# Patient Record
Sex: Male | Born: 1990 | Race: White | Hispanic: No | Marital: Single | Smoking: Current every day smoker
Health system: Southern US, Community
[De-identification: ages and names within clinical notes are randomized; demographics above are authoritative.]

## PROBLEM LIST (undated history)

## (undated) DIAGNOSIS — B1920 Unspecified viral hepatitis C without hepatic coma: Secondary | ICD-10-CM

## (undated) DIAGNOSIS — R569 Unspecified convulsions: Secondary | ICD-10-CM

## (undated) HISTORY — PX: HERNIA REPAIR: SHX51

## (undated) HISTORY — PX: FRACTURE SURGERY: SHX138

---
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
## 2012-03-27 ENCOUNTER — Emergency Department (HOSPITAL_COMMUNITY)
Admission: EM | Admit: 2012-03-27 | Discharge: 2012-03-27 | Disposition: A | Discharge status: Home / Self Care | Payer: Self-pay | Attending: Emergency Medicine | Admitting: Emergency Medicine

## 2012-03-27 ENCOUNTER — Encounter (HOSPITAL_COMMUNITY): Payer: Self-pay | Admitting: *Deleted

## 2012-03-27 DIAGNOSIS — H9202 Otalgia, left ear: Secondary | ICD-10-CM

## 2012-03-27 DIAGNOSIS — S00419A Abrasion of unspecified ear, initial encounter: Secondary | ICD-10-CM

## 2012-03-27 DIAGNOSIS — H9209 Otalgia, unspecified ear: Secondary | ICD-10-CM | POA: Insufficient documentation

## 2012-03-27 DIAGNOSIS — F17200 Nicotine dependence, unspecified, uncomplicated: Secondary | ICD-10-CM | POA: Insufficient documentation

## 2012-03-27 MED ORDER — CEPHALEXIN 500 MG PO CAPS
500.0000 mg | ORAL_CAPSULE | Freq: Once | ORAL | Status: AC
  Administered 2012-03-27: 500 mg via ORAL
  Filled 2012-03-27: qty 1

## 2012-03-27 MED ORDER — ANTIPYRINE-BENZOCAINE 5.4-1.4 % OT SOLN
3.0000 [drp] | Freq: Once | OTIC | Status: AC
  Administered 2012-03-27: 4 [drp] via OTIC
  Filled 2012-03-27: qty 10

## 2012-03-27 MED ORDER — CEPHALEXIN 250 MG PO CAPS: 250.0000 mg | ORAL_CAPSULE | Freq: Four times a day (QID) | ORAL | Status: AC

## 2012-03-27 MED ORDER — AMOXICILLIN 250 MG PO CAPS: 500.0000 mg | ORAL_CAPSULE | Freq: Once | ORAL | Status: DC

## 2012-03-27 NOTE — ED Provider Notes (Signed)
History     CSN: [id number]  Arrival date & time 03/27/12  1814   First MD Initiated Contact with Patient 03/27/12 1913      Chief Complaint  Patient presents with  . Otalgia    (Consider location/radiation/quality/duration/timing/severity/associated sxs/prior treatment) Patient is a 21 y.o. male presenting with ear pain. The history is provided by the patient.  Otalgia This is a new problem. The current episode started more than 2 days ago. There is pain in the left ear. The problem occurs constantly. The problem has not changed since onset.There has been no fever. The pain is moderate. Associated symptoms include ear discharge and headaches. Pertinent negatives include no hearing loss, no rhinorrhea, no sore throat, no vomiting, no neck pain, no cough and no rash. Associated symptoms comments: Single episode of bleeding from the left ear one week ago.  Reports occasional drainage from the ear.  Marland Kitchen    History reviewed. No pertinent past medical history.  History reviewed. No pertinent past surgical history.  History reviewed. No pertinent family history.  History  Substance Use Topics  . Smoking status: Current Everyday Smoker  . Smokeless tobacco: Not on file  . Alcohol Use: No      Review of Systems  Constitutional: Negative for fever, activity change and appetite change.  HENT: Positive for ear pain and ear discharge. Negative for hearing loss, congestion, sore throat, facial swelling, rhinorrhea, trouble swallowing, neck pain, neck stiffness, sinus pressure and tinnitus.   Respiratory: Negative for cough.   Gastrointestinal: Negative for vomiting.  Skin: Negative for rash.  Neurological: Positive for headaches. Negative for dizziness, syncope, facial asymmetry, weakness, light-headedness and numbness.  All other systems reviewed and are negative.    Allergies  Review of patient's allergies indicates no known allergies.  Home Medications   Current Outpatient Rx    Name Route Sig Dispense Refill  . ACETAMINOPHEN 500 MG PO TABS Oral Take 1,000 mg by mouth as needed. For pain      BP 126/80  Pulse 90  Temp 98.3 F (36.8 C) (Oral)  Resp 18  Ht 6\' 2"  (1.88 m)  Wt 150 lb (68.04 kg)  BMI 19.26 kg/m2  SpO2 100%  Physical Exam  Nursing note and vitals reviewed. Constitutional: He is oriented to person, place, and time. He appears well-developed and well-nourished. No distress.  HENT:  Head: Normocephalic.  Right Ear: Tympanic membrane and ear canal normal.  Left Ear: Tympanic membrane normal. There is tenderness. No drainage or swelling. No mastoid tenderness. Tympanic membrane is not perforated, not erythematous and not bulging.  Mouth/Throat: Uvula is midline, oropharynx is clear and moist and mucous membranes are normal.       Small papule to left ear canal with dried blood in the canal.  No TM perforation or active bleeding  Eyes: EOM are normal. Pupils are equal, round, and reactive to light.  Neck: Normal range of motion. Neck supple. No thyromegaly present.  Cardiovascular: Normal rate, regular rhythm and normal heart sounds.   No murmur heard. Pulmonary/Chest: Effort normal and breath sounds normal. No stridor.  Lymphadenopathy:    He has no cervical adenopathy.  Neurological: He is alert and oriented to person, place, and time. He exhibits normal muscle tone. Coordination normal.  Skin: Skin is warm and dry.  Psychiatric: He has a normal mood and affect.    ED Course  Procedures (including critical care time)  Labs Reviewed - No data to display No results found.  MDM    Small papule to the left ear canal with dried blood present.  No edema, active bleeding or obvious TM perforation.  Pt is well appearing.  Agrees to f/u with his PMD if needed.  The patient appears reasonably screened and/or stabilized for discharge and I doubt any other medical condition or other [facility] requiring further screening, evaluation, or treatment  in the ED at this time prior to discharge.    Prescribed: Auralgan otic slon (dispensed) keflex       Jamani Bearce L. [place], [place] 03/29/12 2101

## 2012-03-27 NOTE — ED Notes (Addendum)
Pain lt ear and headache for 1 week,  Blood came out of ear last week.

## 2012-03-31 NOTE — ED Provider Notes (Signed)
Medical screening examination/treatment/procedure(s) were performed by non-physician practitioner and as supervising physician I was immediately available for consultation/collaboration.  Flint Melter, MD 03/31/12 [phone]

## 2012-08-09 ENCOUNTER — Emergency Department (HOSPITAL_COMMUNITY)
Admission: EM | Admit: 2012-08-09 | Discharge: 2012-08-10 | Disposition: A | Discharge status: Home / Self Care | Payer: Self-pay | Attending: Emergency Medicine | Admitting: Emergency Medicine

## 2012-08-09 ENCOUNTER — Encounter (HOSPITAL_COMMUNITY): Payer: Self-pay | Admitting: Emergency Medicine

## 2012-08-09 DIAGNOSIS — S20219A Contusion of unspecified front wall of thorax, initial encounter: Secondary | ICD-10-CM | POA: Insufficient documentation

## 2012-08-09 DIAGNOSIS — Y929 Unspecified place or not applicable: Secondary | ICD-10-CM | POA: Insufficient documentation

## 2012-08-09 DIAGNOSIS — W172XXA Fall into hole, initial encounter: Secondary | ICD-10-CM | POA: Insufficient documentation

## 2012-08-09 DIAGNOSIS — Y939 Activity, unspecified: Secondary | ICD-10-CM | POA: Insufficient documentation

## 2012-08-09 DIAGNOSIS — F17200 Nicotine dependence, unspecified, uncomplicated: Secondary | ICD-10-CM | POA: Insufficient documentation

## 2012-08-09 NOTE — ED Notes (Signed)
I fell in a hole, about 7 feet deep yesterday.  I hit a piece of wood on my right side.  It hurts to breathe, eat per pt.

## 2012-08-10 ENCOUNTER — Emergency Department (HOSPITAL_COMMUNITY): Payer: Self-pay

## 2012-08-10 MED ORDER — NAPROXEN 500 MG PO TABS: 500.0000 mg | ORAL_TABLET | Freq: Two times a day (BID) | ORAL | Status: DC

## 2012-08-10 MED ORDER — NAPROXEN 250 MG PO TABS
500.0000 mg | ORAL_TABLET | Freq: Once | ORAL | Status: AC
  Administered 2012-08-10: 500 mg via ORAL
  Filled 2012-08-10: qty 2

## 2012-08-10 NOTE — ED Provider Notes (Signed)
History     CSN: [id number]  Arrival date & time 08/09/12  2229   First MD Initiated Contact with Patient 08/09/12 2358      Chief Complaint  Patient presents with  . Fall    (Consider location/radiation/quality/duration/timing/severity/associated sxs/prior treatment) HPI Comments: 21 year old male who states that yesterday he fell into a 7 foot deep old striking his right anterior lateral rib cage on a large piece of wood at the bottom of the hole. He had acute onset of pain which has been constant, worse with deep breathing but not associated with any bruising abrasions or lacerations of his chest wall. He has been ambulatory without difficulty and denies head injury, neck pain, back pain or extremity pain or injury.  Patient is a 21 y.o. male presenting with fall. The history is provided by the patient and a relative.  Fall    History reviewed. No pertinent past medical history.  History reviewed. No pertinent past surgical history.  No family history on file.  History  Substance Use Topics  . Smoking status: Current Every Day Smoker  . Smokeless tobacco: Not on file  . Alcohol Use: No      Review of Systems  All other systems reviewed and are negative.    Allergies  Review of patient's allergies indicates no known allergies.  Home Medications   Current Outpatient Rx  Name  Route  Sig  Dispense  Refill  . ACETAMINOPHEN 500 MG PO TABS   Oral   Take 1,000 mg by mouth as needed. For pain         . NAPROXEN 500 MG PO TABS   Oral   Take 1 tablet (500 mg total) by mouth 2 (two) times daily with a meal.   30 tablet   0     BP 137/83  Pulse 57  Temp 98.8 F (37.1 C) (Tympanic)  Resp 16  Ht 6\' 2"  (1.88 m)  Wt 160 lb (72.576 kg)  BMI 20.54 kg/m2  SpO2 100%  Physical Exam  Nursing note and vitals reviewed. Constitutional: He appears well-developed and well-nourished. No distress.  HENT:  Head: Normocephalic and atraumatic.  Mouth/Throat:  Oropharynx is clear and moist. No oropharyngeal exudate.       No head injury  no facial tenderness, deformity, malocclusion or hemotympanum.  no battle's sign or racoon eyes.   Eyes: Conjunctivae normal and EOM are normal. Pupils are equal, round, and reactive to light. Right eye exhibits no discharge. Left eye exhibits no discharge. No scleral icterus.  Neck: Normal range of motion. Neck supple. No JVD present. No thyromegaly present.  Cardiovascular: Normal rate, regular rhythm, normal heart sounds and intact distal pulses.  Exam reveals no gallop and no friction rub.   No murmur heard. Pulmonary/Chest: Effort normal and breath sounds normal. No respiratory distress. He has no wheezes. He has no rales. He exhibits tenderness ( focal mild to moderate chest tenderness over the right midaxillary line and anterior axillary line a proximal sixth rib level.).       Normal underlying lung sounds  Abdominal: Soft. Bowel sounds are normal. He exhibits no distension and no mass. There is no tenderness.  Musculoskeletal: Normal range of motion. He exhibits no edema and no tenderness.  Lymphadenopathy:    He has no cervical adenopathy.  Neurological: He is alert. Coordination normal.  Skin: Skin is warm and dry. No rash noted. No erythema.       No abrasions, lacerations or hematomas to  the chest wall, no subcutaneous emphysema  Psychiatric: He has a normal mood and affect. His behavior is normal.    ED Course  Procedures (including critical care time)  Labs Reviewed - No data to display Dg Ribs Unilateral W/chest Right  08/10/2012  *RADIOLOGY REPORT*  Clinical Data: Right rib pain status post trauma.  RIGHT RIBS AND CHEST - 3+ VIEW  Comparison: 10/01/2006  Findings: Lungs are predominately clear.  Cardiomediastinal contours within normal range.  No displaced fracture identified.  IMPRESSION: No displaced rib fracture identified.   Original Report Authenticated By: Jearld Lesch, M.D.       1. Contusion of rib       MDM  The patient overall appears well, he has no hypoxia, normal respiratory work of breathing, normal pulse and normal breath sounds, rule out rib fractures with imaging, Naprosyn ordered for pain.   Well-appearing, I have personally interpreted the right rib x-rays along with the posterior anterior view of the chest and find her to be no signs of pneumothorax, no signs of fracture. Patient has been informed of these results, he will be given an anti-inflammatory for home.     Vida Roller, MD 08/10/12 0200

## 2012-09-04 ENCOUNTER — Emergency Department (HOSPITAL_COMMUNITY)
Admission: EM | Admit: 2012-09-04 | Discharge: 2012-09-04 | Disposition: A | Discharge status: Home / Self Care | Payer: Self-pay | Attending: Emergency Medicine | Admitting: Emergency Medicine

## 2012-09-04 ENCOUNTER — Encounter (HOSPITAL_COMMUNITY): Payer: Self-pay

## 2012-09-04 DIAGNOSIS — K0889 Other specified disorders of teeth and supporting structures: Secondary | ICD-10-CM

## 2012-09-04 DIAGNOSIS — K089 Disorder of teeth and supporting structures, unspecified: Secondary | ICD-10-CM | POA: Insufficient documentation

## 2012-09-04 DIAGNOSIS — F17200 Nicotine dependence, unspecified, uncomplicated: Secondary | ICD-10-CM | POA: Insufficient documentation

## 2012-09-04 MED ORDER — HYDROCODONE-ACETAMINOPHEN 5-325 MG PO TABS
2.0000 | ORAL_TABLET | Freq: Once | ORAL | Status: AC
  Administered 2012-09-04: 2 via ORAL
  Filled 2012-09-04: qty 2

## 2012-09-04 MED ORDER — PENICILLIN V POTASSIUM 500 MG PO TABS: 500.0000 mg | ORAL_TABLET | Freq: Four times a day (QID) | ORAL | Status: AC

## 2012-09-04 MED ORDER — PENICILLIN V POTASSIUM 250 MG PO TABS
500.0000 mg | ORAL_TABLET | Freq: Once | ORAL | Status: AC
  Administered 2012-09-04: 500 mg via ORAL
  Filled 2012-09-04: qty 2

## 2012-09-04 MED ORDER — HYDROCODONE-ACETAMINOPHEN 5-325 MG PO TABS: ORAL_TABLET | ORAL | Status: DC

## 2012-09-04 NOTE — ED Notes (Signed)
"  i have 7 or 8 teeth that are killing me"  Right upper and lower pain

## 2012-09-04 NOTE — ED Notes (Signed)
Pt with upper and lower right teeth with pain x 3-4 months per pt that comes and goes, denies seeing a dentist due to no insurance and attempted to call health dept today but was closed by time pt got home from work per pt

## 2012-09-04 NOTE — ED Provider Notes (Signed)
History     CSN: [id number]  Arrival date & time 09/04/12  2043   First MD Initiated Contact with Patient 09/04/12 2118      Chief Complaint  Patient presents with  . Dental Pain    (Consider location/radiation/quality/duration/timing/severity/associated sxs/prior treatment) Patient is a 22 y.o. male presenting with tooth pain. The history is provided by the patient.  Dental PainThe primary symptoms include mouth pain. Primary symptoms do not include dental injury, oral bleeding, oral lesions, headaches, fever, shortness of breath, sore throat, angioedema or cough. The symptoms began more than 1 week ago. The symptoms are worsening. The symptoms are new. The symptoms occur constantly.  Mouth pain began more than 1 week ago. Mouth pain occurs constantly. Mouth pain is worsening. Affected locations include: teeth and gum(s).  Additional symptoms include: dental sensitivity to temperature, gum swelling and gum tenderness. Additional symptoms do not include: purulent gums, trismus, jaw pain, facial swelling, trouble swallowing, pain with swallowing, drooling, ear pain and swollen glands. Medical issues include: smoking and periodontal disease.    History reviewed. No pertinent past medical history.  History reviewed. No pertinent past surgical history.  No family history on file.  History  Substance Use Topics  . Smoking status: Current Every Day Smoker -- 1.0 packs/day    Types: Cigarettes  . Smokeless tobacco: Not on file  . Alcohol Use: No      Review of Systems  Constitutional: Negative for fever and appetite change.  HENT: Positive for dental problem. Negative for ear pain, congestion, sore throat, facial swelling, drooling, trouble swallowing, neck pain and neck stiffness.   Eyes: Negative for pain and visual disturbance.  Respiratory: Negative for cough and shortness of breath.   Neurological: Negative for dizziness, facial asymmetry and headaches.  Hematological:  Negative for adenopathy.  All other systems reviewed and are negative.    Allergies  Review of patient's allergies indicates no known allergies.  Home Medications   Current Outpatient Rx  Name  Route  Sig  Dispense  Refill  . ACETAMINOPHEN 500 MG PO TABS   Oral   Take 1,000 mg by mouth as needed. For pain         . HYDROCODONE-ACETAMINOPHEN 5-325 MG PO TABS   Oral   Take 1 tablet by mouth every 6 (six) hours as needed.           BP 129/96  Pulse 75  Temp 98.1 F (36.7 C) (Oral)  Resp 20  Ht 6\' 3"  (1.905 m)  Wt 160 lb (72.576 kg)  BMI 20.00 kg/m2  SpO2 100%  Physical Exam  Nursing note and vitals reviewed. Constitutional: He is oriented to person, place, and time. He appears well-developed and well-nourished. No distress.  HENT:  Head: Normocephalic and atraumatic. No trismus in the jaw.  Right Ear: Tympanic membrane and ear canal normal.  Left Ear: Tympanic membrane and ear canal normal.  Mouth/Throat: Uvula is midline, oropharynx is clear and moist and mucous membranes are normal. Dental caries present. No dental abscesses or uvula swelling.         Widespread dental decay and ttp of the surrounding gums.  Mild erythema of the gums.  No obvious dental abscess, trismus or facial edema  Neck: Normal range of motion. Neck supple.  Cardiovascular: Normal rate, regular rhythm, normal heart sounds and intact distal pulses.   No murmur heard. Pulmonary/Chest: Effort normal and breath sounds normal.  Musculoskeletal: Normal range of motion.  Lymphadenopathy:    He  has no cervical adenopathy.  Neurological: He is alert and oriented to person, place, and time. He exhibits normal muscle tone. Coordination normal.  Skin: Skin is warm and dry.    ED Course  Procedures (including critical care time)  Labs Reviewed - No data to display No results found.      MDM    Pt agrees to f/u with his dentist this week.    Prescribed: norco #15 Pen  VK      Schon Zeiders L. [place], [place] 09/04/12 2143

## 2012-09-04 NOTE — ED Provider Notes (Signed)
Medical screening examination/treatment/procedure(s) were performed by non-physician practitioner and as supervising physician I was immediately available for consultation/collaboration.   Demetrias Goodbar L Othel Dicostanzo, MD 09/04/12 2218 

## 2013-01-30 ENCOUNTER — Encounter (HOSPITAL_COMMUNITY): Payer: Self-pay

## 2013-01-30 ENCOUNTER — Emergency Department (HOSPITAL_COMMUNITY)
Admission: EM | Admit: 2013-01-30 | Discharge: 2013-01-31 | Discharge status: Against Medical Advice | Payer: Self-pay | Attending: Emergency Medicine | Admitting: Emergency Medicine

## 2013-01-30 DIAGNOSIS — R569 Unspecified convulsions: Secondary | ICD-10-CM | POA: Insufficient documentation

## 2013-01-30 DIAGNOSIS — F17200 Nicotine dependence, unspecified, uncomplicated: Secondary | ICD-10-CM | POA: Insufficient documentation

## 2013-01-30 NOTE — ED Provider Notes (Signed)
History     CSN: [id number]  Arrival date & time 01/30/13  2324   First MD Initiated Contact with Patient 01/30/13 2334      Chief Complaint  Patient presents with  . Loss of Consciousness    (Consider location/radiation/quality/duration/timing/severity/associated sxs/prior treatment) HPI HPI Comments: George Lucas is a 22 y.o. male brought in by ambulance, who presents to the Emergency Department complaining of seizure. Patient has not had a seizure since he was small. He snorted vidocin earlier tonight and took some xanax. He had a seizure. His neck is hurting when he moves his head.   History reviewed. No pertinent past medical history.  History reviewed. No pertinent past surgical history.  No family history on file.  History  Substance Use Topics  . Smoking status: Current Every Day Smoker -- 0.50 packs/day for 10 years    Types: Cigarettes  . Smokeless tobacco: Not on file  . Alcohol Use: No      Review of Systems  Constitutional: Negative for fever.       10 Systems reviewed and are negative for acute change except as noted in the HPI.  HENT: Negative for congestion.   Eyes: Negative for discharge and redness.  Respiratory: Negative for cough and shortness of breath.   Cardiovascular: Negative for chest pain.  Gastrointestinal: Negative for vomiting and abdominal pain.  Musculoskeletal: Negative for back pain.  Skin: Negative for rash.  Neurological: Positive for seizures. Negative for syncope, numbness and headaches.  Psychiatric/Behavioral:       No behavior change.    Allergies  Review of patient's allergies indicates no known allergies.  Home Medications   Current Outpatient Rx  Name  Route  Sig  Dispense  Refill  . acetaminophen (TYLENOL) 500 MG tablet   Oral   Take 1,000 mg by mouth as needed. For pain         . HYDROcodone-acetaminophen (NORCO/VICODIN) 5-325 MG per tablet   Oral   Take 1 tablet by mouth every 6 (six) hours as  needed.         Marland Kitchen HYDROcodone-acetaminophen (NORCO/VICODIN) 5-325 MG per tablet      Take one-two tabs po q 4-6 hrs prn pain   15 tablet   0     BP 119/70  Pulse 80  Temp(Src) 98.1 F (36.7 C) (Oral)  Resp 20  Ht 6\' 3"  (1.905 m)  Wt 160 lb (72.576 kg)  BMI 20 kg/m2  SpO2 99%  Physical Exam  Nursing note and vitals reviewed. Constitutional: He appears well-developed and well-nourished.  Awake, alert, nontoxic appearance.  HENT:  Head: Normocephalic and atraumatic.  Eyes: EOM are normal. Pupils are equal, round, and reactive to light.  Neck: Neck supple.  Cardiovascular: Normal rate.   Pulmonary/Chest: Effort normal and breath sounds normal. He exhibits no tenderness.  Abdominal: Soft. Bowel sounds are normal. There is no tenderness. There is no rebound.  Musculoskeletal: He exhibits no tenderness.  Baseline ROM, no obvious new focal weakness.  Neurological:  Mental status and motor strength appears baseline for patient and situation.  Skin: No rash noted.  Psychiatric: He has a normal mood and affect.    ED Course  Procedures (including critical care time) Results for orders placed during the hospital encounter of 01/30/13  URINE RAPID DRUG SCREEN (HOSP PERFORMED)      Result Value Range   Opiates POSITIVE (*) NONE DETECTED   Cocaine NONE DETECTED  NONE DETECTED   Benzodiazepines POSITIVE (*)  NONE DETECTED   Amphetamines NONE DETECTED  NONE DETECTED   Tetrahydrocannabinol POSITIVE (*) NONE DETECTED   Barbiturates NONE DETECTED  NONE DETECTED  ETHANOL      Result Value Range   Alcohol, Ethyl (B) <11  0 - 11 mg/dL         MDM  Patient with a seizure disorder as a child had a seizure tonight in the setting of using drugs. He did not stay for his CT of his head and neck. He left AMA.  discussed risk of death/disability of leaving against medical advice and the patient accepts these risks.  The patient is awake/alet able to make decisions, and not  intoxicated Patient discharged against medical advice.  MDM Reviewed: nursing note and vitals Interpretation: labs           Nicoletta Dress. [place], MD 01/31/13 1610

## 2013-01-30 NOTE — ED Notes (Signed)
Per rescue, pt found in his parents home, lethargic, tremulous. Admitted drug use today of  1-norco10mg , and 2mg  xanax. girlfriend say he had a seizure. Pt states he's unsure what happened. Rescue reports copius intoxicants on all other persons in home.

## 2013-01-31 ENCOUNTER — Emergency Department (HOSPITAL_COMMUNITY): Payer: Self-pay

## 2013-01-31 LAB — RAPID URINE DRUG SCREEN, HOSP PERFORMED
Amphetamines: NOT DETECTED
Barbiturates: NOT DETECTED
Benzodiazepines: POSITIVE — AB
Opiates: POSITIVE — AB
Tetrahydrocannabinol: POSITIVE — AB

## 2013-01-31 LAB — ETHANOL: Alcohol, Ethyl (B): <11 mg/dL (ref 0–11)

## 2013-01-31 NOTE — ED Notes (Signed)
Pt has refused to wait any longer. States his neck does not hurt anymore and he has to go to work in the morning. I explained what AMA is and the risk of death and/or disability from an undetermined cause. Also informed pt that he may return at anytime for any reason. Pt signed AMA form. (downtime). Instructed pt to increase fluid intake today. NO drugs/alcohol. return for recurrent loss of consciousness , dizziness, n/v etc. Left easily ambulatory with girlfriend

## 2013-02-03 DIAGNOSIS — R569 Unspecified convulsions: Secondary | ICD-10-CM

## 2013-06-18 ENCOUNTER — Encounter (HOSPITAL_COMMUNITY): Payer: Self-pay | Admitting: Emergency Medicine

## 2013-06-18 ENCOUNTER — Emergency Department (HOSPITAL_COMMUNITY)
Admission: EM | Admit: 2013-06-18 | Discharge: 2013-06-18 | Disposition: A | Discharge status: Home / Self Care | Payer: Self-pay | Attending: Emergency Medicine | Admitting: Emergency Medicine

## 2013-06-18 DIAGNOSIS — K029 Dental caries, unspecified: Secondary | ICD-10-CM

## 2013-06-18 DIAGNOSIS — K089 Disorder of teeth and supporting structures, unspecified: Secondary | ICD-10-CM | POA: Insufficient documentation

## 2013-06-18 DIAGNOSIS — F17200 Nicotine dependence, unspecified, uncomplicated: Secondary | ICD-10-CM | POA: Insufficient documentation

## 2013-06-18 MED ORDER — OXYCODONE-ACETAMINOPHEN 5-325 MG PO TABS
1.0000 | ORAL_TABLET | Freq: Once | ORAL | Status: AC
  Administered 2013-06-18: 1 via ORAL
  Filled 2013-06-18: qty 1

## 2013-06-18 MED ORDER — NAPROXEN 500 MG PO TABS: 500.0000 mg | ORAL_TABLET | Freq: Two times a day (BID) | ORAL | Status: DC

## 2013-06-18 MED ORDER — AMOXICILLIN 250 MG PO CAPS
500.0000 mg | ORAL_CAPSULE | Freq: Once | ORAL | Status: AC
Start: 2013-06-18 — End: 2013-06-18
  Administered 2013-06-18: 500 mg via ORAL
  Filled 2013-06-18: qty 2

## 2013-06-18 MED ORDER — AMOXICILLIN 500 MG PO CAPS: 500.0000 mg | ORAL_CAPSULE | Freq: Three times a day (TID) | ORAL | Status: DC

## 2013-06-18 NOTE — ED Notes (Signed)
Mult dental caries, but increased pain in upper front teeth.

## 2013-06-18 NOTE — ED Provider Notes (Signed)
CSN: [id number]     Arrival date & time 06/18/13  1534 History   First MD Initiated Contact with Patient 06/18/13 1600     Chief Complaint  Patient presents with  . Dental Pain   (Consider location/radiation/quality/duration/timing/severity/associated sxs/prior Treatment) Patient is a 22 y.o. male presenting with tooth pain. The history is provided by the patient.  Dental Pain Location:  Upper Upper teeth location:  7/RU lateral incisor, 6/RU cuspid and 5/RU 1st bicuspid Quality:  Throbbing Severity:  Severe Onset quality:  Gradual Duration:  3 months Timing:  Constant Progression:  Worsening Chronicity:  Chronic Context: dental caries and poor dentition   Relieved by:  Nothing Worsened by:  Cold food/drink Associated symptoms: gum swelling   Risk factors: smoking    EBB CARELOCK is a 22 y.o. male who presents to the ED with dental pain. The pain has been going on x 3 months. He took 2 ibuprofen last night and 2 this morning. He does not have money to see a Education officer, community.  History reviewed. No pertinent past medical history. Past Surgical History  Procedure Laterality Date  . Fracture surgery     History reviewed. No pertinent family history. History  Substance Use Topics  . Smoking status: Current Every Day Smoker -- 0.50 packs/day for 10 years    Types: Cigarettes  . Smokeless tobacco: Not on file  . Alcohol Use: No    Review of Systems  Allergies  Review of patient's allergies indicates no known allergies.  Home Medications   Current Outpatient Rx  Name  Route  Sig  Dispense  Refill  . acetaminophen (TYLENOL) 500 MG tablet   Oral   Take 1,000 mg by mouth as needed. For pain         . HYDROcodone-acetaminophen (NORCO/VICODIN) 5-325 MG per tablet   Oral   Take 1 tablet by mouth every 6 (six) hours as needed.         Marland Kitchen HYDROcodone-acetaminophen (NORCO/VICODIN) 5-325 MG per tablet      Take one-two tabs po q 4-6 hrs prn pain   15 tablet   0    BP  111/75  Pulse 129  Temp(Src) 98.8 F (37.1 C) (Oral)  Resp 20  Ht 6\' 2"  (1.88 m)  Wt 160 lb (72.576 kg)  BMI 20.53 kg/m2  SpO2 100% Physical Exam  Nursing note and vitals reviewed. Constitutional: He is oriented to person, place, and time. He appears well-developed and well-nourished. No distress.  HENT:  Mouth/Throat: Uvula is midline, oropharynx is clear and moist and mucous membranes are normal.    Multiple dental caries. Tender upper right dental area.  Eyes: EOM are normal.  Neck: Neck supple.  Pulmonary/Chest: Effort normal.  Abdominal: Soft. There is no tenderness.  Musculoskeletal: Normal range of motion.  Neurological: He is alert and oriented to person, place, and time. No cranial nerve deficit.  Skin: Skin is warm and dry.    ED Course  Procedures MDM  22 y.o. male with chronic dental pain and decay. Will treat with antibiotics and pain medication and have patient follow up with the dental clinic as soon as possible. Patient stable for discharge home without any immediate complications.  Discussed with the patient and all questioned fully answered.   Medication List    STOP taking these medications       ibuprofen 200 MG tablet  Commonly known as:  ADVIL,MOTRIN      TAKE these medications  amoxicillin 500 MG capsule  Commonly known as:  AMOXIL  Take 1 capsule (500 mg total) by mouth 3 (three) times daily.     naproxen 500 MG tablet  Commonly known as:  NAPROSYN  Take 1 tablet (500 mg total) by mouth 2 (two) times daily.      ASK your doctor about these medications       acetaminophen 500 MG tablet  Commonly known as:  TYLENOL  Take 1,000 mg by mouth as needed. For pain            Janne Napoleon, NP 06/18/13 1700

## 2013-06-19 NOTE — Progress Notes (Signed)
ED/CM noted patient did not have health insurance and/or PCP listed in the computer.  Patient was given the [place] resource handout with information on the clinics, food pantries, and the handout for new health insurance sign-up.  Patient expressed appreciation for this. 

## 2013-06-19 NOTE — ED Provider Notes (Signed)
Medical screening examination/treatment/procedure(s) were performed by non-physician practitioner and as supervising physician I was immediately available for consultation/collaboration.  EKG Interpretation   None         Lorelee Mclaurin M Onesha Krebbs, MD 06/19/13 0027 

## 2013-07-30 ENCOUNTER — Encounter (HOSPITAL_COMMUNITY): Payer: Self-pay | Admitting: Emergency Medicine

## 2013-07-30 ENCOUNTER — Emergency Department (HOSPITAL_COMMUNITY)
Admission: EM | Admit: 2013-07-30 | Discharge: 2013-07-30 | Disposition: A | Discharge status: Home / Self Care | Payer: Self-pay | Attending: Emergency Medicine | Admitting: Emergency Medicine

## 2013-07-30 DIAGNOSIS — K1370 Unspecified lesions of oral mucosa: Secondary | ICD-10-CM | POA: Insufficient documentation

## 2013-07-30 DIAGNOSIS — K029 Dental caries, unspecified: Secondary | ICD-10-CM | POA: Insufficient documentation

## 2013-07-30 DIAGNOSIS — F17200 Nicotine dependence, unspecified, uncomplicated: Secondary | ICD-10-CM | POA: Insufficient documentation

## 2013-07-30 DIAGNOSIS — K047 Periapical abscess without sinus: Secondary | ICD-10-CM | POA: Insufficient documentation

## 2013-07-30 DIAGNOSIS — G8929 Other chronic pain: Secondary | ICD-10-CM | POA: Insufficient documentation

## 2013-07-30 DIAGNOSIS — Z8669 Personal history of other diseases of the nervous system and sense organs: Secondary | ICD-10-CM | POA: Insufficient documentation

## 2013-07-30 HISTORY — DX: Unspecified convulsions: R56.9

## 2013-07-30 MED ORDER — HYDROCODONE-ACETAMINOPHEN 5-325 MG PO TABS
1.0000 | ORAL_TABLET | Freq: Once | ORAL | Status: AC
  Administered 2013-07-30: 1 via ORAL
  Filled 2013-07-30: qty 1

## 2013-07-30 MED ORDER — NAPROXEN 500 MG PO TABS: 500.0000 mg | ORAL_TABLET | Freq: Two times a day (BID) | ORAL | Status: DC

## 2013-07-30 MED ORDER — AMOXICILLIN 500 MG PO CAPS: 500.0000 mg | ORAL_CAPSULE | Freq: Three times a day (TID) | ORAL | Status: DC

## 2013-07-30 NOTE — ED Notes (Signed)
Pt states has had tooth ache for the last 2 days.

## 2013-07-30 NOTE — ED Provider Notes (Signed)
CSN: [id number]     Arrival date & time 07/30/13  1540 History   First MD Initiated Contact with Patient 07/30/13 1604     Chief Complaint  Patient presents with  . Dental Pain   (Consider location/radiation/quality/duration/timing/severity/associated sxs/prior Treatment) Patient is a 22 y.o. male presenting with tooth pain. The history is provided by the patient.  Dental Pain Location:  Upper Upper teeth location:  6/RU cuspid Quality:  Throbbing and constant Severity:  Severe Onset quality:  Gradual Duration:  2 days Timing:  Constant Progression:  Worsening Chronicity:  Chronic Context: abscess   Relieved by:  Nothing Worsened by:  Cold food/drink, touching and pressure Ineffective treatments:  NSAIDs Associated symptoms: gum swelling   Associated symptoms: no difficulty swallowing and no trismus      Past Medical History  Diagnosis Date  . Seizures    Past Surgical History  Procedure Laterality Date  . Fracture surgery     Family History  Problem Relation Age of Onset  . Seizures Mother   . Seizures Father    History  Substance Use Topics  . Smoking status: Current Every Day Smoker -- 0.50 packs/day for 10 years    Types: Cigarettes  . Smokeless tobacco: Not on file  . Alcohol Use: 0.6 oz/week    1 Cans of beer per week     Comment: Occasional    Review of Systems Negative except as stated in HPI  Allergies  Review of patient's allergies indicates no known allergies.  Home Medications   Current Outpatient Rx  Name  Route  Sig  Dispense  Refill  . acetaminophen (TYLENOL) 500 MG tablet   Oral   Take 1,000 mg by mouth as needed. For pain         . amoxicillin (AMOXIL) 500 MG capsule   Oral   Take 1 capsule (500 mg total) by mouth 3 (three) times daily.   21 capsule   0   . naproxen (NAPROSYN) 500 MG tablet   Oral   Take 1 tablet (500 mg total) by mouth 2 (two) times daily.   20 tablet   0    BP 119/77  Pulse 76  Temp(Src) 98.4 F (36.9  C) (Oral)  Resp 16  Ht 6' (1.829 m)  Wt 165 lb (74.844 kg)  BMI 22.37 kg/m2  SpO2 99% Physical Exam  Nursing note and vitals reviewed. Constitutional: He is oriented to person, place, and time. He appears well-developed and well-nourished.  HENT:  Head: Normocephalic and atraumatic.  Mouth/Throat: Uvula is midline, oropharynx is clear and moist and mucous membranes are normal. Dental abscesses and dental caries present.    Eyes: Conjunctivae and EOM are normal.  Neck: Neck supple.  Cardiovascular: Normal rate and regular rhythm.   Pulmonary/Chest: Effort normal and breath sounds normal.  Musculoskeletal: Normal range of motion.  Lymphadenopathy:    He has no cervical adenopathy.  Neurological: He is alert and oriented to person, place, and time. No cranial nerve deficit.  Skin: Skin is warm and dry.  Psychiatric: He has a normal mood and affect. His behavior is normal.    ED Course  Procedures  MDM  22 y.o. male with dental abscess. Discussed with the patient need for follow up with dentist as soon as possible. Will treat for infection and pain. Patient stable for discharge without any immediate complications.  Discussed with the patient and all questioned fully answered.   Medication List    TAKE these medications  amoxicillin 500 MG capsule  Commonly known as:  AMOXIL  Take 1 capsule (500 mg total) by mouth 3 (three) times daily.     naproxen 500 MG tablet  Commonly known as:  NAPROSYN  Take 1 tablet (500 mg total) by mouth 2 (two) times daily.      ASK your doctor about these medications       acetaminophen 500 MG tablet  Commonly known as:  TYLENOL  Take 1,000 mg by mouth as needed. For pain           Janne Napoleon, NP 07/30/13 [phone]

## 2013-07-30 NOTE — ED Notes (Signed)
Pain rt upper front tooth, Pt has mult dental caries ,

## 2013-08-01 NOTE — ED Provider Notes (Signed)
Medical screening examination/treatment/procedure(s) were performed by non-physician practitioner and as supervising physician I was immediately available for consultation/collaboration.  EKG Interpretation   None         Serenity Batley M Emmylou Bieker, DO 08/01/13 1735 

## 2013-12-22 ENCOUNTER — Emergency Department (HOSPITAL_COMMUNITY): Payer: Self-pay

## 2013-12-22 ENCOUNTER — Emergency Department (HOSPITAL_COMMUNITY)
Admission: EM | Admit: 2013-12-22 | Discharge: 2013-12-22 | Disposition: A | Discharge status: Home / Self Care | Payer: Self-pay | Attending: Emergency Medicine | Admitting: Emergency Medicine

## 2013-12-22 ENCOUNTER — Encounter (HOSPITAL_COMMUNITY): Payer: Self-pay | Admitting: Emergency Medicine

## 2013-12-22 DIAGNOSIS — Z8669 Personal history of other diseases of the nervous system and sense organs: Secondary | ICD-10-CM | POA: Insufficient documentation

## 2013-12-22 DIAGNOSIS — S92912A Unspecified fracture of left toe(s), initial encounter for closed fracture: Secondary | ICD-10-CM

## 2013-12-22 DIAGNOSIS — W2209XA Striking against other stationary object, initial encounter: Secondary | ICD-10-CM | POA: Insufficient documentation

## 2013-12-22 DIAGNOSIS — Y92009 Unspecified place in unspecified non-institutional (private) residence as the place of occurrence of the external cause: Secondary | ICD-10-CM | POA: Insufficient documentation

## 2013-12-22 DIAGNOSIS — Y9301 Activity, walking, marching and hiking: Secondary | ICD-10-CM | POA: Insufficient documentation

## 2013-12-22 DIAGNOSIS — S92919A Unspecified fracture of unspecified toe(s), initial encounter for closed fracture: Secondary | ICD-10-CM | POA: Insufficient documentation

## 2013-12-22 DIAGNOSIS — F17200 Nicotine dependence, unspecified, uncomplicated: Secondary | ICD-10-CM | POA: Insufficient documentation

## 2013-12-22 DIAGNOSIS — Z8781 Personal history of (healed) traumatic fracture: Secondary | ICD-10-CM | POA: Insufficient documentation

## 2013-12-22 MED ORDER — DICLOFENAC SODIUM 75 MG PO TBEC: 75.0000 mg | DELAYED_RELEASE_TABLET | Freq: Two times a day (BID) | ORAL | Status: DC

## 2013-12-22 MED ORDER — ONDANSETRON HCL 4 MG PO TABS
4.0000 mg | ORAL_TABLET | Freq: Once | ORAL | Status: AC
  Administered 2013-12-22: 4 mg via ORAL
  Filled 2013-12-22: qty 1

## 2013-12-22 MED ORDER — HYDROCODONE-ACETAMINOPHEN 7.5-325 MG PO TABS: 1.0000 | ORAL_TABLET | ORAL | Status: DC | PRN

## 2013-12-22 MED ORDER — KETOROLAC TROMETHAMINE 10 MG PO TABS
10.0000 mg | ORAL_TABLET | Freq: Once | ORAL | Status: AC
  Administered 2013-12-22: 10 mg via ORAL
  Filled 2013-12-22: qty 1

## 2013-12-22 MED ORDER — HYDROCODONE-ACETAMINOPHEN 5-325 MG PO TABS
2.0000 | ORAL_TABLET | Freq: Once | ORAL | Status: AC
  Administered 2013-12-22: 2 via ORAL
  Filled 2013-12-22: qty 2

## 2013-12-22 NOTE — ED Provider Notes (Signed)
CSN: [id number]     Arrival date & time 12/22/13  0830 History   First MD Initiated Contact with Patient 12/22/13 [phone]     Chief Complaint  Patient presents with  . Toe Injury     (Consider location/radiation/quality/duration/timing/severity/associated sxs/prior Treatment) HPI Comments: Patient is a 23 year old male who presents to the emergency department with complaint of toe pain involving the right foot. The patient states that on last evening he was walking through the house barefooted. He accidentally hit his toe on a heater, and has had pain and swelling since that time. The patient states this morning the pain of the toe felt like it was throbbing and fell on the toe was" coming off". The patient presents now for evaluation of this until pain. The patient denies being on any anticoagulation medications, he denies any bleeding disorders. He has not had previous surgery on this time. He is  The history is provided by the patient.    Past Medical History  Diagnosis Date  . Seizures    Past Surgical History  Procedure Laterality Date  . Fracture surgery     Family History  Problem Relation Age of Onset  . Seizures Mother   . Seizures Father    History  Substance Use Topics  . Smoking status: Current Every Day Smoker -- 0.50 packs/day for 10 years    Types: Cigarettes  . Smokeless tobacco: Not on file  . Alcohol Use: 0.6 oz/week    1 Cans of beer per week     Comment: Occasional    Review of Systems  Constitutional: Negative for activity change.       All ROS Neg except as noted in HPI  HENT: Negative for nosebleeds.   Eyes: Negative for photophobia and discharge.  Respiratory: Negative for cough, shortness of breath and wheezing.   Cardiovascular: Negative for chest pain and palpitations.  Gastrointestinal: Negative for abdominal pain and blood in stool.  Genitourinary: Negative for dysuria, frequency and hematuria.  Musculoskeletal: Negative for arthralgias, back  pain and neck pain.  Skin: Negative.   Neurological: Positive for seizures. Negative for dizziness and speech difficulty.  Psychiatric/Behavioral: Negative for hallucinations and confusion.      Allergies  Review of patient's allergies indicates no known allergies.  Home Medications   Prior to Admission medications   Medication Sig Start Date End Date Taking? Authorizing Provider  QUEtiapine (SEROQUEL) 50 MG tablet Take 100 mg by mouth at bedtime.   Yes Historical Provider, MD   BP 104/87  Pulse 73  Temp(Src) 97.9 F (36.6 C) (Oral)  Resp 18  SpO2 100% Physical Exam  Nursing note and vitals reviewed. Constitutional: He is oriented to person, place, and time. He appears well-developed and well-nourished.  Non-toxic appearance.  HENT:  Head: Normocephalic.  Right Ear: Tympanic membrane and external ear normal.  Left Ear: Tympanic membrane and external ear normal.  Eyes: EOM and lids are normal. Pupils are equal, round, and reactive to light.  Neck: Normal range of motion. Neck supple. Carotid bruit is not present.  Cardiovascular: Normal rate, regular rhythm, normal heart sounds, intact distal pulses and normal pulses.   Pulmonary/Chest: Breath sounds normal. No respiratory distress.  Abdominal: Soft. Bowel sounds are normal. There is no tenderness. There is no guarding.  Musculoskeletal: Normal range of motion.  There is full range of motion of the right hip knee and ankle. The Achilles tendon is intact. There is swelling of the right first toe. There is increased  redness of the right first toe. There is pain to palpation and even more pain with attempted movement of the first toe. The dorsalis pedis pulses 2+ bilaterally.  Lymphadenopathy:       Head (right side): No submandibular adenopathy present.       Head (left side): No submandibular adenopathy present.    He has no cervical adenopathy.  Neurological: He is alert and oriented to person, place, and time. He has normal  strength. No cranial nerve deficit or sensory deficit.  Skin: Skin is warm and dry.  Psychiatric: He has a normal mood and affect. His speech is normal.    ED Course  FRACTURE CARE RIGHT FIRST TOE.  Patient identified by arm band. Permission for the procedure was given by the patient. The x-ray of the first toe was reviewed with the patient. I showed the patient and discussed the fracture with the patient in terms which he understood. The procedure for buddy tape and fracture control was then discussed in terms which the patient understood.  The first and second toe of the right foot were buddy taped with the drill and roll gauze. A Watson-Jones dressing was then applied. The patient was then fitted with a postoperative shoe. Crutches were offered to the patient.  Prescription for diclofenac and Norco given to the patient for assistance with his pain. Ice pack was provided. Patient was advised on how to keep his foot elevated above his waist, and he will have followup by orthopedics. The patient tolerated the procedure without problem.   Procedures (including critical care time) Labs Review Labs Reviewed - No data to display  Imaging Review Dg Toe Great Right  12/22/2013   CLINICAL DATA:  Right great toe injury.  Pain and swelling.  EXAM: RIGHT GREAT TOE  COMPARISON:  None.  FINDINGS: A nondisplaced, intra-articular fracture of the distal aspect of the great toe proximal phalanx is suspected on the AP image. There is no dislocation. The joint spaces are preserved. There is great toe soft tissue swelling.  IMPRESSION: Suspected nondisplaced fracture of the great toe proximal phalanx.   Electronically Signed   By: Sebastian AcheAllen  Grady   On: 12/22/2013 10:02     EKG Interpretation None      MDM X-ray of the right first toe suggest a possible nondisplaced fracture of the proximal phalanx. No other injury reported. The vital signs are well within normal limits. Pulse oximetry is 100% on room air. Within  normal limits by my interpretation.  Fracture care was carried out for the right first toe. A prescription for diclofenac and Norco given to the patient. Patient is advised to see Dr. Romeo AppleHarrison for follow up of this fracture.    Final diagnoses:  None    *I have reviewed nursing notes, vital signs, and all appropriate lab and imaging results for this patient.Kathie Dike**    Lashanna Angelo M Marissah Vandemark, PA-C 12/23/13 2208

## 2013-12-22 NOTE — ED Notes (Addendum)
Pt presents with pain and bruising to right great toe. Pt states he jammed toe on table last night.

## 2013-12-22 NOTE — Discharge Instructions (Signed)
You have a fracture involving the right first toe. Please see Dr. Romeo AppleHarrison, or the orthopedic specialist of your choice for management of this fracture. Please keep your right foot elevated above your waist. Please apply ice. Use diclofenac 2 times daily with food until all taken. May use Norco for pain if needed. This medication may cause drowsiness, please use with caution. Toe Fracture Your caregiver has diagnosed you as having a fractured toe. A toe fracture is a break in the bone of a toe. "Buddy taping" is a way of splinting your broken toe, by taping the broken toe to the toe next to it. This "buddy taping" will keep the injured toe from moving beyond normal range of motion. Buddy taping also helps the toe heal in a more normal alignment. It may take 6 to 8 weeks for the toe injury to heal. HOME CARE INSTRUCTIONS   Leave your toes taped together for as long as directed by your caregiver or until you see a doctor for a follow-up examination. You can change the tape after bathing. Always use a small piece of gauze or cotton between the toes when taping them together. This will help the skin stay dry and prevent infection.  Apply ice to the injury for 15-20 minutes each hour while awake for the first 2 days. Put the ice in a plastic bag and place a towel between the bag of ice and your skin.  After the first 2 days, apply heat to the injured area. Use heat for the next 2 to 3 days. Place a heating pad on the foot or soak the foot in warm water as directed by your caregiver.  Keep your foot elevated as much as possible to lessen swelling.  Wear sturdy, supportive shoes. The shoes should not pinch the toes or fit tightly against the toes.  Your caregiver may prescribe a rigid shoe if your foot is very swollen.  Your may be given crutches if the pain is too great and it hurts too much to walk.  Only take over-the-counter or prescription medicines for pain, discomfort, or fever as directed by your  caregiver.  If your caregiver has given you a follow-up appointment, it is very important to keep that appointment. Not keeping the appointment could result in a chronic or permanent injury, pain, and disability. If there is any problem keeping the appointment, you must call back to this facility for assistance. SEEK MEDICAL CARE IF:   You have increased pain or swelling, not relieved with medications.  The pain does not get better after 1 week.  Your injured toe is cold when the others are warm. SEEK IMMEDIATE MEDICAL CARE IF:   The toe becomes cold, numb, or white.  The toe becomes hot (inflamed) and red. Document Released: 08/06/2000 Document Revised: 11/01/2011 Document Reviewed: 03/25/2008 [facility] Patient Information 2014 [place]Care, [place]LL[place].

## 2013-12-24 NOTE — ED Provider Notes (Signed)
Medical screening examination/treatment/procedure(s) were performed by non-physician practitioner and as supervising physician I was immediately available for consultation/collaboration.   EKG Interpretation None        Chais Fehringer J. Calene Paradiso, MD 12/24/13 0804 

## 2013-12-27 ENCOUNTER — Ambulatory Visit: Payer: Self-pay | Admitting: Orthopedic Surgery

## 2013-12-27 ENCOUNTER — Encounter: Payer: Self-pay | Admitting: Orthopedic Surgery

## 2014-08-04 ENCOUNTER — Emergency Department (HOSPITAL_COMMUNITY)
Admission: EM | Admit: 2014-08-04 | Discharge: 2014-08-05 | Disposition: A | Discharge status: Home / Self Care | Payer: Self-pay | Attending: Emergency Medicine | Admitting: Emergency Medicine

## 2014-08-04 ENCOUNTER — Encounter (HOSPITAL_COMMUNITY): Payer: Self-pay | Admitting: *Deleted

## 2014-08-04 DIAGNOSIS — B349 Viral infection, unspecified: Secondary | ICD-10-CM | POA: Insufficient documentation

## 2014-08-04 DIAGNOSIS — Z720 Tobacco use: Secondary | ICD-10-CM | POA: Insufficient documentation

## 2014-08-04 DIAGNOSIS — R000 Tachycardia, unspecified: Secondary | ICD-10-CM | POA: Insufficient documentation

## 2014-08-04 DIAGNOSIS — Z79899 Other long term (current) drug therapy: Secondary | ICD-10-CM | POA: Insufficient documentation

## 2014-08-04 DIAGNOSIS — Z791 Long term (current) use of non-steroidal anti-inflammatories (NSAID): Secondary | ICD-10-CM | POA: Insufficient documentation

## 2014-08-04 DIAGNOSIS — R569 Unspecified convulsions: Secondary | ICD-10-CM | POA: Insufficient documentation

## 2014-08-04 LAB — URINALYSIS, ROUTINE W REFLEX MICROSCOPIC
Glucose, UA: NEGATIVE mg/dL
Hgb urine dipstick: NEGATIVE
Ketones, ur: 40 mg/dL — AB
Leukocytes, UA: NEGATIVE
NITRITE: NEGATIVE
Protein, ur: NEGATIVE mg/dL
SPECIFIC GRAVITY, URINE: 1.025 (ref 1.005–1.030)
UROBILINOGEN UA: 1 mg/dL (ref 0.0–1.0)
pH: 6 (ref 5.0–8.0)

## 2014-08-04 LAB — COMPREHENSIVE METABOLIC PANEL
ALK PHOS: 92 U/L (ref 39–117)
ALT: 140 U/L — AB (ref 0–53)
ANION GAP: 14 (ref 5–15)
AST: 64 U/L — ABNORMAL HIGH (ref 0–37)
Albumin: 4.2 g/dL (ref 3.5–5.2)
BUN: 14 mg/dL (ref 6–23)
CALCIUM: 9.2 mg/dL (ref 8.4–10.5)
CO2: 27 mEq/L (ref 19–32)
Chloride: 98 mEq/L (ref 96–112)
Creatinine, Ser: 0.76 mg/dL (ref 0.50–1.35)
GFR calc non Af Amer: >90 mL/min (ref >90)
GLUCOSE: 96 mg/dL (ref 70–99)
POTASSIUM: 4 meq/L (ref 3.7–5.3)
SODIUM: 139 meq/L (ref 137–147)
TOTAL PROTEIN: 7.6 g/dL (ref 6.0–8.3)
Total Bilirubin: 1 mg/dL (ref 0.3–1.2)

## 2014-08-04 LAB — RAPID STREP SCREEN (MED CTR MEBANE ONLY): STREPTOCOCCUS, GROUP A SCREEN (DIRECT): NEGATIVE

## 2014-08-04 LAB — LIPASE, BLOOD: Lipase: 17 U/L (ref 11–59)

## 2014-08-04 MED ORDER — ONDANSETRON HCL 4 MG/2ML IJ SOLN
4.0000 mg | Freq: Once | INTRAMUSCULAR | Status: AC
  Administered 2014-08-04: 4 mg via INTRAMUSCULAR
  Filled 2014-08-04: qty 2

## 2014-08-04 MED ORDER — MORPHINE SULFATE 4 MG/ML IJ SOLN
4.0000 mg | Freq: Once | INTRAMUSCULAR | Status: AC
  Administered 2014-08-04: 4 mg via INTRAVENOUS
  Filled 2014-08-04: qty 1

## 2014-08-04 NOTE — ED Notes (Signed)
Pt c/o emesis, body aches, and fever since early this morning.

## 2014-08-04 NOTE — ED Provider Notes (Signed)
CSN: [id number]     Arrival date & time 08/04/14  2133 History   First MD Initiated Contact with Patient 08/04/14 2201     Chief Complaint  Patient presents with  . Emesis     (Consider location/radiation/quality/duration/timing/severity/associated sxs/prior Treatment) Patient is a 23 y.o. male presenting with vomiting. The history is provided by the patient.  Emesis Severity:  Moderate Duration:  15 hours Timing:  Intermittent Number of daily episodes:  5 Quality:  Bilious material Progression:  Worsening Chronicity:  New Recent urination:  Normal Relieved by:  Nothing Ineffective treatments: pepto and benadryl. Associated symptoms: fever and URI   Associated symptoms: no abdominal pain, no arthralgias, no chills, no diarrhea, no headaches and no sore throat   Associated symptoms comment:  Body aches and nasal congestion. Risk factors: sick contacts   Risk factors: no diabetes     Past Medical History  Diagnosis Date  . Seizures    Past Surgical History  Procedure Laterality Date  . Fracture surgery     Family History  Problem Relation Age of Onset  . Seizures Mother   . Seizures Father    History  Substance Use Topics  . Smoking status: Current Every Day Smoker -- 0.50 packs/day for 10 years    Types: Cigarettes  . Smokeless tobacco: Not on file  . Alcohol Use: 0.6 oz/week    1 Cans of beer per week     Comment: Occasional    Review of Systems  Constitutional: Negative for chills and activity change.       All ROS Neg except as noted in HPI  HENT: Negative for sore throat.   Eyes: Negative for photophobia and discharge.  Respiratory: Negative for cough, shortness of breath and wheezing.   Cardiovascular: Negative for chest pain and palpitations.  Gastrointestinal: Positive for vomiting. Negative for abdominal pain, diarrhea and blood in stool.  Genitourinary: Negative for dysuria, frequency and hematuria.  Musculoskeletal: Negative for back pain,  arthralgias and neck pain.  Skin: Negative.   Neurological: Positive for seizures. Negative for dizziness, speech difficulty and headaches.  Psychiatric/Behavioral: Negative for hallucinations and confusion.      Allergies  Review of patient's allergies indicates no known allergies.  Home Medications   Prior to Admission medications   Medication Sig Start Date End Date Taking? Authorizing Provider  diclofenac (VOLTAREN) 75 MG EC tablet Take 1 tablet (75 mg total) by mouth 2 (two) times daily. 12/22/13   Lenox Ahr, PA-C  HYDROcodone-acetaminophen (NORCO) 7.5-325 MG per tablet Take 1 tablet by mouth every 4 (four) hours as needed for moderate pain. 12/22/13   Lenox Ahr, PA-C  QUEtiapine (SEROQUEL) 50 MG tablet Take 100 mg by mouth at bedtime.    Historical Provider, MD   BP 110/68 mmHg  Pulse 111  Temp(Src) 99.3 F (37.4 C) (Oral)  Resp 22  Ht 6' 2"  (1.88 m)  Wt 180 lb (81.647 kg)  BMI 23.10 kg/m2  SpO2 97% Physical Exam  Constitutional: He is oriented to person, place, and time. He appears well-developed and well-nourished.  Non-toxic appearance.  HENT:  Head: Normocephalic.  Right Ear: Tympanic membrane and external ear normal.  Left Ear: Tympanic membrane and external ear normal.  Eyes: EOM and lids are normal. Pupils are equal, round, and reactive to light.  Neck: Normal range of motion. Neck supple. Carotid bruit is not present.  Cardiovascular: Regular rhythm, normal heart sounds, intact distal pulses and normal pulses.  Tachycardia present.  Pulmonary/Chest: Breath sounds normal. No respiratory distress.  Abdominal: Soft. Bowel sounds are normal. He exhibits no distension and no mass. There is no tenderness. There is no guarding.  Musculoskeletal: Normal range of motion.  Lymphadenopathy:       Head (right side): No submandibular adenopathy present.       Head (left side): No submandibular adenopathy present.    He has no cervical adenopathy.  Neurological:  He is alert and oriented to person, place, and time. He has normal strength. No cranial nerve deficit or sensory deficit.  Skin: Skin is warm and dry.  Psychiatric: He has a normal mood and affect. His speech is normal.  Nursing note and vitals reviewed.   ED Course  Procedures (including critical care time) Labs Review Labs Reviewed - No data to display  Imaging Review No results found.   EKG Interpretation None      MDM  Strep test negative. UA does not reveal infection or kidney stone, or severe dehydration. Comprehensive Met panel reveals elevated AST and ALT. Family reports pt has had dx of Hep C in the past. Lipase negative at 18. Exam suggest viral illness.  Pt feeling better after IV fluids and pain meds. Pt to see his PCP for recheck in the office.     Final diagnoses:  Viral illness    **I have reviewed nursing notes, vital signs, and all appropriate lab and imaging results for this patient.Lenox Ahr, PA-C 08/06/14 1520  Leota Jacobsen, MD 08/08/14 [phone]

## 2014-08-05 MED ORDER — ONDANSETRON 4 MG PO TBDP: ORAL_TABLET | ORAL | Status: DC

## 2014-08-05 NOTE — Discharge Instructions (Signed)
Your test and exam suggest acute viral illness. Please increase fluids. Use tylenol or ibuprofen for fever and soreness. Please wash hands frequently. Use zofran every 6 hours when needed for nausea. Viral Infections A viral infection can be caused by different types of viruses.Most viral infections are not serious and resolve on their own. However, some infections may cause severe symptoms and may lead to further complications. SYMPTOMS Viruses can frequently cause:  Minor sore throat.  Aches and pains.  Headaches.  Runny nose.  Different types of rashes.  Watery eyes.  Tiredness.  Cough.  Loss of appetite.  Gastrointestinal infections, resulting in nausea, vomiting, and diarrhea. These symptoms do not respond to antibiotics because the infection is not caused by bacteria. However, you might catch a bacterial infection following the viral infection. This is sometimes called a "superinfection." Symptoms of such a bacterial infection may include:  Worsening sore throat with pus and difficulty swallowing.  Swollen neck glands.  Chills and a high or persistent fever.  Severe headache.  Tenderness over the sinuses.  Persistent overall ill feeling (malaise), muscle aches, and tiredness (fatigue).  Persistent cough.  Yellow, green, or brown mucus production with coughing. HOME CARE INSTRUCTIONS   Only take over-the-counter or prescription medicines for pain, discomfort, diarrhea, or fever as directed by your caregiver.  Drink enough water and fluids to keep your urine clear or pale yellow. Sports drinks can provide valuable electrolytes, sugars, and hydration.  Get plenty of rest and maintain proper nutrition. Soups and broths with crackers or rice are fine. SEEK IMMEDIATE MEDICAL CARE IF:   You have severe headaches, shortness of breath, chest pain, neck pain, or an unusual rash.  You have uncontrolled vomiting, diarrhea, or you are unable to keep down fluids.  You  or your child has an oral temperature above 102 F (38.9 C), not controlled by medicine.  Your baby is older than 3 months with a rectal temperature of 102 F (38.9 C) or higher.  Your baby is 223 months old or younger with a rectal temperature of 100.4 F (38 C) or higher. MAKE SURE YOU:   Understand these instructions.  Will watch your condition.  Will get help right away if you are not doing well or get worse. Document Released: 05/19/2005 Document Revised: 11/01/2011 Document Reviewed: 12/14/2010 [facility] Patient Information 2015 [place]ExitCare, [place]LLC. This information is not intended to replace advice given to you by your health care provider. Make sure you discuss any questions you have with your health care provider.

## 2014-08-07 LAB — CULTURE, GROUP A STREP

## 2015-01-21 DIAGNOSIS — B1920 Unspecified viral hepatitis C without hepatic coma: Secondary | ICD-10-CM

## 2015-01-21 HISTORY — DX: Unspecified viral hepatitis C without hepatic coma: B19.20

## 2015-03-11 ENCOUNTER — Encounter (HOSPITAL_COMMUNITY): Payer: Self-pay | Admitting: Emergency Medicine

## 2015-03-11 ENCOUNTER — Emergency Department (HOSPITAL_COMMUNITY)
Admission: EM | Admit: 2015-03-11 | Discharge: 2015-03-11 | Disposition: A | Discharge status: Home / Self Care | Payer: Self-pay | Attending: Emergency Medicine | Admitting: Emergency Medicine

## 2015-03-11 ENCOUNTER — Emergency Department (HOSPITAL_COMMUNITY): Payer: Self-pay

## 2015-03-11 DIAGNOSIS — S9032XA Contusion of left foot, initial encounter: Secondary | ICD-10-CM | POA: Insufficient documentation

## 2015-03-11 DIAGNOSIS — Z791 Long term (current) use of non-steroidal anti-inflammatories (NSAID): Secondary | ICD-10-CM | POA: Insufficient documentation

## 2015-03-11 DIAGNOSIS — Y9289 Other specified places as the place of occurrence of the external cause: Secondary | ICD-10-CM | POA: Insufficient documentation

## 2015-03-11 DIAGNOSIS — S299XXA Unspecified injury of thorax, initial encounter: Secondary | ICD-10-CM | POA: Insufficient documentation

## 2015-03-11 DIAGNOSIS — Y9389 Activity, other specified: Secondary | ICD-10-CM | POA: Insufficient documentation

## 2015-03-11 DIAGNOSIS — Z720 Tobacco use: Secondary | ICD-10-CM | POA: Insufficient documentation

## 2015-03-11 DIAGNOSIS — S8012XA Contusion of left lower leg, initial encounter: Secondary | ICD-10-CM | POA: Insufficient documentation

## 2015-03-11 DIAGNOSIS — Z8669 Personal history of other diseases of the nervous system and sense organs: Secondary | ICD-10-CM | POA: Insufficient documentation

## 2015-03-11 DIAGNOSIS — Y998 Other external cause status: Secondary | ICD-10-CM | POA: Insufficient documentation

## 2015-03-11 MED ORDER — HYDROCODONE-ACETAMINOPHEN 5-325 MG PO TABS
1.0000 | ORAL_TABLET | Freq: Once | ORAL | Status: AC
  Administered 2015-03-11: 1 via ORAL

## 2015-03-11 MED ORDER — HYDROCODONE-ACETAMINOPHEN 5-325 MG PO TABS
ORAL_TABLET | ORAL | Status: AC
  Filled 2015-03-11: qty 1

## 2015-03-11 MED ORDER — HYDROCODONE-ACETAMINOPHEN 5-325 MG PO TABS: 1.0000 | ORAL_TABLET | ORAL | Status: DC | PRN

## 2015-03-11 MED ORDER — IBUPROFEN 600 MG PO TABS: 600.0000 mg | ORAL_TABLET | Freq: Four times a day (QID) | ORAL | Status: DC | PRN
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
## 2015-03-11 NOTE — Discharge Instructions (Signed)
Contusion °A contusion is a deep bruise. Contusions are the result of an injury that caused bleeding under the skin. The contusion may turn blue, purple, or yellow. Minor injuries will give you a painless contusion, but more severe contusions may stay painful and swollen for a few weeks.  °CAUSES  °A contusion is usually caused by a blow, trauma, or direct force to an area of the body. °SYMPTOMS  °· Swelling and redness of the injured area. °· Bruising of the injured area. °· Tenderness and soreness of the injured area. °· Pain. °DIAGNOSIS  °The diagnosis can be made by taking a history and physical exam. An X-ray, CT scan, or MRI may be needed to determine if there were any associated injuries, such as fractures. °TREATMENT  °Specific treatment will depend on what area of the body was injured. In general, the best treatment for a contusion is resting, icing, elevating, and applying cold compresses to the injured area. Over-the-counter medicines may also be recommended for pain control. Ask your caregiver what the best treatment is for your contusion. °HOME CARE INSTRUCTIONS  °· Put ice on the injured area. °¨ Put ice in a plastic bag. °¨ Place a towel between your skin and the bag. °¨ Leave the ice on for 15-20 minutes, 3-4 times a day, or as directed by your health care provider. °· Only take over-the-counter or prescription medicines for pain, discomfort, or fever as directed by your caregiver. Your caregiver may recommend avoiding anti-inflammatory medicines (aspirin, ibuprofen, and naproxen) for 48 hours because these medicines may increase bruising. °· Rest the injured area. °· If possible, elevate the injured area to reduce swelling. °SEEK IMMEDIATE MEDICAL CARE IF:  °· You have increased bruising or swelling. °· You have pain that is getting worse. °· Your swelling or pain is not relieved with medicines. °MAKE SURE YOU:  °· Understand these instructions. °· Will watch your condition. °· Will get help right  away if you are not doing well or get worse. °Document Released: 05/19/2005 Document Revised: 08/14/2013 Document Reviewed: 06/14/2011 °[employer]® Patient Information ©2015 [employer]. This information is not intended to replace advice given to you by your health care provider. Make sure you discuss any questions you have with your health care provider. ° °
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
## 2016-01-21 ENCOUNTER — Encounter (HOSPITAL_COMMUNITY): Payer: Self-pay

## 2016-01-21 ENCOUNTER — Emergency Department (HOSPITAL_COMMUNITY)
Admission: EM | Admit: 2016-01-21 | Discharge: 2016-01-21 | Disposition: A | Discharge status: Home / Self Care | Payer: No Typology Code available for payment source | Attending: Emergency Medicine | Admitting: Emergency Medicine

## 2016-01-21 DIAGNOSIS — K0889 Other specified disorders of teeth and supporting structures: Secondary | ICD-10-CM | POA: Insufficient documentation

## 2016-01-21 DIAGNOSIS — Z791 Long term (current) use of non-steroidal anti-inflammatories (NSAID): Secondary | ICD-10-CM | POA: Insufficient documentation

## 2016-01-21 DIAGNOSIS — F17210 Nicotine dependence, cigarettes, uncomplicated: Secondary | ICD-10-CM | POA: Insufficient documentation

## 2016-01-21 HISTORY — DX: Unspecified viral hepatitis C without hepatic coma: B19.20

## 2016-01-21 MED ORDER — PENICILLIN V POTASSIUM 500 MG PO TABS: 500.0000 mg | ORAL_TABLET | Freq: Four times a day (QID) | ORAL | Status: AC

## 2016-01-21 MED ORDER — PENICILLIN V POTASSIUM 250 MG PO TABS
500.0000 mg | ORAL_TABLET | Freq: Once | ORAL | Status: AC
  Administered 2016-01-21: 500 mg via ORAL
  Filled 2016-01-21: qty 2

## 2016-01-21 MED ORDER — NAPROXEN 500 MG PO TABS: 500.0000 mg | ORAL_TABLET | Freq: Two times a day (BID) | ORAL | Status: DC

## 2016-01-21 MED ORDER — NAPROXEN 250 MG PO TABS
500.0000 mg | ORAL_TABLET | Freq: Once | ORAL | Status: AC
  Administered 2016-01-21: 500 mg via ORAL
  Filled 2016-01-21: qty 2

## 2016-01-21 NOTE — ED Notes (Signed)
Pt alert & oriented x4, stable gait. Patient given discharge instructions, paperwork & prescription(s). Patient  instructed to stop at the registration desk to finish any additional paperwork. Patient verbalized understanding. Pt left department w/ no further questions. 

## 2016-01-21 NOTE — ED Notes (Signed)
Pt reports dental pain for several months, worse tonight. Tooth decay present to multiple teeth, top and bottom.

## 2016-01-21 NOTE — ED Provider Notes (Signed)
CSN: [id number]     Arrival date & time 01/21/16  0510 History   First MD Initiated Contact with Patient 01/21/16 [phone]     Chief Complaint  Patient presents with  . Dental Pain     (Consider location/radiation/quality/duration/timing/severity/associated sxs/prior Treatment) HPI Comments: Diffuse dental pain, ongoing for several months, worse tonight. Visiting friend who is a patient in the ED. No fever. No difficulty breathing or swallowing. No chest pain or shortness of breath. No difficulty with secretions. Does not have a dentist. Taking ibuprofen without relief.  Patient is a 25 y.o. male presenting with tooth pain. The history is provided by the patient.  Dental Pain Associated symptoms: no drooling, no fever and no headaches     Past Medical History  Diagnosis Date  . Seizures (HCC)   . Hepatitis C infection 01/21/15   Past Surgical History  Procedure Laterality Date  . Fracture surgery    . Hernia repair     Family History  Problem Relation Age of Onset  . Seizures Mother   . Seizures Father    Social History  Substance Use Topics  . Smoking status: Current Every Day Smoker -- 0.50 packs/day for 10 years    Types: Cigarettes  . Smokeless tobacco: Former NeurosurgeonUser    Quit date: 01/21/1999  . Alcohol Use: No     Comment: Occasional    Review of Systems  Constitutional: Negative for fever, activity change and appetite change.  HENT: Positive for dental problem. Negative for drooling.   Respiratory: Negative for cough, chest tightness and shortness of breath.   Gastrointestinal: Negative for nausea, vomiting and abdominal pain.  Genitourinary: Negative for dysuria.  Musculoskeletal: Negative for myalgias and arthralgias.  Skin: Negative for rash.  Neurological: Negative for dizziness and headaches.      Allergies  Sulfa antibiotics  Home Medications   Prior to Admission medications   Medication Sig Start Date End Date Taking? Authorizing Provider  bismuth  subsalicylate (PEPTO BISMOL) 262 MG/15ML suspension Take 30 mLs by mouth every 6 (six) hours as needed.    Historical Provider, MD  diclofenac (VOLTAREN) 75 MG EC tablet Take 1 tablet (75 mg total) by mouth 2 (two) times daily. Patient not taking: Reported on 08/04/2014 12/22/13   Ivery QualeHobson Bryant, PA-C  HYDROcodone-acetaminophen (NORCO/VICODIN) 5-325 MG per tablet Take 1 tablet by mouth every 4 (four) hours as needed. 03/11/15   Burgess AmorJulie Idol, PA-C  ibuprofen (ADVIL,MOTRIN) 600 MG tablet Take 1 tablet (600 mg total) by mouth every 6 (six) hours as needed. 03/11/15   Burgess AmorJulie Idol, PA-C  naproxen (NAPROSYN) 500 MG tablet Take 1 tablet (500 mg total) by mouth 2 (two) times daily. 01/21/16   Glynn OctaveStephen Mychael Smock, MD  ondansetron (ZOFRAN ODT) 4 MG disintegrating tablet 1 po q6h prn n/v. 08/05/14   Ivery QualeHobson Bryant, PA-C  penicillin v potassium (VEETID) 500 MG tablet Take 1 tablet (500 mg total) by mouth 4 (four) times daily. 01/21/16 01/28/16  Glynn OctaveStephen Keishaun Hazel, MD   BP 115/78 mmHg  Pulse 91  Temp(Src) 98 F (36.7 C) (Oral)  Ht 6\' 4"  (1.93 m)  Wt 186 lb (84.369 kg)  BMI 22.65 kg/m2  SpO2 100% Physical Exam  Constitutional: He is oriented to person, place, and time. He appears well-developed and well-nourished. No distress.  HENT:  Head: Normocephalic and atraumatic.  Mouth/Throat: Oropharynx is clear and moist. No oropharyngeal exudate.  Extremely poor dentition with all teeth broken, decayed. Floor of mouth soft.  No abscess.  Eyes: Conjunctivae  and EOM are normal. Pupils are equal, round, and reactive to light.  Neck: Normal range of motion. Neck supple.  No meningismus.  Cardiovascular: Normal rate, regular rhythm, normal heart sounds and intact distal pulses.   No murmur heard. Pulmonary/Chest: Effort normal and breath sounds normal. No respiratory distress.  Abdominal: Soft. There is no tenderness. There is no rebound and no guarding.  Musculoskeletal: Normal range of motion. He exhibits no edema or  tenderness.  Neurological: He is alert and oriented to person, place, and time. No cranial nerve deficit. He exhibits normal muscle tone. Coordination normal.  No ataxia on finger to nose bilaterally. No pronator drift. 5/5 strength throughout. CN 2-12 intact.Equal grip strength. Sensation intact.   Skin: Skin is warm.  Psychiatric: He has a normal mood and affect. His behavior is normal.  Nursing note and vitals reviewed.   ED Course  Procedures (including critical care time) Labs Review Labs Reviewed - No data to display  Imaging Review No results found. I have personally reviewed and evaluated these images and lab results as part of my medical decision-making.   EKG Interpretation None      MDM   Final diagnoses:  Pain, dental   Dental caries and dental pain. No evidence of abscess or ludwig's angina. Tolerating PO.  Treat with antibiotics and non narcotic pain control.  Followup with dentistry. Return precautions discussed.   Glynn Octave, MD 01/21/16 1027

## 2016-01-21 NOTE — Discharge Instructions (Signed)

## 2020-12-28 ENCOUNTER — Emergency Department (HOSPITAL_COMMUNITY): Payer: Medicaid Other

## 2020-12-28 ENCOUNTER — Encounter (HOSPITAL_COMMUNITY): Payer: Self-pay | Admitting: *Deleted

## 2020-12-28 ENCOUNTER — Inpatient Hospital Stay (HOSPITAL_COMMUNITY)
Admission: EM | Admit: 2020-12-28 | Discharge: 2021-01-15 | DRG: 871 | Disposition: A | Discharge status: Home / Self Care | Payer: Medicaid Other | Attending: Family Medicine | Admitting: Family Medicine

## 2020-12-28 ENCOUNTER — Other Ambulatory Visit: Payer: Self-pay

## 2020-12-28 DIAGNOSIS — R7881 Bacteremia: Secondary | ICD-10-CM | POA: Diagnosis present

## 2020-12-28 DIAGNOSIS — I38 Endocarditis, valve unspecified: Secondary | ICD-10-CM | POA: Diagnosis not present

## 2020-12-28 DIAGNOSIS — E871 Hypo-osmolality and hyponatremia: Secondary | ICD-10-CM

## 2020-12-28 DIAGNOSIS — R739 Hyperglycemia, unspecified: Secondary | ICD-10-CM | POA: Diagnosis present

## 2020-12-28 DIAGNOSIS — F1123 Opioid dependence with withdrawal: Secondary | ICD-10-CM | POA: Diagnosis present

## 2020-12-28 DIAGNOSIS — Z79899 Other long term (current) drug therapy: Secondary | ICD-10-CM | POA: Diagnosis not present

## 2020-12-28 DIAGNOSIS — B1920 Unspecified viral hepatitis C without hepatic coma: Secondary | ICD-10-CM | POA: Diagnosis present

## 2020-12-28 DIAGNOSIS — E878 Other disorders of electrolyte and fluid balance, not elsewhere classified: Secondary | ICD-10-CM | POA: Diagnosis not present

## 2020-12-28 DIAGNOSIS — F17210 Nicotine dependence, cigarettes, uncomplicated: Secondary | ICD-10-CM | POA: Diagnosis present

## 2020-12-28 DIAGNOSIS — A4102 Sepsis due to Methicillin resistant Staphylococcus aureus: Principal | ICD-10-CM | POA: Diagnosis present

## 2020-12-28 DIAGNOSIS — I76 Septic arterial embolism: Secondary | ICD-10-CM

## 2020-12-28 DIAGNOSIS — J90 Pleural effusion, not elsewhere classified: Secondary | ICD-10-CM | POA: Diagnosis present

## 2020-12-28 DIAGNOSIS — L03114 Cellulitis of left upper limb: Secondary | ICD-10-CM | POA: Diagnosis present

## 2020-12-28 DIAGNOSIS — B9562 Methicillin resistant Staphylococcus aureus infection as the cause of diseases classified elsewhere: Secondary | ICD-10-CM | POA: Diagnosis present

## 2020-12-28 DIAGNOSIS — A419 Sepsis, unspecified organism: Secondary | ICD-10-CM | POA: Diagnosis present

## 2020-12-28 DIAGNOSIS — L089 Local infection of the skin and subcutaneous tissue, unspecified: Secondary | ICD-10-CM | POA: Diagnosis not present

## 2020-12-28 DIAGNOSIS — D649 Anemia, unspecified: Secondary | ICD-10-CM | POA: Diagnosis not present

## 2020-12-28 DIAGNOSIS — E876 Hypokalemia: Secondary | ICD-10-CM | POA: Diagnosis not present

## 2020-12-28 DIAGNOSIS — B9689 Other specified bacterial agents as the cause of diseases classified elsewhere: Secondary | ICD-10-CM | POA: Diagnosis present

## 2020-12-28 DIAGNOSIS — J189 Pneumonia, unspecified organism: Secondary | ICD-10-CM | POA: Diagnosis present

## 2020-12-28 DIAGNOSIS — R079 Chest pain, unspecified: Secondary | ICD-10-CM

## 2020-12-28 DIAGNOSIS — F1120 Opioid dependence, uncomplicated: Secondary | ICD-10-CM | POA: Diagnosis present

## 2020-12-28 DIAGNOSIS — R6520 Severe sepsis without septic shock: Secondary | ICD-10-CM | POA: Diagnosis present

## 2020-12-28 DIAGNOSIS — T402X5A Adverse effect of other opioids, initial encounter: Secondary | ICD-10-CM | POA: Diagnosis present

## 2020-12-28 DIAGNOSIS — Z20822 Contact with and (suspected) exposure to covid-19: Secondary | ICD-10-CM | POA: Diagnosis present

## 2020-12-28 DIAGNOSIS — Z882 Allergy status to sulfonamides status: Secondary | ICD-10-CM | POA: Diagnosis not present

## 2020-12-28 DIAGNOSIS — F1510 Other stimulant abuse, uncomplicated: Secondary | ICD-10-CM | POA: Diagnosis present

## 2020-12-28 DIAGNOSIS — R0689 Other abnormalities of breathing: Secondary | ICD-10-CM

## 2020-12-28 DIAGNOSIS — K5903 Drug induced constipation: Secondary | ICD-10-CM | POA: Diagnosis present

## 2020-12-28 DIAGNOSIS — D72829 Elevated white blood cell count, unspecified: Secondary | ICD-10-CM

## 2020-12-28 DIAGNOSIS — E861 Hypovolemia: Secondary | ICD-10-CM | POA: Diagnosis present

## 2020-12-28 HISTORY — DX: Pneumonia, unspecified organism: J18.9

## 2020-12-28 HISTORY — DX: Septic arterial embolism: I76

## 2020-12-28 HISTORY — DX: Hypo-osmolality and hyponatremia: E87.1

## 2020-12-28 HISTORY — DX: Sepsis, unspecified organism: A41.9

## 2020-12-28 LAB — TROPONIN I (HIGH SENSITIVITY)
Troponin I (High Sensitivity): 3 ng/L (ref <18)
Troponin I (High Sensitivity): 3 ng/L (ref <18)

## 2020-12-28 LAB — CBC WITH DIFFERENTIAL/PLATELET
Band Neutrophils: 7 %
Basophils Absolute: 0 10*3/uL (ref 0.0–0.1)
Basophils Relative: 0 %
Eosinophils Absolute: 0 10*3/uL (ref 0.0–0.5)
Eosinophils Relative: 0 %
HCT: 46.6 % (ref 39.0–52.0)
Hemoglobin: 15.9 g/dL (ref 13.0–17.0)
Lymphocytes Relative: 8 %
Lymphs Abs: 1.4 10*3/uL (ref 0.7–4.0)
MCH: 29.7 pg (ref 26.0–34.0)
MCHC: 34.1 g/dL (ref 30.0–36.0)
MCV: 87.1 fL (ref 80.0–100.0)
Monocytes Absolute: 0.9 10*3/uL (ref 0.1–1.0)
Monocytes Relative: 5 %
Neutro Abs: 14.8 10*3/uL — ABNORMAL HIGH (ref 1.7–7.7)
Neutrophils Relative %: 80 %
Platelets: 161 10*3/uL (ref 150–400)
RBC: 5.35 MIL/uL (ref 4.22–5.81)
RDW: 13 % (ref 11.5–15.5)
WBC: 17 10*3/uL — ABNORMAL HIGH (ref 4.0–10.5)
nRBC: 0 % (ref 0.0–0.2)

## 2020-12-28 LAB — URINALYSIS, ROUTINE W REFLEX MICROSCOPIC
Bilirubin Urine: NEGATIVE
Glucose, UA: NEGATIVE mg/dL
Hgb urine dipstick: NEGATIVE
Ketones, ur: NEGATIVE mg/dL
Leukocytes,Ua: NEGATIVE
Nitrite: NEGATIVE
Protein, ur: NEGATIVE mg/dL
Specific Gravity, Urine: 1.027 (ref 1.005–1.030)
pH: 6 (ref 5.0–8.0)

## 2020-12-28 LAB — RAPID URINE DRUG SCREEN, HOSP PERFORMED
Amphetamines: POSITIVE — AB
Barbiturates: NOT DETECTED
Benzodiazepines: NOT DETECTED
Cocaine: NOT DETECTED
Opiates: POSITIVE — AB
Tetrahydrocannabinol: NOT DETECTED

## 2020-12-28 LAB — COMPREHENSIVE METABOLIC PANEL
ALT: 44 U/L (ref 0–44)
AST: 33 U/L (ref 15–41)
Albumin: 3.5 g/dL (ref 3.5–5.0)
Alkaline Phosphatase: 76 U/L (ref 38–126)
Anion gap: 10 (ref 5–15)
BUN: 10 mg/dL (ref 6–20)
CO2: 24 mmol/L (ref 22–32)
Calcium: 8.6 mg/dL — ABNORMAL LOW (ref 8.9–10.3)
Chloride: 96 mmol/L — ABNORMAL LOW (ref 98–111)
Creatinine, Ser: 0.68 mg/dL (ref 0.61–1.24)
GFR, Estimated: >60 mL/min (ref >60)
Glucose, Bld: 160 mg/dL — ABNORMAL HIGH (ref 70–99)
Potassium: 3.6 mmol/L (ref 3.5–5.1)
Sodium: 130 mmol/L — ABNORMAL LOW (ref 135–145)
Total Bilirubin: 0.7 mg/dL (ref 0.3–1.2)
Total Protein: 7.2 g/dL (ref 6.5–8.1)

## 2020-12-28 LAB — RESP PANEL BY RT-PCR (FLU A&B, COVID) ARPGX2
Influenza A by PCR: NEGATIVE
Influenza B by PCR: NEGATIVE
SARS Coronavirus 2 by RT PCR: NEGATIVE

## 2020-12-28 LAB — LACTIC ACID, PLASMA
Lactic Acid, Venous: 2.5 mmol/L (ref 0.5–1.9)
Lactic Acid, Venous: 3.5 mmol/L (ref 0.5–1.9)
Lactic Acid, Venous: 2.2 mmol/L (ref 0.5–1.9)

## 2020-12-28 LAB — MAGNESIUM: Magnesium: 1.8 mg/dL (ref 1.7–2.4)

## 2020-12-28 LAB — HEMOGLOBIN A1C
Hgb A1c MFr Bld: 5.3 % (ref 4.8–5.6)
Mean Plasma Glucose: 105.41 mg/dL

## 2020-12-28 LAB — HIV ANTIBODY (ROUTINE TESTING W REFLEX): HIV Screen 4th Generation wRfx: NONREACTIVE

## 2020-12-28 MED ORDER — OXYCODONE HCL 5 MG PO TABS: 5.0000 mg | ORAL_TABLET | ORAL | Status: DC | PRN

## 2020-12-28 MED ORDER — ACETAMINOPHEN 325 MG PO TABS
650.0000 mg | ORAL_TABLET | Freq: Four times a day (QID) | ORAL | Status: DC | PRN
  Administered 2020-12-29 – 2021-01-06 (×15): 650 mg via ORAL
  Filled 2020-12-28 (×15): qty 2

## 2020-12-28 MED ORDER — ONDANSETRON HCL 4 MG/2ML IJ SOLN
4.0000 mg | Freq: Four times a day (QID) | INTRAMUSCULAR | Status: DC | PRN
  Administered 2021-01-13: 4 mg via INTRAVENOUS
  Filled 2020-12-28: qty 2

## 2020-12-28 MED ORDER — VANCOMYCIN HCL 1500 MG/300ML IV SOLN
1500.0000 mg | Freq: Once | INTRAVENOUS | Status: AC
  Administered 2020-12-28: 1500 mg via INTRAVENOUS
  Filled 2020-12-28: qty 300

## 2020-12-28 MED ORDER — ONDANSETRON HCL 4 MG PO TABS: 4.0000 mg | ORAL_TABLET | Freq: Four times a day (QID) | ORAL | Status: DC | PRN

## 2020-12-28 MED ORDER — SODIUM CHLORIDE 0.9 % IV SOLN
2.0000 g | Freq: Three times a day (TID) | INTRAVENOUS | Status: DC
  Administered 2020-12-29 (×2): 2 g via INTRAVENOUS
  Filled 2020-12-28 (×2): qty 2

## 2020-12-28 MED ORDER — LACTATED RINGERS IV BOLUS (SEPSIS)
500.0000 mL | Freq: Once | INTRAVENOUS | Status: DC
Start: 2020-12-28 — End: 2020-12-29

## 2020-12-28 MED ORDER — CEFEPIME HCL 2 G IJ SOLR
2.0000 g | Freq: Once | INTRAMUSCULAR | Status: AC
  Administered 2020-12-28: 2 g via INTRAVENOUS
  Filled 2020-12-28: qty 2

## 2020-12-28 MED ORDER — ALBUTEROL SULFATE HFA 108 (90 BASE) MCG/ACT IN AERS: 2.0000 | INHALATION_SPRAY | Freq: Four times a day (QID) | RESPIRATORY_TRACT | Status: DC | PRN

## 2020-12-28 MED ORDER — LACTATED RINGERS IV SOLN: INTRAVENOUS | Status: DC

## 2020-12-28 MED ORDER — MORPHINE SULFATE (PF) 4 MG/ML IV SOLN
4.0000 mg | Freq: Once | INTRAVENOUS | Status: AC
Start: 2020-12-28 — End: 2020-12-28
  Administered 2020-12-28: 4 mg via INTRAVENOUS
  Filled 2020-12-28: qty 1

## 2020-12-28 MED ORDER — VANCOMYCIN HCL IN DEXTROSE 1-5 GM/200ML-% IV SOLN: 1000.0000 mg | Freq: Once | INTRAVENOUS | Status: DC

## 2020-12-28 MED ORDER — VANCOMYCIN HCL IN DEXTROSE 1-5 GM/200ML-% IV SOLN
1000.0000 mg | Freq: Three times a day (TID) | INTRAVENOUS | Status: DC
  Administered 2020-12-29 – 2020-12-31 (×8): 1000 mg via INTRAVENOUS
  Filled 2020-12-28 (×8): qty 200

## 2020-12-28 MED ORDER — ACETAMINOPHEN 650 MG RE SUPP: 650.0000 mg | Freq: Four times a day (QID) | RECTAL | Status: DC | PRN

## 2020-12-28 MED ORDER — LACTATED RINGERS IV BOLUS (SEPSIS)
1000.0000 mL | Freq: Once | INTRAVENOUS | Status: AC
  Administered 2020-12-28: 1000 mL via INTRAVENOUS

## 2020-12-28 MED ORDER — LACTATED RINGERS IV BOLUS (SEPSIS): 1000.0000 mL | Freq: Once | INTRAVENOUS | Status: DC

## 2020-12-28 MED ORDER — BUPRENORPHINE HCL-NALOXONE HCL 8-2 MG SL SUBL
1.0000 | SUBLINGUAL_TABLET | Freq: Two times a day (BID) | SUBLINGUAL | Status: DC
Start: 2020-12-28 — End: 2021-01-15
  Administered 2020-12-28 – 2021-01-15 (×36): 1 via SUBLINGUAL
  Filled 2020-12-28 (×37): qty 1

## 2020-12-28 MED ORDER — ENOXAPARIN SODIUM 40 MG/0.4ML IJ SOSY
40.0000 mg | PREFILLED_SYRINGE | INTRAMUSCULAR | Status: DC
  Administered 2020-12-28 – 2021-01-14 (×15): 40 mg via SUBCUTANEOUS
  Filled 2020-12-28 (×18): qty 0.4

## 2020-12-28 MED ORDER — SODIUM CHLORIDE 0.9 % IV BOLUS
1000.0000 mL | Freq: Once | INTRAVENOUS | Status: AC
  Administered 2020-12-28: 1000 mL via INTRAVENOUS

## 2020-12-28 MED ORDER — IOHEXOL 350 MG/ML SOLN
100.0000 mL | Freq: Once | INTRAVENOUS | Status: AC | PRN
  Administered 2020-12-28: 100 mL via INTRAVENOUS

## 2020-12-28 MED ORDER — SODIUM CHLORIDE 0.9 % IV SOLN: INTRAVENOUS | Status: AC

## 2020-12-28 NOTE — Progress Notes (Addendum)
Pharmacy Antibiotic Note  George Lucas is a 30 y.o. male admitted on 12/28/2020 with bacteremia.  Pharmacy has been consulted for cefepime and vancomycin dosing.  Plan: Cefepime 2gm iv q8h Vancomycin 1000mg  IV every 8 hours.  Goal trough 15-20 mcg/mL.  Height: 6\' 2"  (188 cm) Weight: 81.6 kg (180 lb) IBW/kg (Calculated) : 82.2  Temp (24hrs), Avg:98.2 F (36.8 C), Min:98.2 F (36.8 C), Max:98.2 F (36.8 C)  Recent Labs  Lab 12/28/20 1325  WBC 17.0*  CREATININE 0.68    Estimated Creatinine Clearance: 158.7 mL/min (by C-G formula based on SCr of 0.68 mg/dL).    Allergies  Allergen Reactions  . Sulfa Antibiotics Anaphylaxis and Rash    Antimicrobials this admission: 5/8 vancomycin >>  5/8 cefepime >>  Microbiology results: 5/8 BCx: sent   Thank you for allowing pharmacy to be a part of this patient's care.  7/8 George Lucas 12/28/2020 5:11 PM

## 2020-12-28 NOTE — ED Provider Notes (Signed)
[facility]ERGENCY DEPARTMENT Provider Note   CSN: [id number] Arrival date & time: 12/28/20  1305     History Chief Complaint  Patient presents with  . Chest Pain    George Lucas is a 30 y.o. male with a history of hepatitis C infection, seizures, opiate use disorder.  Patient presents with chief complaint of chest pain.  Patient reports chest pain began 0100 this morning and woke him from sleep.  Pain has been constant since then.  Pain is located across his entire chest.  Patient rates his pain 9/10 the pain scale.  Pain is worse with movement and breathing.  Patient denies no relief with Tylenol or ibuprofen.  Patient endorses nonproductive cough, fever with T-max of 104 F orally last night, rhinorrhea, diarrhea, myalgia.  Patient denies any nausea, vomiting, shortness of breath, diaphoresis, leg swelling, palpitations, lightheadedness, dizziness, syncopal episode, history of PE/DVT, hemoptysis, hormone therapy, surgery last 12 weeks, cancer treatment, recent prolonged immobilization.  Patient endorses smoking cigarettes.  Patient denies any drug use or alcohol use.  Last used intravenous methamphetamine 2 months prior.  HPI     Past Medical History:  Diagnosis Date  . Hepatitis C infection 01/21/15  . Seizures (HCC)     There are no problems to display for this patient.   Past Surgical History:  Procedure Laterality Date  . FRACTURE SURGERY    . HERNIA REPAIR         Family History  Problem Relation Age of Onset  . Seizures Mother   . Seizures Father     Social History   Tobacco Use  . Smoking status: Current Every Day Smoker    Packs/day: 0.50    Years: 10.00    Pack years: 5.00    Types: Cigarettes  . Smokeless tobacco: Former NeurosurgeonUser    Quit date: 01/21/1999  Substance Use Topics  . Alcohol use: No    Comment: Occasional  . Drug use: No    Home Medications Prior to Admission medications   Medication Sig Start Date End Date Taking? Authorizing  Provider  bismuth subsalicylate (PEPTO BISMOL) 262 MG/15ML suspension Take 30 mLs by mouth every 6 (six) hours as needed.    [provider]  diclofenac (VOLTAREN) 75 MG EC tablet Take 1 tablet (75 mg total) by mouth 2 (two) times daily. Patient not taking: Reported on 08/04/2014 12/22/13   Ivery QualeBryant, Hobson, PA-C  HYDROcodone-acetaminophen (NORCO/VICODIN) 5-325 MG per tablet Take 1 tablet by mouth every 4 (four) hours as needed. 03/11/15   Burgess AmorIdol, Julie, PA-C  ibuprofen (ADVIL,MOTRIN) 600 MG tablet Take 1 tablet (600 mg total) by mouth every 6 (six) hours as needed. 03/11/15   Burgess AmorIdol, Julie, PA-C  naproxen (NAPROSYN) 500 MG tablet Take 1 tablet (500 mg total) by mouth 2 (two) times daily. 01/21/16   Rancour, Jeannett SeniorStephen, MD  ondansetron (ZOFRAN ODT) 4 MG disintegrating tablet 1 po q6h prn n/v. 08/05/14   Ivery QualeBryant, Hobson, PA-C    Allergies    Sulfa antibiotics  Review of Systems   Review of Systems  Constitutional: Positive for chills and fever.  HENT: Positive for rhinorrhea. Negative for congestion and sore throat.   Eyes: Negative for visual disturbance.  Respiratory: Positive for cough. Negative for shortness of breath.   Cardiovascular: Positive for chest pain. Negative for palpitations and leg swelling.  Gastrointestinal: Negative for abdominal distention, abdominal pain, anal bleeding, blood in stool, constipation, diarrhea, nausea, rectal pain and vomiting.  Genitourinary: Negative for difficulty urinating,  dysuria, hematuria, penile discharge, penile swelling, scrotal swelling and testicular pain.  Musculoskeletal: Negative for back pain and neck pain.  Skin: Negative for color change and rash.  Neurological: Negative for dizziness, syncope, light-headedness and headaches.  Psychiatric/Behavioral: Negative for confusion.    Physical Exam Updated Vital Signs BP (!) 141/88   Pulse (!) 117   Temp 98.2 F (36.8 C)   Resp (!) 27   Ht 6\' 2"  (1.88 m)   SpO2 100%   BMI 23.88 kg/m    Physical Exam Vitals and nursing note reviewed.  Constitutional:      General: He is not in acute distress.    Appearance: He is ill-appearing. He is not toxic-appearing or diaphoretic.     Comments: Appears uncomfortable due to complaints of pain  HENT:     Head: Normocephalic.  Eyes:     General: No scleral icterus.       Right eye: No discharge.        Left eye: No discharge.  Cardiovascular:     Rate and Rhythm: Tachycardia present.     Pulses:          Radial pulses are 3+ on the right side and 3+ on the left side.     Heart sounds: Normal heart sounds.     Comments: Tachycardic at rate of 117 Pulmonary:     Effort: Pulmonary effort is normal. Tachypnea present. No respiratory distress.     Breath sounds: Normal breath sounds. No stridor.     Comments: Patient able to speak in full complete sentences without difficulty Abdominal:     General: Abdomen is flat.     Palpations: Abdomen is soft. There is no mass or pulsatile mass.     Tenderness: There is no abdominal tenderness. There is no guarding or rebound.  Musculoskeletal:     Cervical back: Neck supple.     Right lower leg: No tenderness. No edema.     Left lower leg: No tenderness. No edema.  Skin:    General: Skin is warm and dry.     Coloration: Skin is not cyanotic or pale.  Neurological:     General: No focal deficit present.     Mental Status: He is alert.  Psychiatric:        Behavior: Behavior is cooperative.     ED Results / Procedures / Treatments   Labs (all labs ordered are listed, but only abnormal results are displayed) Labs Reviewed  COMPREHENSIVE METABOLIC PANEL - Abnormal; Notable for the following components:      Result Value   Sodium 130 (*)    Chloride 96 (*)    Glucose, Bld 160 (*)    Calcium 8.6 (*)    All other components within normal limits  CBC WITH DIFFERENTIAL/PLATELET - Abnormal; Notable for the following components:   WBC 17.0 (*)    Neutro Abs 14.8 (*)    All other  components within normal limits  RESP PANEL BY RT-PCR (FLU A&B, COVID) ARPGX2  CULTURE, BLOOD (ROUTINE X 2)  CULTURE, BLOOD (ROUTINE X 2)  LACTIC ACID, PLASMA  LACTIC ACID, PLASMA  RAPID URINE DRUG SCREEN, HOSP PERFORMED  URINALYSIS, ROUTINE W REFLEX MICROSCOPIC  TROPONIN I (HIGH SENSITIVITY)  TROPONIN I (HIGH SENSITIVITY)    EKG EKG Interpretation  Date/Time:  Sunday Dec 28 2020 13:16:30 EDT Ventricular Rate:  110 PR Interval:  121 QRS Duration: 72 QT Interval:  316 QTC Calculation: 428 R Axis:   90  Text Interpretation: Sinus tachycardia Right atrial enlargement Probable right ventricular hypertrophy Confirmed by Vanetta Mulders [phone]) on 12/28/2020 1:36:12 PM   Radiology CT Angio Chest PE W and/or Wo Contrast  Result Date: 12/28/2020 CLINICAL DATA:  Chest pain beginning yesterday. EXAM: CT ANGIOGRAPHY CHEST WITH CONTRAST TECHNIQUE: Multidetector CT imaging of the chest was performed using the standard protocol during bolus administration of intravenous contrast. Multiplanar CT image reconstructions and MIPs were obtained to evaluate the vascular anatomy. CONTRAST:  OMNIPAQUE IOHEXOL 350 MG/ML SOLN COMPARISON:  Current chest radiograph. FINDINGS: Cardiovascular: There is satisfactory opacification of the pulmonary arteries to the proximal segmental level. The smaller segmental vessels less well opacified and are also affected by some respiratory motion. Allowing for this minor limitation, there is no evidence of a pulmonary embolism. Heart is normal in size and configuration. No pericardial effusion. Normal great vessels. Mediastinum/Nodes: Visualized thyroid is unremarkable. No neck base, axillary, mediastinal or hilar masses or enlarged lymph nodes. Trachea and esophagus are unremarkable. Lungs/Pleura: There are bilateral rounded areas of pleural based opacity with several additional small nodular opacities. Most prominent areas are in the right upper lobe, abutting the oblique  and lateral pleural margins of the right lower lobe and abutting the oblique fissure the left lower lobe extending to the lung base. Differential diagnosis includes septic emboli and multifocal infection/inflammation. Mild paraseptal noted at the lung apices. No other abnormalities. No pleural effusion or pneumothorax. Upper Abdomen: Unremarkable. Musculoskeletal: No chest wall abnormality. No acute or significant osseous findings. Review of the MIP images confirms the above findings. IMPRESSION: 1. No evidence of a pulmonary embolism. 2. Bilateral pleural based focal areas of lung consolidation in addition to multiple small nodules. Differential diagnosis includes septic emboli and multifocal infection/inflammation. Electronically Signed   By: Amie Portland M.D.   On: 12/28/2020 16:00   DG Chest Port 1 View  Result Date: 12/28/2020 CLINICAL DATA:  Chest pain EXAM: PORTABLE CHEST 1 VIEW COMPARISON:  Chest radiograph dated 03/11/2015. FINDINGS: The heart size and mediastinal contours are within normal limits. Both lungs are clear. The visualized skeletal structures are unremarkable. IMPRESSION: No active disease. Electronically Signed   By: Romona Curls M.D.   On: 12/28/2020 13:57    Procedures .Critical Care Performed by: Haskel Schroeder, PA-C Authorized by: Haskel Schroeder, PA-C   Critical care provider statement:    Critical care time (minutes):  45   Critical care was necessary to treat or prevent imminent or life-threatening deterioration of the following conditions:  Sepsis   Critical care was time spent personally by me on the following activities:  Discussions with consultants, evaluation of patient's response to treatment, examination of patient, ordering and performing treatments and interventions, ordering and review of laboratory studies, ordering and review of radiographic studies, pulse oximetry, re-evaluation of patient's condition, obtaining history from patient or surrogate,  review of old charts and development of treatment plan with patient or surrogate   Care discussed with: admitting provider      Medications Ordered in ED Medications  vancomycin (VANCOREADY) IVPB 1500 mg/300 mL (has no administration in time range)  vancomycin (VANCOCIN) IVPB 1000 mg/200 mL premix (has no administration in time range)  morphine 4 MG/ML injection 4 mg (4 mg Intravenous Given 12/28/20 1425)  sodium chloride 0.9 % bolus 1,000 mL (0 mLs Intravenous Stopped 12/28/20 1651)  iohexol (OMNIPAQUE) 350 MG/ML injection 100 mL (100 mLs Intravenous Contrast Given 12/28/20 1537)    ED Course  I have reviewed the  triage vital signs and the nursing notes.  Pertinent labs & imaging results that were available during my care of the patient were reviewed by me and considered in my medical decision making (see chart for details).    MDM Rules/Calculators/A&P                          Alert 30 year old male in no acute distress, nontoxic-appearing.  Patient appears uncomfortable and ill appearing.  Patient reports chest pain started 0100 this morning and has been constant since then.  Patient endorses increase in pain with movement and deep inhalation.    Patient able to speak in full complete sentences without difficulty.  Oxygen saturation 100% on room air.  Patient noted to be tachypneic at rate of 27 and tachycardic at rate of 117.  Lungs clear to auscultation bilaterally.  On cardiac auscultation S1 and S2 present with no murmurs, rubs, or gallops.  No abnormalities or lesions noted to patient's hands or nails.  Will obtain CXR, EKG, troponin, CBC, CMP.  Patient given morphine for pain management and 1 L fluid bolus.  Chest x-ray shows no active cardiopulmonary disease. EKG shows sinus tachycardia. Troponin 3.   With chest pain present for more than 6 hours lower suspicion for ACS at this time.  Patient is persistently tachycardic will order CTA chest to evaluate for possible pulmonary  embolism pending kidney function.  CMP shows sodium and chloride lowered at 130 and 96 respectively.  Creatinine and BUN within normal limits. CBC shows leukocytosis at 17.  CTA chest showed septic emboli.  Due to patient's history of IV drug use concern for infective endocarditis.  We will start patient on vancomycin.  Blood cultures, lactic acid, and USD added.  Code sepsis activated at this time.  Patient given 30 mg/kg fluid bolus of lactated Ringer's.  We will consult hospitalist for admission.  Patient is agreeable to this plan.  1735 spoke to Dr.Pahwani agreed to see the patient for admission.   Final Clinical Impression(s) / ED Diagnoses Final diagnoses:  Septic embolism [facility])    Rx / DC Orders ED Discharge Orders    None       Berneice Heinrich 12/29/20 [id number]    Eber Hong, MD 12/30/20 [phone]

## 2020-12-28 NOTE — H&P (Signed)
History and Physical    George Lucas WUJ:[id number] DOB: [birth year] DOA: 12/28/2020  PCP: System, Provider Not In  Patient coming from: Home  I have personally briefly reviewed patient's old medical records in [facility]  Chief Complaint: Chest pain  HPI: George Lucas is a 30 y.o. male with medical history significant of nicotine dependence, IVDU presented to ED with a complaint of chest pain.  Currently patient, he woke up at 10 o'clock in the morning with the chest pain.  He then had fever of 104 at around 2:30 AM.  He also started having some dry cough.  He arrived to the ED at around 2 PM as he did not have a ride.  No other complaint.  He claims that he has been clean and has not used IVDU in about 2 months but continues to smoke.  ED Course: Upon arrival to ED, he was tachycardic and tachypneic and afebrile.  CBC showed white cells of 17.  BMP showed hyponatremia and hyperglycemia.  Chest x-ray unremarkable however CT angiogram of the chest showed multifocal pneumonia and septic emboli.  Patient received vancomycin in the ED as well as 1 L of IV fluid bolus.  Hospitalist service were consulted to admit the patient for further management for possible bacterial endocarditis.  Review of Systems: As per HPI otherwise negative.    Past Medical History:  Diagnosis Date  . Hepatitis C infection 01/21/15  . Seizures (HCC)     Past Surgical History:  Procedure Laterality Date  . FRACTURE SURGERY    . HERNIA REPAIR       reports that he has been smoking cigarettes. He has a 5.00 pack-year smoking history. He quit smokeless tobacco use about 21 years ago. He reports that he does not drink alcohol and does not use drugs.  Allergies  Allergen Reactions  . Sulfa Antibiotics Anaphylaxis and Rash    Family History  Problem Relation Age of Onset  . Seizures Mother   . Seizures Father     Prior to Admission medications   Medication Sig Start Date End Date Taking?  Authorizing Provider  buprenorphine-naloxone (SUBOXONE) 8-2 mg SUBL SL tablet Place 1 tablet under the tongue 2 (two) times daily. 12/18/20   [provider]  ibuprofen (ADVIL,MOTRIN) 600 MG tablet Take 1 tablet (600 mg total) by mouth every 6 (six) hours as needed. 03/11/15   Burgess Amor, PA-C  PROAIR HFA 108 (90 Base) MCG/ACT inhaler Inhale 2 puffs into the lungs every 6 (six) hours as needed. 07/09/20   [provider]    Physical Exam: Vitals:   12/28/20 1430 12/28/20 1658 12/28/20 1702 12/28/20 1730  BP: 119/79 127/80  124/76  Pulse: 99 (!) 109  91  Resp: (!) 28 17  16   Temp:      SpO2: 100% 100%  100%  Weight:   81.6 kg   Height:        Constitutional: NAD, calm, comfortable Vitals:   12/28/20 1430 12/28/20 1658 12/28/20 1702 12/28/20 1730  BP: 119/79 127/80  124/76  Pulse: 99 (!) 109  91  Resp: (!) 28 17  16   Temp:      SpO2: 100% 100%  100%  Weight:   81.6 kg   Height:       Eyes: PERRL, lids and conjunctivae normal ENMT: Mucous membranes are moist. Posterior pharynx clear of any exudate or lesions.Normal dentition.  Neck: normal, supple, no masses, no thyromegaly Respiratory: clear to auscultation  bilaterally, no wheezing, no crackles. Normal respiratory effort. No accessory muscle use.  Cardiovascular: Regular rate and rhythm, no murmurs / rubs / gallops. No extremity edema. 2+ pedal pulses. No carotid bruits.  Abdomen: no tenderness, no masses palpated. No hepatosplenomegaly. Bowel sounds positive.  Musculoskeletal: no clubbing / cyanosis. No joint deformity upper and lower extremities. Good ROM, no contractures. Normal muscle tone.  Skin: no rashes, lesions, ulcers. No induration Neurologic: CN 2-12 grossly intact. Sensation intact, DTR normal. Strength 5/5 in all 4.  Psychiatric: Normal judgment and insight. Alert and oriented x 3. Normal mood.    Labs on Admission: I have personally reviewed following labs and imaging studies  CBC: Recent Labs   Lab 12/28/20 1325  WBC 17.0*  NEUTROABS 14.8*  HGB 15.9  HCT 46.6  MCV 87.1  PLT 161   Basic Metabolic Panel: Recent Labs  Lab 12/28/20 1325  NA 130*  K 3.6  CL 96*  CO2 24  GLUCOSE 160*  BUN 10  CREATININE 0.68  CALCIUM 8.6*   GFR: Estimated Creatinine Clearance: 158.7 mL/min (by C-G formula based on SCr of 0.68 mg/dL). Liver Function Tests: Recent Labs  Lab 12/28/20 1325  AST 33  ALT 44  ALKPHOS 76  BILITOT 0.7  PROT 7.2  ALBUMIN 3.5   No results for input(s): LIPASE, AMYLASE in the last 168 hours. No results for input(s): AMMONIA in the last 168 hours. Coagulation Profile: No results for input(s): INR, PROTIME in the last 168 hours. Cardiac Enzymes: No results for input(s): CKTOTAL, CKMB, CKMBINDEX, TROPONINI in the last 168 hours. BNP (last 3 results) No results for input(s): PROBNP in the last 8760 hours. HbA1C: No results for input(s): HGBA1C in the last 72 hours. CBG: No results for input(s): GLUCAP in the last 168 hours. Lipid Profile: No results for input(s): CHOL, HDL, LDLCALC, TRIG, CHOLHDL, LDLDIRECT in the last 72 hours. Thyroid Function Tests: No results for input(s): TSH, T4TOTAL, FREET4, T3FREE, THYROIDAB in the last 72 hours. Anemia Panel: No results for input(s): VITAMINB12, FOLATE, FERRITIN, TIBC, IRON, RETICCTPCT in the last 72 hours. Urine analysis:    Component Value Date/Time   COLORURINE YELLOW 12/28/2020 1655   APPEARANCEUR CLEAR 12/28/2020 1655   LABSPEC 1.027 12/28/2020 1655   PHURINE 6.0 12/28/2020 1655   GLUCOSEU NEGATIVE 12/28/2020 1655   HGBUR NEGATIVE 12/28/2020 1655   BILIRUBINUR NEGATIVE 12/28/2020 1655   KETONESUR NEGATIVE 12/28/2020 1655   PROTEINUR NEGATIVE 12/28/2020 1655   UROBILINOGEN 1.0 08/04/2014 2225   NITRITE NEGATIVE 12/28/2020 1655   LEUKOCYTESUR NEGATIVE 12/28/2020 1655    Radiological Exams on Admission: CT Angio Chest PE W and/or Wo Contrast  Result Date: 12/28/2020 CLINICAL DATA:  Chest pain  beginning yesterday. EXAM: CT ANGIOGRAPHY CHEST WITH CONTRAST TECHNIQUE: Multidetector CT imaging of the chest was performed using the standard protocol during bolus administration of intravenous contrast. Multiplanar CT image reconstructions and MIPs were obtained to evaluate the vascular anatomy. CONTRAST:  100mL OMNIPAQUE IOHEXOL 350 MG/ML SOLN COMPARISON:  Current chest radiograph. FINDINGS: Cardiovascular: There is satisfactory opacification of the pulmonary arteries to the proximal segmental level. The smaller segmental vessels less well opacified and are also affected by some respiratory motion. Allowing for this minor limitation, there is no evidence of a pulmonary embolism. Heart is normal in size and configuration. No pericardial effusion. Normal great vessels. Mediastinum/Nodes: Visualized thyroid is unremarkable. No neck base, axillary, mediastinal or hilar masses or enlarged lymph nodes. Trachea and esophagus are unremarkable. Lungs/Pleura: There are bilateral rounded  areas of pleural based opacity with several additional small nodular opacities. Most prominent areas are in the right upper lobe, abutting the oblique and lateral pleural margins of the right lower lobe and abutting the oblique fissure the left lower lobe extending to the lung base. Differential diagnosis includes septic emboli and multifocal infection/inflammation. Mild paraseptal noted at the lung apices. No other abnormalities. No pleural effusion or pneumothorax. Upper Abdomen: Unremarkable. Musculoskeletal: No chest wall abnormality. No acute or significant osseous findings. Review of the MIP images confirms the above findings. IMPRESSION: 1. No evidence of a pulmonary embolism. 2. Bilateral pleural based focal areas of lung consolidation in addition to multiple small nodules. Differential diagnosis includes septic emboli and multifocal infection/inflammation. Electronically Signed   By: Amie Portland M.D.   On: 12/28/2020 16:00    DG Chest Port 1 View  Result Date: 12/28/2020 CLINICAL DATA:  Chest pain EXAM: PORTABLE CHEST 1 VIEW COMPARISON:  Chest radiograph dated 03/11/2015. FINDINGS: The heart size and mediastinal contours are within normal limits. Both lungs are clear. The visualized skeletal structures are unremarkable. IMPRESSION: No active disease. Electronically Signed   By: Romona Curls M.D.   On: 12/28/2020 13:57    EKG: Independently reviewed.  It shows sinus tachycardia, right atrial enlargement and right ventricular hypertrophy.  Assessment/Plan Active Problems:   Multifocal pneumonia   Sepsis (HCC)   Septic embolism (HCC)   Severe sepsis secondary to multifocal pneumonia, POA: Patient meets sepsis criteria based on tachycardia, tachypnea, leukocytosis and multifocal pneumonia.  Lactic acid 2.5..  He received vancomycin in the ED.  I will continue that and add cefepime.  Due to history of IVDU and septic emboli on the CT chest, highly suspect possible bacterial endocarditis.  Blood culture drawn in the ED.  Will order transthoracic echo.  May need TEE if blood culture turns out to be positive or any suspicion of endocarditis on TTE.  Monitor on telemetry.  Repeat labs in the morning.  He has received only 1 L of IV fluid bolus, I have requested ED physician to complete fluid bolus per protocol which will be 30 mL/kg.  I will then start him on 100 cc/h normal saline.  COVID-negative.  Will check respiratory viral panel and influenza.  Troponins negative.  Repeat lactic acid in 4 hours.  Acute hyponatremia: Likely hypovolemic.  He is getting IV fluids in the ED and will continue on normal saline.  We will repeat labs in the morning.  Hyperglycemia: Blood sugar 160.  No history of diabetes.  Check hemoglobin A1c.  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  UDS pending.  DVT prophylaxis: enoxaparin (LOVENOX) injection 40 mg Start: 12/28/20 1800 Code Status: Full code Family  Communication: None present at bedside.  Plan of care discussed with patient in length and he verbalized understanding and agreed with it. Disposition Plan: May need prolonged hospitalization if turns out to be bacteremic or bacterial endocarditis. Consults called: None Admission status: Inpatient   Status is: Inpatient  Remains inpatient appropriate because:Inpatient level of care appropriate due to severity of illness   Dispo: The patient is from: Home              Anticipated d/c is to: Home              Patient currently is not medically stable to d/c.   Difficult to place patient No       Hughie Closs MD [facility]  12/28/2020, 6:02 PM  To contact the attending provider between 7A-7P or the covering provider during after hours 7P-7A, please log into the web site [email or web address]

## 2020-12-28 NOTE — ED Notes (Signed)
Notified CT pt has appropriate IV access.

## 2020-12-28 NOTE — ED Notes (Signed)
Per Dr. Thomes Dinning, continue to monitor and perform the ordered lactic acid at 10 pm.

## 2020-12-28 NOTE — ED Notes (Signed)
Receiving nurse in pt's room. This nurse will call back in a few minutes for report.

## 2020-12-28 NOTE — ED Provider Notes (Signed)
Pt is a 30 y/o male - uses IVD - has had CP - with breathing - has had some subjective fevers - he has a tachycarida - no murmur - and unfortunately there is multiple septic emboli in his lungs - needs admission for sepsis - septic emboli.   Critically ill I agree with Mr. Iona Coach documentation of critical care  Medical screening examination/treatment/procedure(s) were conducted as a shared visit with non-physician practitioner(s) and myself.  I personally evaluated the patient during the encounter.  Clinical Impression:   Final diagnoses:  Septic embolism (HCC)         Eber Hong, MD 12/30/20 [phone]

## 2020-12-28 NOTE — ED Triage Notes (Signed)
Chest pain onset yesterday 

## 2020-12-28 NOTE — ED Notes (Signed)
Pt provided dinner tray, sitting up eating. Updated on POC for admission and he expressed understanding. IV antibiotics infusing without complications.

## 2020-12-28 NOTE — ED Notes (Signed)
Notified Dr. Jacqulyn Bath of alert lactic acid of 2.5.

## 2020-12-28 NOTE — ED Notes (Signed)
Notified Dr. Thomes Dinning of pt's lactic acid 3.5 result.

## 2020-12-28 NOTE — ED Notes (Signed)
X-ray at bedside

## 2020-12-29 ENCOUNTER — Inpatient Hospital Stay (HOSPITAL_COMMUNITY): Payer: Medicaid Other | Admitting: Anesthesiology

## 2020-12-29 ENCOUNTER — Inpatient Hospital Stay (HOSPITAL_COMMUNITY): Payer: Medicaid Other

## 2020-12-29 DIAGNOSIS — I38 Endocarditis, valve unspecified: Secondary | ICD-10-CM | POA: Diagnosis not present

## 2020-12-29 DIAGNOSIS — J189 Pneumonia, unspecified organism: Secondary | ICD-10-CM | POA: Diagnosis not present

## 2020-12-29 LAB — BLOOD CULTURE ID PANEL (REFLEXED) - BCID2

## 2020-12-29 LAB — BASIC METABOLIC PANEL
Anion gap: 8 (ref 5–15)
BUN: 8 mg/dL (ref 6–20)
CO2: 26 mmol/L (ref 22–32)
Calcium: 8.1 mg/dL — ABNORMAL LOW (ref 8.9–10.3)
Chloride: 97 mmol/L — ABNORMAL LOW (ref 98–111)
Creatinine, Ser: 0.62 mg/dL (ref 0.61–1.24)
GFR, Estimated: >60 mL/min (ref >60)
Glucose, Bld: 107 mg/dL — ABNORMAL HIGH (ref 70–99)
Potassium: 3.5 mmol/L (ref 3.5–5.1)
Sodium: 131 mmol/L — ABNORMAL LOW (ref 135–145)

## 2020-12-29 LAB — ECHOCARDIOGRAM COMPLETE
Area-P 1/2: 2.58 cm2
Height: 74 in
S' Lateral: 2.62 cm
Weight: 2880 oz

## 2020-12-29 LAB — CBC
HCT: 44.2 % (ref 39.0–52.0)
Hemoglobin: 14.4 g/dL (ref 13.0–17.0)
MCH: 29.3 pg (ref 26.0–34.0)
MCHC: 32.6 g/dL (ref 30.0–36.0)
MCV: 89.8 fL (ref 80.0–100.0)
Platelets: 140 10*3/uL — ABNORMAL LOW (ref 150–400)
RBC: 4.92 MIL/uL (ref 4.22–5.81)
RDW: 13 % (ref 11.5–15.5)
WBC: 12.6 10*3/uL — ABNORMAL HIGH (ref 4.0–10.5)
nRBC: 0 % (ref 0.0–0.2)

## 2020-12-29 LAB — RESPIRATORY PANEL BY PCR

## 2020-12-29 LAB — PROTIME-INR
INR: 1.1 (ref 0.8–1.2)
Prothrombin Time: 14.4 seconds (ref 11.4–15.2)

## 2020-12-29 LAB — CORTISOL-AM, BLOOD: Cortisol - AM: 24.1 ug/dL — ABNORMAL HIGH (ref 6.7–22.6)

## 2020-12-29 LAB — LACTIC ACID, PLASMA: Lactic Acid, Venous: 1.5 mmol/L (ref 0.5–1.9)

## 2020-12-29 LAB — MRSA PCR SCREENING: MRSA by PCR: POSITIVE — AB

## 2020-12-29 LAB — PROCALCITONIN: Procalcitonin: 3.55 ng/mL

## 2020-12-29 MED ORDER — KETOROLAC TROMETHAMINE 30 MG/ML IJ SOLN
30.0000 mg | Freq: Four times a day (QID) | INTRAMUSCULAR | Status: AC | PRN
  Administered 2020-12-29 – 2021-01-03 (×16): 30 mg via INTRAVENOUS
  Filled 2020-12-29 (×15): qty 1

## 2020-12-29 MED ORDER — CHLORHEXIDINE GLUCONATE CLOTH 2 % EX PADS
6.0000 | MEDICATED_PAD | Freq: Every day | CUTANEOUS | Status: AC
  Administered 2020-12-29 – 2021-01-02 (×3): 6 via TOPICAL

## 2020-12-29 MED ORDER — OXYCODONE-ACETAMINOPHEN 5-325 MG PO TABS
1.0000 | ORAL_TABLET | Freq: Four times a day (QID) | ORAL | Status: DC | PRN
Start: 2020-12-29 — End: 2021-01-04
  Administered 2020-12-30 – 2021-01-02 (×5): 1 via ORAL
  Filled 2020-12-29 (×6): qty 1

## 2020-12-29 MED ORDER — MUPIROCIN 2 % EX OINT
1.0000 "application " | TOPICAL_OINTMENT | Freq: Two times a day (BID) | CUTANEOUS | Status: AC
  Administered 2020-12-29 – 2021-01-02 (×10): 1 via NASAL
  Filled 2020-12-29: qty 22

## 2020-12-29 NOTE — Progress Notes (Signed)
      INFECTIOUS DISEASE ATTENDING ADDENDUM:   Date: 12/29/2020  Patient name: George Lucas  Medical record number: [id number]  Date of birth: 09-11-1990        [facility] Antimicrobial Management Team Staphylococcus aureus bacteremia   Staphylococcus aureus bacteremia (SAB) is associated with a high rate of complications and mortality.  Specific aspects of clinical management are critical to optimizing the outcome of patients with SAB.  Therefore, the [facility] Antimicrobial Management Team [facility]) has initiated an intervention aimed at improving the management of SAB at [facility].  To do so, Infectious Diseases physicians are providing an evidence-based consult for the management of all patients with SAB.     Yes No Comments  Perform follow-up blood cultures (even if the patient is afebrile) to ensure clearance of bacteremia [x]  []    Remove vascular catheter and obtain follow-up blood cultures after the removal of the catheter [x]  []    Perform echocardiography to evaluate for endocarditis (transthoracic ECHO is 40-50% sensitive, TEE is > 90% sensitive) [x]  []  Please keep in mind, that neither test can definitively EXCLUDE endocarditis, and that should clinical suspicion remain high for endocarditis the patient should then still be treated with an "endocarditis" duration of therapy = 6 weeks  Consult electrophysiologist to evaluate implanted cardiac device (pacemaker, ICD) []  []  NA  Ensure source control []  []  Have all abscesses been drained effectively? Have deep seeded infections (septic joints or osteomyelitis) had appropriate surgical debridement?  Investigate for "metastatic" sites of infection []  []  Does the patient have ANY symptom or physical exam finding that would suggest a deeper infection (back or neck pain that may be suggestive of vertebral osteomyelitis or epidural abscess, muscle pain that could be a symptom of pyomyositis)?  Keep in mind that for deep seeded  infections MRI imaging with contrast is preferred rather than other often insensitive tests such as plain x-rays, especially early in a patient's presentation.  Change antibiotic therapy to vancomycin []  []  Beta-lactam antibiotics are preferred for MSSA due to higher cure rates.   If on Vancomycin, goal trough should be 15 - 20 mcg/mL  Estimated duration of IV antibiotic therapy:    Given IVDU he will need to stay in house for conventional therapy. Hopefully he is willing to stay for at least 2 weeks if not full 6 weeks for endocarditis []  []  Consult case management for probably prolonged outpatient IV antibiotic therapy   IVDU: check hepatitis panel   12/29/2020, 6:42 PM

## 2020-12-29 NOTE — Progress Notes (Signed)
2d echo completed.   Tabitha Riggins, RDCS 

## 2020-12-29 NOTE — Progress Notes (Addendum)
    [facility] has been requested to perform a transesophageal echocardiogram on George Lucas for endocarditis.  After careful review of history and examination, the risks and benefits of transesophageal echocardiogram have been explained including risks of esophageal damage, perforation (1:10,000 risk), bleeding, pharyngeal hematoma as well as other potential complications associated with conscious sedation including aspiration, arrhythmia, respiratory failure and death. Alternatives to treatment were discussed, questions were answered. Patient is willing to proceed.   Dr. Diona Browner with do tomorrow at 2:00 PM, earliest available per PACU. Discussed with patient who is will to proceed.   George Reedy, PA-C  12/29/2020 12:15 PM    Attending note:  Patient with history of IVDU, currently admitted with sepsis, multifocal pneumonia with indication of septic emboli by chest CT.  Blood cultures positive for gram-positive cocci.  Surface echocardiogram shows thickened mitral valve but no definitive vegetation, otherwise views are very limited.  Patient reporting diffuse pain, somewhat agitated.  He was admitted on May 8 with UDS positive for amphetamines and opiates, negative for cocaine.  Earliest available slot for TEE tomorrow at 2 PM per PACU.  Vital signs are stable now.  Keep n.p.o. after midnight.  Jonelle Sidle, M.D., F.A.C.C.

## 2020-12-29 NOTE — Progress Notes (Signed)
PROGRESS NOTE    George Lucas  YWV:[id number] DOB: [birth year] DOA: 12/28/2020 PCP: System, Provider Not In   Brief Narrative:   George Lucas is a 30 y.o. male with medical history significant of nicotine dependence, IVDU presented to ED with a complaint of chest pain.  Patient was admitted with severe sepsis secondary to multifocal pneumonia with some findings of septic emboli on CT chest.  He has history of IV drug abuse with methamphetamine use and currently tests positive.  Blood cultures with gram-positive cocci noted.  TTE negative for endocarditis.  He will require pending work-up with TEE.  Assessment & Plan:   Active Problems:   Multifocal pneumonia   Severe sepsis (HCC)   Septic embolism (HCC)   Acute hyponatremia   Severe sepsis present admission secondary to multifocal pneumonia, now with gram-positive bacteremia -High suspicion of endocarditis given IVDA -TTE with LVEF 55-60% and no findings of endocarditis noted -Cardiology consultation for evaluation for TEE, keep n.p.o. after midnight -Repeat blood cultures in a.m. -Continue on vancomycin and cefepime -MRSA PCR positive -Lactic acid has down trended, no further need for repeat -Leukocytosis downtrending -Recheck a.m. labs along with procalcitonin  Acute hyponatremia-likely hypovolemic -Currently stable, continue IV normal saline -Recheck a.m. labs  Hyperglycemia-resolved -Hemoglobin A1c 5.3%  History of IV drug use -Urine drug screen positive for methamphetamines even though patient claims he stopped 2 months prior -Patient will likely require placement upon completion of IV drug therapy upon discharge  DVT prophylaxis:Lovenox Code Status: Full Family Communication: None at bedside; pt will call Disposition Plan:  Status is: Inpatient  Remains inpatient appropriate because:Ongoing diagnostic testing needed not appropriate for outpatient work up, IV treatments appropriate due to intensity of  illness or inability to take PO and Inpatient level of care appropriate due to severity of illness   Dispo: The patient is from: Home              Anticipated d/c is to: LTAC              Patient currently is not medically stable to d/c.   Difficult to place patient No  Consultants:   Cardiology  Procedures:   See below  Antimicrobials:  Anti-infectives (From admission, onward)   Start     Dose/Rate Route Frequency Ordered Stop   12/29/20 0400  ceFEPIme (MAXIPIME) 2 g in sodium chloride 0.9 % 100 mL IVPB        2 g 200 mL/hr over 30 Minutes Intravenous Every 8 hours 12/28/20 1802     12/29/20 0100  vancomycin (VANCOCIN) IVPB 1000 mg/200 mL premix        1,000 mg 200 mL/hr over 60 Minutes Intravenous Every 8 hours 12/28/20 1710     12/28/20 1800  vancomycin (VANCOCIN) IVPB 1000 mg/200 mL premix  Status:  Discontinued        1,000 mg 200 mL/hr over 60 Minutes Intravenous  Once 12/28/20 1750 12/28/20 1755   12/28/20 1800  ceFEPIme (MAXIPIME) 2 g in sodium chloride 0.9 % 100 mL IVPB        2 g 200 mL/hr over 30 Minutes Intravenous  Once 12/28/20 1750 12/28/20 1851   12/28/20 1715  vancomycin (VANCOREADY) IVPB 1500 mg/300 mL        1,500 mg 150 mL/hr over 120 Minutes Intravenous  Once 12/28/20 1707 12/28/20 1917       Subjective: Patient seen and evaluated today with ongoing pain in his chest that is worsened with breathing.  No acute concerns or events noted overnight.  Objective: Vitals:   12/29/20 0433 12/29/20 0521 12/29/20 0633 12/29/20 0855  BP: 131/77  118/71 129/83  Pulse: 89  82 87  Resp:    18  Temp: (!) 101.4 F (38.6 C) 100 F (37.8 C) 99.4 F (37.4 C) 98.9 F (37.2 C)  TempSrc: Oral Oral Oral Oral  SpO2: 99%  99% 100%  Weight:      Height:        Intake/Output Summary (Last 24 hours) at 12/29/2020 1128 Last data filed at 12/29/2020 0900 Gross per 24 hour  Intake 3280.18 ml  Output --  Net 3280.18 ml   Filed Weights   12/28/20 1702  Weight: 81.6 kg     Examination:  General exam: Appears calm and comfortable  Respiratory system: Clear to auscultation. Respiratory effort normal. Cardiovascular system: S1 & S2 heard, RRR.  Gastrointestinal system: Abdomen is soft Central nervous system: Alert and awake Extremities: No edema Skin: No significant lesions noted, tattoos throughout Psychiatry: Flat affect.    Data Reviewed: I have personally reviewed following labs and imaging studies  CBC: Recent Labs  Lab 12/28/20 1325 12/29/20 0410  WBC 17.0* 12.6*  NEUTROABS 14.8*  --   HGB 15.9 14.4  HCT 46.6 44.2  MCV 87.1 89.8  PLT 161 140*   Basic Metabolic Panel: Recent Labs  Lab 12/28/20 1325 12/28/20 1601 12/29/20 0410  NA 130*  --  131*  K 3.6  --  3.5  CL 96*  --  97*  CO2 24  --  26  GLUCOSE 160*  --  107*  BUN 10  --  8  CREATININE 0.68  --  0.62  CALCIUM 8.6*  --  8.1*  MG  --  1.8  --    GFR: Estimated Creatinine Clearance: 157.3 mL/min (by C-G formula based on SCr of 0.62 mg/dL). Liver Function Tests: Recent Labs  Lab 12/28/20 1325  AST 33  ALT 44  ALKPHOS 76  BILITOT 0.7  PROT 7.2  ALBUMIN 3.5   No results for input(s): LIPASE, AMYLASE in the last 168 hours. No results for input(s): AMMONIA in the last 168 hours. Coagulation Profile: Recent Labs  Lab 12/29/20 0410  INR 1.1   Cardiac Enzymes: No results for input(s): CKTOTAL, CKMB, CKMBINDEX, TROPONINI in the last 168 hours. BNP (last 3 results) No results for input(s): PROBNP in the last 8760 hours. HbA1C: Recent Labs    12/28/20 1325  HGBA1C 5.3   CBG: No results for input(s): GLUCAP in the last 168 hours. Lipid Profile: No results for input(s): CHOL, HDL, LDLCALC, TRIG, CHOLHDL, LDLDIRECT in the last 72 hours. Thyroid Function Tests: No results for input(s): TSH, T4TOTAL, FREET4, T3FREE, THYROIDAB in the last 72 hours. Anemia Panel: No results for input(s): VITAMINB12, FOLATE, FERRITIN, TIBC, IRON, RETICCTPCT in the last 72  hours. Sepsis Labs: Recent Labs  Lab 12/28/20 1720 12/28/20 1841 12/28/20 2204 12/29/20 0410  PROCALCITON  --   --   --  3.55  LATICACIDVEN 2.5* 3.5* 2.2* 1.5    Recent Results (from the past 240 hour(s))  Resp Panel by RT-PCR (Flu A&B, Covid) Nasopharyngeal Swab     Status: None   Collection Time: 12/28/20  2:29 PM   Specimen: Nasopharyngeal Swab; Nasopharyngeal(NP) swabs in vial transport medium  Result Value Ref Range Status   SARS Coronavirus 2 by RT PCR NEGATIVE NEGATIVE Final    Comment: (NOTE) SARS-CoV-2 target nucleic acids are NOT DETECTED.  The SARS-CoV-2 RNA is generally detectable in upper respiratory specimens during the acute phase of infection. The lowest concentration of SARS-CoV-2 viral copies this assay can detect is 138 copies/mL. A negative result does not preclude SARS-Cov-2 infection and should not be used as the sole basis for treatment or other patient management decisions. A negative result may occur with  improper specimen collection/handling, submission of specimen other than nasopharyngeal swab, presence of viral mutation(s) within the areas targeted by this assay, and inadequate number of viral copies(<138 copies/mL). A negative result must be combined with clinical observations, patient history, and epidemiological information. The expected result is Negative.  Fact Sheet for Patients:  BloggerCourse.com  Fact Sheet for Healthcare Providers:  SeriousBroker.it  This test is no t yet approved or cleared by the [place] FDA and  has been authorized for detection and/or diagnosis of SARS-CoV-2 by FDA under an Emergency Use Authorization (EUA). This EUA will remain  in effect (meaning this test can be used) for the duration of the COVID-19 declaration under Section 564(b)(1) of the Act, 21 U.S.C.section 360bbb-3(b)(1), unless the authorization is terminated  or revoked sooner.        Influenza A by PCR NEGATIVE NEGATIVE Final   Influenza B by PCR NEGATIVE NEGATIVE Final    Comment: (NOTE) The Xpert Xpress SARS-CoV-2/FLU/RSV plus assay is intended as an aid in the diagnosis of influenza from Nasopharyngeal swab specimens and should not be used as a sole basis for treatment. Nasal washings and aspirates are unacceptable for Xpert Xpress SARS-CoV-2/FLU/RSV testing.  Fact Sheet for Patients: BloggerCourse.com  Fact Sheet for Healthcare Providers: SeriousBroker.it  This test is not yet approved or cleared by the [place] FDA and has been authorized for detection and/or diagnosis of SARS-CoV-2 by FDA under an Emergency Use Authorization (EUA). This EUA will remain in effect (meaning this test can be used) for the duration of the COVID-19 declaration under Section 564(b)(1) of the Act, 21 U.S.C. section 360bbb-3(b)(1), unless the authorization is terminated or revoked.  Performed at [facility], [address] [place], [place] [address]   Blood culture (routine x 2)     Status: None (Preliminary result)   Collection Time: 12/28/20  5:21 PM   Specimen: BLOOD RIGHT HAND  Result Value Ref Range Status   Specimen Description BLOOD RIGHT HAND  Final   Special Requests   Final    BOTTLES DRAWN AEROBIC AND ANAEROBIC Blood Culture adequate volume   Culture  Setup Time   Final    GRAM POSITIVE COCCI BOTH ANAEROBIC AND AEROBIC BOTTLES Gram Stain Report Called to,Read Back By and Verified With: HOWARDTON,M@0727  BY MATTHEWS, B 5.9.22    Culture   Final    NO GROWTH < 24 HOURS Performed at [facility], [address], [place], [place] [address]    Report Status PENDING  Incomplete  Blood culture (routine x 2)     Status: None (Preliminary result)   Collection Time: 12/28/20  5:21 PM   Specimen: BLOOD LEFT HAND  Result Value Ref Range Status   Specimen Description BLOOD LEFT HAND  Final   Special Requests   Final     BOTTLES DRAWN AEROBIC AND ANAEROBIC Blood Culture adequate volume   Culture  Setup Time   Final    GRAM POSITIVE COCCI ANAEROBIC AND AEROBIC BOTTLES Gram Stain Report Called to,Read Back By and Verified With: HOWARDTON,M@0727  BY MATTHEWS, B 5.9.22    Culture   Final    NO GROWTH <  24 HOURS Performed at [facility], [address] [place], [place] [address]    Report Status PENDING  Incomplete  MRSA PCR Screening     Status: Abnormal   Collection Time: 12/29/20  7:32 AM   Specimen: Nasal Mucosa; Nasopharyngeal  Result Value Ref Range Status   MRSA by PCR POSITIVE (A) NEGATIVE Final    Comment:        The GeneXpert MRSA Assay (FDA approved for NASAL specimens only), is one component of a comprehensive MRSA colonization surveillance program. It is not intended to diagnose MRSA infection nor to guide or monitor treatment for MRSA infections. RESULT CALLED TO, READ BACK BY AND VERIFIED WITH: HOWERTON M. AT 1018AM ON [id number] BY THOMPSON S. Performed at [facility], [address] [place], [place] [address]          Radiology Studies: CT Angio Chest PE W and/or Wo Contrast  Result Date: 12/28/2020 CLINICAL DATA:  Chest pain beginning yesterday. EXAM: CT ANGIOGRAPHY CHEST WITH CONTRAST TECHNIQUE: Multidetector CT imaging of the chest was performed using the standard protocol during bolus administration of intravenous contrast. Multiplanar CT image reconstructions and MIPs were obtained to evaluate the vascular anatomy. CONTRAST:  OMNIPAQUE IOHEXOL 350 MG/ML SOLN COMPARISON:  Current chest radiograph. FINDINGS: Cardiovascular: There is satisfactory opacification of the pulmonary arteries to the proximal segmental level. The smaller segmental vessels less well opacified and are also affected by some respiratory motion. Allowing for this minor limitation, there is no evidence of a pulmonary embolism. Heart is normal in size and configuration. No pericardial effusion. Normal  great vessels. Mediastinum/Nodes: Visualized thyroid is unremarkable. No neck base, axillary, mediastinal or hilar masses or enlarged lymph nodes. Trachea and esophagus are unremarkable. Lungs/Pleura: There are bilateral rounded areas of pleural based opacity with several additional small nodular opacities. Most prominent areas are in the right upper lobe, abutting the oblique and lateral pleural margins of the right lower lobe and abutting the oblique fissure the left lower lobe extending to the lung base. Differential diagnosis includes septic emboli and multifocal infection/inflammation. Mild paraseptal noted at the lung apices. No other abnormalities. No pleural effusion or pneumothorax. Upper Abdomen: Unremarkable. Musculoskeletal: No chest wall abnormality. No acute or significant osseous findings. Review of the MIP images confirms the above findings. IMPRESSION: 1. No evidence of a pulmonary embolism. 2. Bilateral pleural based focal areas of lung consolidation in addition to multiple small nodules. Differential diagnosis includes septic emboli and multifocal infection/inflammation. Electronically Signed   By: Amie Portland M.D.   On: 12/28/2020 16:00   DG Chest Port 1 View  Result Date: 12/28/2020 CLINICAL DATA:  Chest pain EXAM: PORTABLE CHEST 1 VIEW COMPARISON:  Chest radiograph dated 03/11/2015. FINDINGS: The heart size and mediastinal contours are within normal limits. Both lungs are clear. The visualized skeletal structures are unremarkable. IMPRESSION: No active disease. Electronically Signed   By: Romona Curls M.D.   On: 12/28/2020 13:57   ECHOCARDIOGRAM COMPLETE  Result Date: 12/29/2020    ECHOCARDIOGRAM REPORT   Patient Name:   [facility] Date of Exam: 12/29/2020 Medical Rec #:  [id number]         Height:       74.0 in Accession #:    [id number]        Weight:       180.0 lb Date of Birth:  10/02/1990          BSA:          2.078  m Patient Age:    30 years          BP:           129/83  mmHg Patient Gender: M                 HR:           87 bpm. [facility]am [facility] Procedure: 2D Echo Indications:    Endocarditis I38  History:        Patient has no prior history of Echocardiogram examinations.                 Signs/Symptoms:Chest Pain and Fever; Risk Factors:Current                 Smoker. IVDU.  Sonographer:    Jeryl Columbia RDCS (AE) Referring Phys: [id number] Lamont Dowdy [facility]  Sonographer Comments: Patient complaining of pain throughout test. Moving and sitting up throughout test. IMPRESSIONS  1. Left ventricular ejection fraction, by estimation, is 55 to 60%. The left ventricle has normal function. Left ventricular endocardial border not optimally defined to evaluate regional wall motion. Left ventricular diastolic parameters were normal.  2. Right ventricular systolic function is normal. The right ventricular size is normal. Tricuspid regurgitation signal is inadequate for assessing PA pressure.  3. The mitral valve is abnormal, mildly thickened but without diagnostic vegetation. No evidence of mitral valve regurgitation.  4. The aortic valve was not well visualized. Aortic valve regurgitation is not visualized.  5. Unable to estimate CVP.  6. If there is high suspicion for endocarditis, TEE could be considered given overall limited views. FINDINGS  Left Ventricle: Left ventricular ejection fraction, by estimation, is 55 to 60%. The left ventricle has normal function. Left ventricular endocardial border not optimally defined to evaluate regional wall motion. The left ventricular internal cavity size was normal in size. There is no left ventricular hypertrophy. Left ventricular diastolic parameters were normal. Right Ventricle: The right ventricular size is normal. No increase in right ventricular wall thickness. Right ventricular systolic function is normal. Tricuspid regurgitation signal is inadequate for assessing PA pressure. Left Atrium: Left atrial size was normal in size. Right  Atrium: Right atrial size was normal in size. Pericardium: There is no evidence of pericardial effusion. Mitral Valve: The mitral valve is abnormal. There is mild thickening of the mitral valve leaflet(s). No evidence of mitral valve regurgitation. Tricuspid Valve: The tricuspid valve is not well visualized. Tricuspid valve regurgitation is trivial. Aortic Valve: The aortic valve was not well visualized. Aortic valve regurgitation is not visualized. Pulmonic Valve: The pulmonic valve was grossly normal. Pulmonic valve regurgitation is trivial. Aorta: The aortic root is normal in size and structure. Venous: Unable to estimate CVP. The inferior vena cava was not well visualized. IAS/Shunts: No atrial level shunt detected by color flow Doppler.  LEFT VENTRICLE PLAX 2D LVIDd:         4.06 cm Diastology LVIDs:         2.62 cm LV e' medial:    11.40 cm/s LV PW:         1.07 cm LV E/e' medial:  8.7 LV IVS:        0.74 cm LV e' lateral:   17.70 cm/s                        LV E/e' lateral: 5.6  LEFT ATRIUM  Index LA diam:      2.50 cm 1.20 cm/m LA Vol (A4C): 45.2 ml 21.75 ml/m   AORTA Ao Root diam: 3.10 cm MITRAL VALVE MV Area (PHT): 2.58 cm MV Decel Time: 294 msec MV E velocity: 99.30 cm/s MV A velocity: 82.80 cm/s MV E/A ratio:  1.20 Nona Dell MD Electronically signed by Nona Dell MD Signature Date/Time: 12/29/2020/10:58:01 AM    Final         Scheduled Meds: . buprenorphine-naloxone  1 tablet Sublingual BID  . Chlorhexidine Gluconate Cloth  6 each Topical Q0600  . enoxaparin (LOVENOX) injection  40 mg Subcutaneous Q24H  . mupirocin ointment  1 application Nasal BID   Continuous Infusions: . sodium chloride 100 mL/hr at 12/29/20 0409  . ceFEPime (MAXIPIME) IV 2 g (12/29/20 0411)  . vancomycin 1,000 mg (12/29/20 0843)     LOS: 1 day    Time spent: 35 minutes    Kiarrah Rausch D Sherryll Burger, DO [facility]  If 7PM-7AM, please contact night-coverage [email or web address] 12/29/2020, 11:28 AM

## 2020-12-29 NOTE — Progress Notes (Signed)
Patient has a temp of 101.4. MD Adefeso notified.

## 2020-12-29 NOTE — Progress Notes (Signed)
Lab called regarding critical lactic acid of 2.2. MD Adefeso notified.

## 2020-12-30 ENCOUNTER — Inpatient Hospital Stay (HOSPITAL_COMMUNITY): Payer: Medicaid Other

## 2020-12-30 ENCOUNTER — Encounter (HOSPITAL_COMMUNITY): Payer: Self-pay | Admitting: Family Medicine

## 2020-12-30 ENCOUNTER — Encounter (HOSPITAL_COMMUNITY)
Admission: EM | Disposition: A | Discharge status: Home / Self Care | Payer: Self-pay | Source: Home / Self Care | Attending: Family Medicine

## 2020-12-30 DIAGNOSIS — A419 Sepsis, unspecified organism: Secondary | ICD-10-CM | POA: Diagnosis not present

## 2020-12-30 DIAGNOSIS — R6520 Severe sepsis without septic shock: Secondary | ICD-10-CM | POA: Diagnosis not present

## 2020-12-30 LAB — C-REACTIVE PROTEIN: CRP: 24.9 mg/dL — ABNORMAL HIGH (ref <1.0)

## 2020-12-30 LAB — CBC
HCT: 39.8 % (ref 39.0–52.0)
Hemoglobin: 13.4 g/dL (ref 13.0–17.0)
MCH: 29.7 pg (ref 26.0–34.0)
MCHC: 33.7 g/dL (ref 30.0–36.0)
MCV: 88.2 fL (ref 80.0–100.0)
Platelets: 160 10*3/uL (ref 150–400)
RBC: 4.51 MIL/uL (ref 4.22–5.81)
RDW: 13.2 % (ref 11.5–15.5)
WBC: 11.6 10*3/uL — ABNORMAL HIGH (ref 4.0–10.5)
nRBC: 0 % (ref 0.0–0.2)

## 2020-12-30 LAB — BASIC METABOLIC PANEL
Anion gap: 8 (ref 5–15)
BUN: 9 mg/dL (ref 6–20)
CO2: 25 mmol/L (ref 22–32)
Calcium: 7.8 mg/dL — ABNORMAL LOW (ref 8.9–10.3)
Chloride: 99 mmol/L (ref 98–111)
Creatinine, Ser: 0.57 mg/dL — ABNORMAL LOW (ref 0.61–1.24)
GFR, Estimated: >60 mL/min (ref >60)
Glucose, Bld: 115 mg/dL — ABNORMAL HIGH (ref 70–99)
Potassium: 3.3 mmol/L — ABNORMAL LOW (ref 3.5–5.1)
Sodium: 132 mmol/L — ABNORMAL LOW (ref 135–145)

## 2020-12-30 LAB — MAGNESIUM: Magnesium: 1.8 mg/dL (ref 1.7–2.4)

## 2020-12-30 LAB — SEDIMENTATION RATE: Sed Rate: 42 mm/hr — ABNORMAL HIGH (ref 0–16)

## 2020-12-30 LAB — HEPATITIS PANEL, ACUTE
HCV Ab: REACTIVE — AB
Hep A IgM: NONREACTIVE
Hep B C IgM: NONREACTIVE
Hepatitis B Surface Ag: NONREACTIVE

## 2020-12-30 LAB — PROCALCITONIN: Procalcitonin: 2.75 ng/mL

## 2020-12-30 SURGERY — ECHOCARDIOGRAM, TRANSESOPHAGEAL
Anesthesia: General

## 2020-12-30 MED ORDER — PROPOFOL 10 MG/ML IV BOLUS
INTRAVENOUS | Status: AC
  Filled 2020-12-30: qty 20

## 2020-12-30 MED ORDER — DEXMEDETOMIDINE (PRECEDEX) IN NS 20 MCG/5ML (4 MCG/ML) IV SYRINGE
PREFILLED_SYRINGE | INTRAVENOUS | Status: AC
  Filled 2020-12-30: qty 5

## 2020-12-30 MED ORDER — POTASSIUM CHLORIDE CRYS ER 20 MEQ PO TBCR
40.0000 meq | EXTENDED_RELEASE_TABLET | Freq: Once | ORAL | Status: AC
  Administered 2020-12-30: 40 meq via ORAL
  Filled 2020-12-30: qty 2

## 2020-12-30 MED ORDER — MIDAZOLAM HCL 2 MG/2ML IJ SOLN
INTRAMUSCULAR | Status: AC
  Filled 2020-12-30: qty 2

## 2020-12-30 MED ORDER — SODIUM CHLORIDE 0.9 % IV SOLN: INTRAVENOUS | Status: AC

## 2020-12-30 MED ORDER — LIDOCAINE VISCOUS HCL 2 % MT SOLN
OROMUCOSAL | Status: AC
  Filled 2020-12-30: qty 15

## 2020-12-30 MED ORDER — LACTATED RINGERS IV SOLN: INTRAVENOUS | Status: AC | PRN

## 2020-12-30 NOTE — Progress Notes (Signed)
   Progress Note  Patient Name: George Lucas Date of Encounter: 12/30/2020  Patient presented to PACU for TEE this afternoon to rule out endocarditis in light of MRSA bacteremia and history of IV drug abuse.  He had difficulty tolerating the transthoracic study due to diffuse pain and also inability to position himself flat.  He states that he gets significantly short of breath when he lays down, was not comfortable proceeding today.  Chest CTA from May 8 showed bilateral pleural-based lung consolidation and also multiple small nodules raising the possibility of septic emboli/infection.  He states that he is feeling better by the day.  For now we are canceling TEE, can reassess in the next few days to see if he is ready to proceed.  He continues on antibiotics and is afebrile at this time.  I communicated this to Dr. Sherryll Burger.  Signed, Nona Dell, MD  12/30/2020, 2:09 PM

## 2020-12-30 NOTE — Progress Notes (Signed)
PROGRESS NOTE    [name]  ZOX:[id number] DOB: 1991-01-26 DOA: 12/28/2020 PCP: System, Provider Not In   Brief Narrative:   George Lucas a 30 y.o.malewith medical history significant ofnicotine dependence, IVDU presented to ED with a complaint of chest pain.  Patient was admitted with severe sepsis secondary to multifocal pneumonia with some findings of septic emboli on CT chest.  He has history of IV drug abuse with methamphetamine use and currently tests positive.  Blood cultures with gram-positive cocci noted, now noted to be MRSA. He is currently on vancomycin for this. TTE negative for endocarditis.  He will require further work-up with TEE that is pending with Cardiology today.  Assessment & Plan:   Active Problems:   Multifocal pneumonia   Severe sepsis (HCC)   Septic embolism (HCC)   Acute hyponatremia   Severe sepsis present admission secondary to multifocal pneumonia, now with MRSA bacteremia -High suspicion of endocarditis given IVDA -TTE with LVEF 55-60% and no findings of endocarditis noted -Cardiology consultation for evaluation for TEE planned today -Repeat blood cultures so far with no growth x 12 hours -Continue on vancomycin, cefepime dcd 5/9 -Lactic acid has down trended, no further need for repeat -Leukocytosis downtrending -Recheck a.m. labs along with procalcitonin  Acute hyponatremia-likely hypovolemic -Currently stable -Recheck a.m. labs -Further IVF with NS today  Mild hypokalemia -Replete and re-evaluate in am  Hyperglycemia-resolved -Hemoglobin A1c 5.3%  History of IV drug use -Urine drug screen positive for methamphetamines even though patient claims he stopped 2 months prior -Patient will likely need to stay inpatient for full completion of antibiotics  DVT prophylaxis:Lovenox Code Status: Full Family Communication: None at bedside; pt will call his gf Disposition Plan:  Status is: Inpatient  Remains inpatient  appropriate because:Ongoing diagnostic testing needed not appropriate for outpatient work up, IV treatments appropriate due to intensity of illness or inability to take PO and Inpatient level of care appropriate due to severity of illness   Dispo: The patient is from: Home  Anticipated d/c is to: LTAC?  Patient currently is not medically stable to d/c.              Difficult to place patient No  Consultants:   Cardiology  ID  Procedures:   See below  Antimicrobials:  Anti-infectives (From admission, onward)   Start     Dose/Rate Route Frequency Ordered Stop   12/29/20 0400  ceFEPIme (MAXIPIME) 2 g in sodium chloride 0.9 % 100 mL IVPB  Status:  Discontinued        2 g 200 mL/hr over 30 Minutes Intravenous Every 8 hours 12/28/20 1802 12/29/20 1826   12/29/20 0100  vancomycin (VANCOCIN) IVPB 1000 mg/200 mL premix        1,000 mg 200 mL/hr over 60 Minutes Intravenous Every 8 hours 12/28/20 1710     12/28/20 1800  vancomycin (VANCOCIN) IVPB 1000 mg/200 mL premix  Status:  Discontinued        1,000 mg 200 mL/hr over 60 Minutes Intravenous  Once 12/28/20 1750 12/28/20 1755   12/28/20 1800  ceFEPIme (MAXIPIME) 2 g in sodium chloride 0.9 % 100 mL IVPB        2 g 200 mL/hr over 30 Minutes Intravenous  Once 12/28/20 1750 12/28/20 1851   12/28/20 1715  vancomycin (VANCOREADY) IVPB 1500 mg/300 mL        1,500 mg 150 mL/hr over 120 Minutes Intravenous  Once 12/28/20 1707 12/28/20 1917  Subjective: Patient seen and evaluated today with no new acute complaints or concerns. No acute concerns or events noted overnight. He states that his chest pain is better today. He has been npo overnight in anticipation of TEE today.  Objective: Vitals:   12/29/20 1404 12/29/20 2017 12/30/20 0556 12/30/20 1322  BP: 124/73 121/76 115/63 127/75  Pulse: 98 88 86 92  Resp: 18 16 20  (!) 31  Temp: 98.4 F (36.9 C)  (!) 97.3 F (36.3 C) 99 F (37.2 C)  TempSrc: Oral    Oral  SpO2: 99% 100% 96% 97%  Weight:      Height:        Intake/Output Summary (Last 24 hours) at 12/30/2020 1327 Last data filed at 12/29/2020 2011 Gross per 24 hour  Intake 720 ml  Output --  Net 720 ml   Filed Weights   12/28/20 1702  Weight: 81.6 kg    Examination:  General exam: Appears calm and comfortable  Respiratory system: Clear to auscultation. Respiratory effort normal. Cardiovascular system: S1 & S2 heard, RRR.  Gastrointestinal system: Abdomen is soft Central nervous system: Alert and awake Extremities: No edema Skin: No significant lesions noted, tattoes throughout Psychiatry: Flat affect.    Data Reviewed: I have personally reviewed following labs and imaging studies  CBC: Recent Labs  Lab 12/28/20 1325 12/29/20 0410 12/30/20 0451  WBC 17.0* 12.6* 11.6*  NEUTROABS 14.8*  --   --   HGB 15.9 14.4 13.4  HCT 46.6 44.2 39.8  MCV 87.1 89.8 88.2  PLT 161 140* 160   Basic Metabolic Panel: Recent Labs  Lab 12/28/20 1325 12/28/20 1601 12/29/20 0410 12/30/20 0451  NA 130*  --  131* 132*  K 3.6  --  3.5 3.3*  CL 96*  --  97* 99  CO2 24  --  26 25  GLUCOSE 160*  --  107* 115*  BUN 10  --  8 9  CREATININE 0.68  --  0.62 0.57*  CALCIUM 8.6*  --  8.1* 7.8*  MG  --  1.8  --  1.8   GFR: Estimated Creatinine Clearance: 157.3 mL/min (A) (by C-G formula based on SCr of 0.57 mg/dL (L)). Liver Function Tests: Recent Labs  Lab 12/28/20 1325  AST 33  ALT 44  ALKPHOS 76  BILITOT 0.7  PROT 7.2  ALBUMIN 3.5   No results for input(s): LIPASE, AMYLASE in the last 168 hours. No results for input(s): AMMONIA in the last 168 hours. Coagulation Profile: Recent Labs  Lab 12/29/20 0410  INR 1.1   Cardiac Enzymes: No results for input(s): CKTOTAL, CKMB, CKMBINDEX, TROPONINI in the last 168 hours. BNP (last 3 results) No results for input(s): PROBNP in the last 8760 hours. HbA1C: Recent Labs    12/28/20 1325  HGBA1C 5.3   CBG: No results for  input(s): GLUCAP in the last 168 hours. Lipid Profile: No results for input(s): CHOL, HDL, LDLCALC, TRIG, CHOLHDL, LDLDIRECT in the last 72 hours. Thyroid Function Tests: No results for input(s): TSH, T4TOTAL, FREET4, T3FREE, THYROIDAB in the last 72 hours. Anemia Panel: No results for input(s): VITAMINB12, FOLATE, FERRITIN, TIBC, IRON, RETICCTPCT in the last 72 hours. Sepsis Labs: Recent Labs  Lab 12/28/20 1720 12/28/20 1841 12/28/20 2204 12/29/20 0410 12/30/20 0451  PROCALCITON  --   --   --  3.55 2.75  LATICACIDVEN 2.5* 3.5* 2.2* 1.5  --     Recent Results (from the past 240 hour(s))  Resp Panel by RT-PCR (Flu  A&B, Covid) Nasopharyngeal Swab     Status: None   Collection Time: 12/28/20  2:29 PM   Specimen: Nasopharyngeal Swab; Nasopharyngeal(NP) swabs in vial transport medium  Result Value Ref Range Status   SARS Coronavirus 2 by RT PCR NEGATIVE NEGATIVE Final    Comment: (NOTE) SARS-CoV-2 target nucleic acids are NOT DETECTED.  The SARS-CoV-2 RNA is generally detectable in upper respiratory specimens during the acute phase of infection. The lowest concentration of SARS-CoV-2 viral copies this assay can detect is 138 copies/mL. A negative result does not preclude SARS-Cov-2 infection and should not be used as the sole basis for treatment or other patient management decisions. A negative result may occur with  improper specimen collection/handling, submission of specimen other than nasopharyngeal swab, presence of viral mutation(s) within the areas targeted by this assay, and inadequate number of viral copies(<138 copies/mL). A negative result must be combined with clinical observations, patient history, and epidemiological information. The expected result is Negative.  Fact Sheet for Patients:  BloggerCourse.com  Fact Sheet for Healthcare Providers:  SeriousBroker.it  This test is no t yet approved or cleared by the  [place] FDA and  has been authorized for detection and/or diagnosis of SARS-CoV-2 by FDA under an Emergency Use Authorization (EUA). This EUA will remain  in effect (meaning this test can be used) for the duration of the COVID-19 declaration under Section 564(b)(1) of the Act, 21 U.S.C.section 360bbb-3(b)(1), unless the authorization is terminated  or revoked sooner.       Influenza A by PCR NEGATIVE NEGATIVE Final   Influenza B by PCR NEGATIVE NEGATIVE Final    Comment: (NOTE) The Xpert Xpress SARS-CoV-2/FLU/RSV plus assay is intended as an aid in the diagnosis of influenza from Nasopharyngeal swab specimens and should not be used as a sole basis for treatment. Nasal washings and aspirates are unacceptable for Xpert Xpress SARS-CoV-2/FLU/RSV testing.  Fact Sheet for Patients: BloggerCourse.com  Fact Sheet for Healthcare Providers: SeriousBroker.it  This test is not yet approved or cleared by the [place] FDA and has been authorized for detection and/or diagnosis of SARS-CoV-2 by FDA under an Emergency Use Authorization (EUA). This EUA will remain in effect (meaning this test can be used) for the duration of the COVID-19 declaration under Section 564(b)(1) of the Act, 21 U.S.C. section 360bbb-3(b)(1), unless the authorization is terminated or revoked.  Performed at [facility], [address], [place], [place] [address]   Blood culture (routine x 2)     Status: Abnormal (Preliminary result)   Collection Time: 12/28/20  5:21 PM   Specimen: BLOOD RIGHT HAND  Result Value Ref Range Status   Specimen Description   Final    BLOOD RIGHT HAND Performed at [facility], [address], [place], [place] [address]    Special Requests   Final    BOTTLES DRAWN AEROBIC AND ANAEROBIC Blood Culture adequate volume Performed at [facility], [address], [place], [place] [address]    Culture  Setup Time   Final    GRAM  POSITIVE COCCI IN BOTH AEROBIC AND ANAEROBIC BOTTLES Gram Stain Report Called to,Read Back By and Verified With: HOWARDTON,M@[id number]  BY MATTHEWS, B 5.9.22 CRITICAL RESULT CALLED TO, READ BACK BY AND VERIFIED WITH: RN Denman George AT 1813 12/29/2020 BY L BENFIELD    Culture (A)  Final    STAPHYLOCOCCUS AUREUS SUSCEPTIBILITIES TO FOLLOW Performed at [facility], [address], [place], [place] [address]    Report Status PENDING  Incomplete  Blood culture (routine x 2)     Status: Abnormal (Preliminary result)   Collection Time: 12/28/20  5:21 PM   Specimen: BLOOD LEFT HAND  Result Value Ref Range Status   Specimen Description   Final    BLOOD LEFT HAND Performed at [facility], [address], [place], [place] [address]    Special Requests   Final    BOTTLES DRAWN AEROBIC AND ANAEROBIC Blood Culture adequate volume Performed at [facility], [address], [place], [place] [address]    Culture  Setup Time   Final    GRAM POSITIVE COCCI ANAEROBIC AND AEROBIC BOTTLES Gram Stain Report Called to,Read Back By and Verified With: HOWARDTON,M@0727  BY MATTHEWS, B 5.9.22 Performed at [facility], [address] [place], [place] [address]    Culture STAPHYLOCOCCUS AUREUS (A)  Final   Report Status PENDING  Incomplete  Blood Culture ID Panel (Reflexed)     Status: Abnormal   Collection Time: 12/28/20  5:21 PM  Result Value Ref Range Status   Enterococcus faecalis NOT DETECTED NOT DETECTED Final   Enterococcus Faecium NOT DETECTED NOT DETECTED Final   Listeria monocytogenes NOT DETECTED NOT DETECTED Final   Staphylococcus species DETECTED (A) NOT DETECTED Final    Comment: CRITICAL RESULT CALLED TO, READ BACK BY AND VERIFIED WITH: RN M HOWERTON AT 1813 12/29/2020 BY L BENFIELD    Staphylococcus aureus (BCID) DETECTED (A) NOT DETECTED Final    Comment: Methicillin (oxacillin)-resistant Staphylococcus aureus (MRSA). MRSA is predictably resistant to beta-lactam antibiotics (except  ceftaroline). Preferred therapy is vancomycin unless clinically contraindicated. Patient requires contact precautions if  hospitalized. CRITICAL RESULT CALLED TO, READ BACK BY AND VERIFIED WITH: RN Denman George AT 1813 12/29/2020 BY L BENFIELD    Staphylococcus epidermidis NOT DETECTED NOT DETECTED Final   Staphylococcus lugdunensis NOT DETECTED NOT DETECTED Final   Streptococcus species NOT DETECTED NOT DETECTED Final   Streptococcus agalactiae NOT DETECTED NOT DETECTED Final   Streptococcus pneumoniae NOT DETECTED NOT DETECTED Final   Streptococcus pyogenes NOT DETECTED NOT DETECTED Final   A.calcoaceticus-baumannii NOT DETECTED NOT DETECTED Final   Bacteroides fragilis NOT DETECTED NOT DETECTED Final   Enterobacterales NOT DETECTED NOT DETECTED Final   Enterobacter cloacae complex NOT DETECTED NOT DETECTED Final   Escherichia coli NOT DETECTED NOT DETECTED Final   Klebsiella aerogenes NOT DETECTED NOT DETECTED Final   Klebsiella oxytoca NOT DETECTED NOT DETECTED Final   Klebsiella pneumoniae NOT DETECTED NOT DETECTED Final   Proteus species NOT DETECTED NOT DETECTED Final   Salmonella species NOT DETECTED NOT DETECTED Final   Serratia marcescens NOT DETECTED NOT DETECTED Final   Haemophilus influenzae NOT DETECTED NOT DETECTED Final   Neisseria meningitidis NOT DETECTED NOT DETECTED Final   Pseudomonas aeruginosa NOT DETECTED NOT DETECTED Final   Stenotrophomonas maltophilia NOT DETECTED NOT DETECTED Final   Candida albicans NOT DETECTED NOT DETECTED Final   Candida auris NOT DETECTED NOT DETECTED Final   Candida glabrata NOT DETECTED NOT DETECTED Final   Candida krusei NOT DETECTED NOT DETECTED Final   Candida parapsilosis NOT DETECTED NOT DETECTED Final   Candida tropicalis NOT DETECTED NOT DETECTED Final   Cryptococcus neoformans/gattii NOT DETECTED NOT DETECTED Final   Meth resistant mecA/C and MREJ DETECTED (A) NOT DETECTED Final    Comment: CRITICAL RESULT CALLED TO, READ BACK  BY AND VERIFIED WITH: RN Denman George AT 1813 12/29/2020 BY L BENFIELD Performed at [facility], [address], [place], [place] [address]  Respiratory (~20 pathogens) panel by PCR     Status: None   Collection Time: 12/29/20  7:30 AM   Specimen: Nasopharyngeal Swab; Respiratory  Result Value Ref Range Status   Adenovirus NOT DETECTED NOT DETECTED Final   Coronavirus 229E NOT DETECTED NOT DETECTED Final    Comment: (NOTE) The Coronavirus on the Respiratory Panel, DOES NOT test for the novel  Coronavirus (2019 nCoV)    Coronavirus HKU1 NOT DETECTED NOT DETECTED Final   Coronavirus NL63 NOT DETECTED NOT DETECTED Final   Coronavirus OC43 NOT DETECTED NOT DETECTED Final   Metapneumovirus NOT DETECTED NOT DETECTED Final   Rhinovirus / Enterovirus NOT DETECTED NOT DETECTED Final   Influenza A NOT DETECTED NOT DETECTED Final   Influenza B NOT DETECTED NOT DETECTED Final   Parainfluenza Virus 1 NOT DETECTED NOT DETECTED Final   Parainfluenza Virus 2 NOT DETECTED NOT DETECTED Final   Parainfluenza Virus 3 NOT DETECTED NOT DETECTED Final   Parainfluenza Virus 4 NOT DETECTED NOT DETECTED Final   Respiratory Syncytial Virus NOT DETECTED NOT DETECTED Final   Bordetella pertussis NOT DETECTED NOT DETECTED Final   Bordetella Parapertussis NOT DETECTED NOT DETECTED Final   Chlamydophila pneumoniae NOT DETECTED NOT DETECTED Final   Mycoplasma pneumoniae NOT DETECTED NOT DETECTED Final    Comment: Performed at [facility], [address] [place], [place] [address]  MRSA PCR Screening     Status: Abnormal   Collection Time: 12/29/20  7:32 AM   Specimen: Nasal Mucosa; Nasopharyngeal  Result Value Ref Range Status   MRSA by PCR POSITIVE (A) NEGATIVE Final    Comment:        The GeneXpert MRSA Assay (FDA approved for NASAL specimens only), is one component of a comprehensive MRSA colonization surveillance program. It is not intended to diagnose MRSA infection nor to guide  or monitor treatment for MRSA infections. RESULT CALLED TO, READ BACK BY AND VERIFIED WITH: HOWERTON M. AT 1018AM ON [id number] BY THOMPSON S. Performed at [facility], [address] [place], [place] [address]   Culture, blood (Routine X 2) w Reflex to ID Panel     Status: None (Preliminary result)   Collection Time: 12/29/20  8:35 PM   Specimen: BLOOD RIGHT HAND  Result Value Ref Range Status   Specimen Description BLOOD RIGHT HAND  Final   Special Requests   Final    BOTTLES DRAWN AEROBIC AND ANAEROBIC Blood Culture results may not be optimal due to an inadequate volume of blood received in culture bottles   Culture   Final    NO GROWTH < 12 HOURS Performed at [facility], [address] [place], [place] [address]    Report Status PENDING  Incomplete  Culture, blood (Routine X 2) w Reflex to ID Panel     Status: None (Preliminary result)   Collection Time: 12/29/20  8:36 PM   Specimen: BLOOD  Result Value Ref Range Status   Specimen Description BLOOD RIGHT ANTECUBITAL  Final   Special Requests   Final    BOTTLES DRAWN AEROBIC AND ANAEROBIC Blood Culture results may not be optimal due to an inadequate volume of blood received in culture bottles   Culture   Final    NO GROWTH < 12 HOURS Performed at [facility], [address] [place], [place] [address]    Report Status PENDING  Incomplete         Radiology Studies: CT Angio Chest PE W and/or Wo Contrast  Result Date: 12/28/2020 CLINICAL DATA:  Chest pain beginning yesterday. EXAM: CT ANGIOGRAPHY CHEST WITH CONTRAST TECHNIQUE: Multidetector CT imaging of the chest was performed using the standard protocol during bolus administration of intravenous contrast. Multiplanar CT image reconstructions and MIPs were obtained to evaluate the vascular anatomy. CONTRAST:  OMNIPAQUE IOHEXOL 350 MG/ML SOLN COMPARISON:  Current chest radiograph. FINDINGS: Cardiovascular: There is satisfactory opacification of the pulmonary arteries to  the proximal segmental level. The smaller segmental vessels less well opacified and are also affected by some respiratory motion. Allowing for this minor limitation, there is no evidence of a pulmonary embolism. Heart is normal in size and configuration. No pericardial effusion. Normal great vessels. Mediastinum/Nodes: Visualized thyroid is unremarkable. No neck base, axillary, mediastinal or hilar masses or enlarged lymph nodes. Trachea and esophagus are unremarkable. Lungs/Pleura: There are bilateral rounded areas of pleural based opacity with several additional small nodular opacities. Most prominent areas are in the right upper lobe, abutting the oblique and lateral pleural margins of the right lower lobe and abutting the oblique fissure the left lower lobe extending to the lung base. Differential diagnosis includes septic emboli and multifocal infection/inflammation. Mild paraseptal noted at the lung apices. No other abnormalities. No pleural effusion or pneumothorax. Upper Abdomen: Unremarkable. Musculoskeletal: No chest wall abnormality. No acute or significant osseous findings. Review of the MIP images confirms the above findings. IMPRESSION: 1. No evidence of a pulmonary embolism. 2. Bilateral pleural based focal areas of lung consolidation in addition to multiple small nodules. Differential diagnosis includes septic emboli and multifocal infection/inflammation. Electronically Signed   By: Amie Portland M.D.   On: 12/28/2020 16:00   DG Chest Port 1 View  Result Date: 12/28/2020 CLINICAL DATA:  Chest pain EXAM: PORTABLE CHEST 1 VIEW COMPARISON:  Chest radiograph dated 03/11/2015. FINDINGS: The heart size and mediastinal contours are within normal limits. Both lungs are clear. The visualized skeletal structures are unremarkable. IMPRESSION: No active disease. Electronically Signed   By: Romona Curls M.D.   On: 12/28/2020 13:57   ECHOCARDIOGRAM COMPLETE  Result Date: 12/29/2020    ECHOCARDIOGRAM REPORT    Patient Name:   George Lucas [facility] Date of Exam: 12/29/2020 Medical Rec #:  [id number]         Height:       74.0 in Accession #:    [id number]        Weight:       180.0 lb Date of Birth:  [birth year]          BSA:          2.078 m Patient Age:    30 years          BP:           129/83 mmHg Patient Gender: M                 HR:           87 bpm. [facility] Procedure: 2D Echo Indications:    Endocarditis I38  History:        Patient has no prior history of Echocardiogram examinations.                 Signs/Symptoms:Chest Pain and Fever; Risk Factors:Current                 Smoker. IVDU.  Sonographer:    Jeryl Columbia RDCS (AE) Referring Phys: [id number] Lamont Dowdy [facility]  Sonographer Comments: Patient complaining of pain throughout  test. Moving and sitting up throughout test. IMPRESSIONS  1. Left ventricular ejection fraction, by estimation, is 55 to 60%. The left ventricle has normal function. Left ventricular endocardial border not optimally defined to evaluate regional wall motion. Left ventricular diastolic parameters were normal.  2. Right ventricular systolic function is normal. The right ventricular size is normal. Tricuspid regurgitation signal is inadequate for assessing PA pressure.  3. The mitral valve is abnormal, mildly thickened but without diagnostic vegetation. No evidence of mitral valve regurgitation.  4. The aortic valve was not well visualized. Aortic valve regurgitation is not visualized.  5. Unable to estimate CVP.  6. If there is high suspicion for endocarditis, TEE could be considered given overall limited views. FINDINGS  Left Ventricle: Left ventricular ejection fraction, by estimation, is 55 to 60%. The left ventricle has normal function. Left ventricular endocardial border not optimally defined to evaluate regional wall motion. The left ventricular internal cavity size was normal in size. There is no left ventricular hypertrophy. Left ventricular diastolic parameters were normal.  Right Ventricle: The right ventricular size is normal. No increase in right ventricular wall thickness. Right ventricular systolic function is normal. Tricuspid regurgitation signal is inadequate for assessing PA pressure. Left Atrium: Left atrial size was normal in size. Right Atrium: Right atrial size was normal in size. Pericardium: There is no evidence of pericardial effusion. Mitral Valve: The mitral valve is abnormal. There is mild thickening of the mitral valve leaflet(s). No evidence of mitral valve regurgitation. Tricuspid Valve: The tricuspid valve is not well visualized. Tricuspid valve regurgitation is trivial. Aortic Valve: The aortic valve was not well visualized. Aortic valve regurgitation is not visualized. Pulmonic Valve: The pulmonic valve was grossly normal. Pulmonic valve regurgitation is trivial. Aorta: The aortic root is normal in size and structure. Venous: Unable to estimate CVP. The inferior vena cava was not well visualized. IAS/Shunts: No atrial level shunt detected by color flow Doppler.  LEFT VENTRICLE PLAX 2D LVIDd:         4.06 cm Diastology LVIDs:         2.62 cm LV e' medial:    11.40 cm/s LV PW:         1.07 cm LV E/e' medial:  8.7 LV IVS:        0.74 cm LV e' lateral:   17.70 cm/s                        LV E/e' lateral: 5.6  LEFT ATRIUM           Index LA diam:      2.50 cm 1.20 cm/m LA Vol (A4C): 45.2 ml 21.75 ml/m   AORTA Ao Root diam: 3.10 cm MITRAL VALVE MV Area (PHT): 2.58 cm MV Decel Time: 294 msec MV E velocity: 99.30 cm/s MV A velocity: 82.80 cm/s MV E/A ratio:  1.20 Nona Dell MD Electronically signed by Nona Dell MD Signature Date/Time: 12/29/2020/10:58:01 AM    Final         Scheduled Meds: . buprenorphine-naloxone  1 tablet Sublingual BID  . Chlorhexidine Gluconate Cloth  6 each Topical Q0600  . enoxaparin (LOVENOX) injection  40 mg Subcutaneous Q24H  . mupirocin ointment  1 application Nasal BID   Continuous Infusions: . vancomycin 1,000 mg  (12/30/20 0808)     LOS: 2 days    Time spent: 35 minutes    Harpreet Pompey D Sherryll Burger, DO [facility]  If 7PM-7AM, please contact night-coverage  [email or web address] 12/30/2020, 1:27 PM

## 2020-12-30 NOTE — Anesthesia Preprocedure Evaluation (Addendum)
Anesthesia Evaluation  Patient identified by MRN, date of birth, ID band Patient awake    Reviewed: Allergy & Precautions, NPO status , Patient's Chart, lab work & pertinent test results  Airway Mallampati: II  TM Distance: >3 FB Neck ROM: Full    Dental  (+) Missing, Chipped, Poor Dentition, Dental Advisory Given   Pulmonary pneumonia, Current Smoker and Patient abstained from smoking.,    + rhonchi  + decreased breath sounds      Cardiovascular Exercise Tolerance: Good  Rhythm:Regular Rate:Tachycardia  28-Dec-2020 13:16:30 [facility]-ER ROUTINE RECORD [birth year] (29 yr) Male Caucasian Room:ER14 Loc:499 Technician: [facility] Test ind: Comment 1:: Comment 2:: Comment 3:: Comment 4:: Vent. rate 110 BPM PR interval 121 ms QRS duration 72 ms QT/QTcB 316/428 ms P-R-T axes 83 90 61 Sinus tachycardia Right atrial enlargement Probable right ventricular hypertrophy Confirmed by Vanetta Mulders [phone]) on 12/28/2020 1:36:12 PM   Neuro/Psych Seizures -, Well Controlled,     GI/Hepatic negative GI ROS, (+)     substance abuse  cocaine use, methamphetamine use and IV drug use, Hepatitis -, C  Endo/Other  negative endocrine ROS  Renal/GU negative Renal ROS     Musculoskeletal  (+) narcotic dependent  Abdominal   Peds  Hematology negative hematology ROS (+)   Anesthesia Other Findings   Reproductive/Obstetrics                            Anesthesia Physical Anesthesia Plan  ASA: IV  Anesthesia Plan: General   Post-op Pain Management:    Induction: Intravenous  PONV Risk Score and Plan: Propofol infusion  Airway Management Planned: Nasal Cannula and Natural Airway  Additional Equipment:   Intra-op Plan:   Post-operative Plan:   Informed Consent: I have reviewed the patients History and Physical, chart, labs and discussed the procedure including the risks, benefits and  alternatives for the proposed anesthesia with the patient or authorized representative who has indicated his/her understanding and acceptance.     Dental advisory given  Plan Discussed with: CRNA and Surgeon  Anesthesia Plan Comments: (Room air saturations 96 to 97 in sitting position, severe shortness of breathing even in semi sitting position, discussed with Dr. Diona Browner, procedure cancelled and will be rescheduled after optimizing his pulmonary status. )       Anesthesia Quick Evaluation

## 2020-12-30 NOTE — Progress Notes (Signed)
      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

## 2020-12-31 DIAGNOSIS — J189 Pneumonia, unspecified organism: Secondary | ICD-10-CM | POA: Diagnosis not present

## 2020-12-31 DIAGNOSIS — E871 Hypo-osmolality and hyponatremia: Secondary | ICD-10-CM | POA: Diagnosis not present

## 2020-12-31 DIAGNOSIS — A419 Sepsis, unspecified organism: Secondary | ICD-10-CM | POA: Diagnosis not present

## 2020-12-31 DIAGNOSIS — I76 Septic arterial embolism: Secondary | ICD-10-CM

## 2020-12-31 LAB — CULTURE, BLOOD (ROUTINE X 2)
Special Requests: ADEQUATE
Special Requests: ADEQUATE

## 2020-12-31 LAB — CBC
HCT: 39.3 % (ref 39.0–52.0)
Hemoglobin: 13.2 g/dL (ref 13.0–17.0)
MCH: 29.6 pg (ref 26.0–34.0)
MCHC: 33.6 g/dL (ref 30.0–36.0)
MCV: 88.1 fL (ref 80.0–100.0)
Platelets: 146 10*3/uL — ABNORMAL LOW (ref 150–400)
RBC: 4.46 MIL/uL (ref 4.22–5.81)
RDW: 13.2 % (ref 11.5–15.5)
WBC: 10.2 10*3/uL (ref 4.0–10.5)
nRBC: 0 % (ref 0.0–0.2)

## 2020-12-31 LAB — BASIC METABOLIC PANEL
Anion gap: 6 (ref 5–15)
BUN: 7 mg/dL (ref 6–20)
CO2: 26 mmol/L (ref 22–32)
Calcium: 7.4 mg/dL — ABNORMAL LOW (ref 8.9–10.3)
Chloride: 99 mmol/L (ref 98–111)
Creatinine, Ser: 0.56 mg/dL — ABNORMAL LOW (ref 0.61–1.24)
GFR, Estimated: >60 mL/min (ref >60)
Glucose, Bld: 113 mg/dL — ABNORMAL HIGH (ref 70–99)
Potassium: 3.2 mmol/L — ABNORMAL LOW (ref 3.5–5.1)
Sodium: 131 mmol/L — ABNORMAL LOW (ref 135–145)

## 2020-12-31 LAB — VANCOMYCIN, PEAK: Vancomycin Pk: 29 ug/mL — ABNORMAL LOW (ref 30–40)

## 2020-12-31 LAB — SEDIMENTATION RATE: Sed Rate: 30 mm/hr — ABNORMAL HIGH (ref 0–16)

## 2020-12-31 LAB — PROCALCITONIN: Procalcitonin: 3.53 ng/mL

## 2020-12-31 LAB — C-REACTIVE PROTEIN: CRP: 21.7 mg/dL — ABNORMAL HIGH (ref <1.0)

## 2020-12-31 LAB — VANCOMYCIN, TROUGH: Vancomycin Tr: 9 ug/mL — ABNORMAL LOW (ref 15–20)

## 2020-12-31 LAB — MAGNESIUM: Magnesium: 1.6 mg/dL — ABNORMAL LOW (ref 1.7–2.4)

## 2020-12-31 MED ORDER — VANCOMYCIN HCL 1250 MG/250ML IV SOLN
1250.0000 mg | Freq: Three times a day (TID) | INTRAVENOUS | Status: DC
  Administered 2020-12-31 – 2021-01-01 (×3): 1250 mg via INTRAVENOUS
  Filled 2020-12-31 (×3): qty 250

## 2020-12-31 MED ORDER — GUAIFENESIN ER 600 MG PO TB12
1200.0000 mg | ORAL_TABLET | Freq: Two times a day (BID) | ORAL | Status: DC | PRN
  Administered 2020-12-31 (×2): 1200 mg via ORAL
  Filled 2020-12-31 (×2): qty 2

## 2020-12-31 MED ORDER — MAGNESIUM SULFATE 4 GM/100ML IV SOLN
4.0000 g | Freq: Once | INTRAVENOUS | Status: AC
  Administered 2020-12-31: 4 g via INTRAVENOUS
  Filled 2020-12-31: qty 100

## 2020-12-31 NOTE — Progress Notes (Addendum)
Pharmacy Antibiotic Note  George Lucas is a 30 y.o. 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  Pharmacy has been consulted for vancomycin dosing. Vancomycin  Levels: PK 29, Tr 9. Will adjust dosing. Ke= 0.22 Hr t 1/2 = 3.2hr  Plan: Increase Vancomycin 1250mg  IV every 8 hours. AUC 512 F/U cxs and clinical progress Monitor V/S, labs and levels as indicated  Height: 6\' 2"  (188 cm) Weight: 81.6 kg (180 lb) IBW/kg (Calculated) : 82.2  Temp (24hrs), Avg:98.6 F (37 C), Min:98.1 F (36.7 C), Max:99 F (37.2 C)  Recent Labs  Lab 12/28/20 1325 12/28/20 1720 12/28/20 1841 12/28/20 2204 12/29/20 0410 12/30/20 0451 12/31/20 0252 12/31/20 0815  WBC 17.0*  --   --   --  12.6* 11.6* 10.2  --   CREATININE 0.68  --   --   --  0.62 0.57* 0.56*  --   LATICACIDVEN  --  2.5* 3.5* 2.2* 1.5  --   --   --   VANCOTROUGH  --   --   --   --   --   --   --  9*  VANCOPEAK  --   --   --   --   --   --  29*  --     Estimated Creatinine Clearance: 157.3 mL/min (A) (by C-G formula based on SCr of 0.56 mg/dL (L)).    Allergies  Allergen Reactions  . Sulfa Antibiotics Anaphylaxis and Rash    Antimicrobials this admission: 5/8 vancomycin >>  5/8 cefepime >> 5/9  Microbiology results: 5/8 BCx: MRSA  5/9 MRSA PCR positive 5/9 BCX: MRSA 5/10 BCX: pending  Thank you for allowing pharmacy to be a part of this patient's care.  7/9, BS Pharm D, 7/9 Clinical Pharmacist Pager [phone] 12/31/2020 9:11 AM

## 2020-12-31 NOTE — Progress Notes (Signed)
PROGRESS NOTE   George Lucas  ZOX:[id number] DOB: Mar 29, 1991 DOA: 12/28/2020 PCP: System, Provider Not In   Chief Complaint  Patient presents with  . Chest Pain   Level of care: Telemetry  Brief Admission History:  30 y.o. male with medical history significant of nicotine dependence, IVDU presented to ED with a complaint of chest pain.  Currently patient, he woke up at 10 o'clock in the morning with the chest pain.  He then had fever of 104 at around 2:30 AM.  He also started having some dry cough.  He arrived to the ED at around 2 PM as he did not have a ride.  No other complaint.  He claims that he has been clean and has not used IVDU in about 2 months but continues to smoke.  ED Course: Upon arrival to ED, he was tachycardic and tachypneic and afebrile.  CBC showed white cells of 17.  BMP showed hyponatremia and hyperglycemia.  Chest x-ray unremarkable however CT angiogram of the chest showed multifocal pneumonia and septic emboli.  Patient received vancomycin in the ED as well as 1 L of IV fluid bolus.  Hospitalist service were consulted to admit the patient for further management for possible bacterial endocarditis.  Assessment & Plan:   Active Problems:   Multifocal pneumonia   Severe sepsis (HCC)   Septic embolism (HCC)   Acute hyponatremia   Severe sepsis from MRSA bacteremia and multifocal pneumonia - Continue IV vancomcycin per ID recommendation, continue to attempt for TEE now planned for 5/13.  Repeated BC no growth to date.   Hyponatremia - improved.   Hypokalemia -repleted.  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  Will try to keep in hospital for 2 weeks of IV therapy followed by long acting therapy.    DVT prophylaxis: enoxaparin  Code Status: full  Family Communication:  Disposition: 2 weeks in hospital  Status is: Inpatient  Remains inpatient appropriate because:IV treatments appropriate due to intensity of illness or inability to take PO  and Inpatient level of care appropriate due to severity of illness   Dispo: The patient is from: Home              Anticipated d/c is to: Home              Patient currently is not medically stable to d/c.   Difficult to place patient No  Consultants:   ID   Cardiology   Procedures:   Tentative TEE for 5/13   Antimicrobials:  Vancomycin  5/10>>  Subjective: Pt reported that he is starting to feel better.   Objective: Vitals:   12/30/20 1322 12/30/20 1431 12/30/20 2144 12/31/20 0437  BP: 127/75 130/77 140/72 122/66  Pulse: 92 84 (!) 101 88  Resp: (!) 31 (!) Temp: 99 F (37.2 C) 99 F (37.2 C) 98.1 F (36.7 C) 98.3 F (36.8 C)  TempSrc: Oral Oral    SpO2: 97% 100% 98% 94%  Weight:      Height:        Intake/Output Summary (Last 24 hours) at 12/31/2020 1238 Last data filed at 12/31/2020 0940 Gross per 24 hour  Intake 1021.93 ml  Output --  Net 1021.93 ml   Filed Weights   12/28/20 1702  Weight: 81.6 kg    Examination:  General exam: Appears calm and comfortable  Respiratory system: Clear to auscultation. Respiratory effort normal. Cardiovascular system: normal S1 & S2 heard. No JVD,  murmurs, rubs, gallops or clicks. No pedal edema. Gastrointestinal system: Abdomen is nondistended, soft and nontender. No organomegaly or masses felt. Normal bowel sounds heard. Central nervous system: Alert and oriented. No focal neurological deficits. Extremities: Symmetric 5 x 5 power. Skin: No rashes, lesions or ulcers Psychiatry: Judgement and insight appear normal. Mood & affect appropriate.   Data Reviewed: I have personally reviewed following labs and imaging studies  CBC: Recent Labs  Lab 12/28/20 1325 12/29/20 0410 12/30/20 0451 12/31/20 0252  WBC 17.0* 12.6* 11.6* 10.2  NEUTROABS 14.8*  --   --   --   HGB 15.9 14.4 13.4 13.2  HCT 46.6 44.2 39.8 39.3  MCV 87.1 89.8 88.2 88.1  PLT 161 140* 160 146*    Basic Metabolic Panel: Recent Labs  Lab  12/28/20 1325 12/28/20 1601 12/29/20 0410 12/30/20 0451 12/31/20 0252  NA 130*  --  131* 132* 131*  K 3.6  --  3.5 3.3* 3.2*  CL 96*  --  97* 99 99  CO2 24  --  26 25 26   GLUCOSE 160*  --  107* 115* 113*  BUN 10  --  8 9 7   CREATININE 0.68  --  0.62 0.57* 0.56*  CALCIUM 8.6*  --  8.1* 7.8* 7.4*  MG  --  1.8  --  1.8 1.6*    GFR: Estimated Creatinine Clearance: 157.3 mL/min (A) (by C-G formula based on SCr of 0.56 mg/dL (L)).  Liver Function Tests: Recent Labs  Lab 12/28/20 1325  AST 33  ALT 44  ALKPHOS 76  BILITOT 0.7  PROT 7.2  ALBUMIN 3.5    CBG: No results for input(s): GLUCAP in the last 168 hours.  Recent Results (from the past 240 hour(s))  Resp Panel by RT-PCR (Flu A&B, Covid) Nasopharyngeal Swab     Status: None   Collection Time: 12/28/20  2:29 PM   Specimen: Nasopharyngeal Swab; Nasopharyngeal(NP) swabs in vial transport medium  Result Value Ref Range Status   SARS Coronavirus 2 by RT PCR NEGATIVE NEGATIVE Final    Comment: (NOTE) SARS-CoV-2 target nucleic acids are NOT DETECTED.  The SARS-CoV-2 RNA is generally detectable in upper respiratory specimens during the acute phase of infection. The lowest concentration of SARS-CoV-2 viral copies this assay can detect is 138 copies/mL. A negative result does not preclude SARS-Cov-2 infection and should not be used as the sole basis for treatment or other patient management decisions. A negative result may occur with  improper specimen collection/handling, submission of specimen other than nasopharyngeal swab, presence of viral mutation(s) within the areas targeted by this assay, and inadequate number of viral copies(<138 copies/mL). A negative result must be combined with clinical observations, patient history, and epidemiological information. The expected result is Negative.  Fact Sheet for Patients:  02/27/21  Fact Sheet for Healthcare Providers:   02/27/21  This test is no t yet approved or cleared by the [employer] FDA and  has been authorized for detection and/or diagnosis of SARS-CoV-2 by FDA under an Emergency Use Authorization (EUA). This EUA will remain  in effect (meaning this test can be used) for the duration of the COVID-19 declaration under Section 564(b)(1) of the Act, 21 U.S.C.section 360bbb-3(b)(1), unless the authorization is terminated  or revoked sooner.       Influenza A by PCR NEGATIVE NEGATIVE Final   Influenza B by PCR NEGATIVE NEGATIVE Final    Comment: (NOTE) The Xpert Xpress SARS-CoV-2/FLU/RSV plus assay is intended as an aid in  the diagnosis of influenza from Nasopharyngeal swab specimens and should not be used as a sole basis for treatment. Nasal washings and aspirates are unacceptable for Xpert Xpress SARS-CoV-2/FLU/RSV testing.  Fact Sheet for Patients: [employer]  Fact Sheet for Healthcare Providers: [employer]it  This test is not yet approved or cleared by the [place] FDA and has been authorized for detection and/or diagnosis of SARS-CoV-2 by FDA under an Emergency Use Authorization (EUA). This EUA will remain in effect (meaning this test can be used) for the duration of the COVID-19 declaration under Section 564(b)(1) of the Act, 21 U.S.C. section 360bbb-3(b)(1), unless the authorization is terminated or revoked.  Performed at [facility], [address] [place], [place] [address]   Blood culture (routine x 2)     Status: Abnormal   Collection Time: 12/28/20  5:21 PM   Specimen: BLOOD RIGHT HAND  Result Value Ref Range Status   Specimen Description   Final    BLOOD RIGHT HAND Performed at [facility], [address] [place], [place] [address]    Special Requests   Final    BOTTLES DRAWN AEROBIC AND ANAEROBIC Blood Culture adequate volume Performed at [facility], [address] [place], [place] [address]    Culture  Setup Time   Final    GRAM POSITIVE COCCI IN BOTH AEROBIC AND ANAEROBIC BOTTLES Gram Stain Report Called to,Read Back By and Verified With: HOWARDTON,M@0727  BY MATTHEWS, B 5.9.22 CRITICAL RESULT CALLED TO, READ BACK BY AND VERIFIED WITH: RN Denman George AT 1813 12/29/2020 BY L BENFIELD Performed at [facility], [address] [place], [place] [address]    Culture METHICILLIN RESISTANT STAPHYLOCOCCUS AUREUS (A)  Final   Report Status 12/31/2020 FINAL  Final   Organism ID, Bacteria METHICILLIN RESISTANT STAPHYLOCOCCUS AUREUS  Final      Susceptibility   Methicillin resistant staphylococcus aureus - MIC*    CIPROFLOXACIN <=0.5 SENSITIVE Sensitive     ERYTHROMYCIN >=8 RESISTANT Resistant     GENTAMICIN <=0.5 SENSITIVE Sensitive     OXACILLIN >=4 RESISTANT Resistant     TETRACYCLINE <=1 SENSITIVE Sensitive     VANCOMYCIN <=0.5 SENSITIVE Sensitive     TRIMETH/SULFA <=10 SENSITIVE Sensitive     CLINDAMYCIN <=0.25 SENSITIVE Sensitive     RIFAMPIN <=0.5 SENSITIVE Sensitive     Inducible Clindamycin NEGATIVE Sensitive     * METHICILLIN RESISTANT STAPHYLOCOCCUS AUREUS  Blood culture (routine x 2)     Status: Abnormal   Collection Time: 12/28/20  5:21 PM   Specimen: BLOOD LEFT HAND  Result Value Ref Range Status   Specimen Description   Final    BLOOD LEFT HAND Performed at [facility], [address] [place], [place] [address]    Special Requests   Final    BOTTLES DRAWN AEROBIC AND ANAEROBIC Blood Culture adequate volume Performed at [facility], [address], [place], [place] [address]    Culture  Setup Time   Final    GRAM POSITIVE COCCI ANAEROBIC AND AEROBIC BOTTLES Gram Stain Report Called to,Read Back By and Verified With: HOWARDTON,M@0727  BY MATTHEWS, B 5.9.22 Performed at [facility], [address] [place], [place] [address]    Culture (A)  Final    STAPHYLOCOCCUS AUREUS SUSCEPTIBILITIES PERFORMED ON PREVIOUS CULTURE  WITHIN THE LAST 5 DAYS. Performed at [facility], [address] [place], [place] [address]    Report Status 12/31/2020 FINAL  Final  Blood Culture ID Panel (Reflexed)  Status: Abnormal   Collection Time: 12/28/20  5:21 PM  Result Value Ref Range Status   Enterococcus faecalis NOT DETECTED NOT DETECTED Final   Enterococcus Faecium NOT DETECTED NOT DETECTED Final   Listeria monocytogenes NOT DETECTED NOT DETECTED Final   Staphylococcus species DETECTED (A) NOT DETECTED Final    Comment: CRITICAL RESULT CALLED TO, READ BACK BY AND VERIFIED WITH: RN M HOWERTON AT 1813 12/29/2020 BY L BENFIELD    Staphylococcus aureus (BCID) DETECTED (A) NOT DETECTED Final    Comment: Methicillin (oxacillin)-resistant Staphylococcus aureus (MRSA). MRSA is predictably resistant to beta-lactam antibiotics (except ceftaroline). Preferred therapy is vancomycin unless clinically contraindicated. Patient requires contact precautions if  hospitalized. CRITICAL RESULT CALLED TO, READ BACK BY AND VERIFIED WITH: RN Denman GeorgeM HOWERTON AT 1813 12/29/2020 BY L BENFIELD    Staphylococcus epidermidis NOT DETECTED NOT DETECTED Final   Staphylococcus lugdunensis NOT DETECTED NOT DETECTED Final   Streptococcus species NOT DETECTED NOT DETECTED Final   Streptococcus agalactiae NOT DETECTED NOT DETECTED Final   Streptococcus pneumoniae NOT DETECTED NOT DETECTED Final   Streptococcus pyogenes NOT DETECTED NOT DETECTED Final   A.calcoaceticus-baumannii NOT DETECTED NOT DETECTED Final   Bacteroides fragilis NOT DETECTED NOT DETECTED Final   Enterobacterales NOT DETECTED NOT DETECTED Final   Enterobacter cloacae complex NOT DETECTED NOT DETECTED Final   Escherichia coli NOT DETECTED NOT DETECTED Final   Klebsiella aerogenes NOT DETECTED NOT DETECTED Final   Klebsiella oxytoca NOT DETECTED NOT DETECTED Final   Klebsiella pneumoniae NOT DETECTED NOT DETECTED Final   Proteus species NOT DETECTED NOT DETECTED Final   Salmonella  species NOT DETECTED NOT DETECTED Final   Serratia marcescens NOT DETECTED NOT DETECTED Final   Haemophilus influenzae NOT DETECTED NOT DETECTED Final   Neisseria meningitidis NOT DETECTED NOT DETECTED Final   Pseudomonas aeruginosa NOT DETECTED NOT DETECTED Final   Stenotrophomonas maltophilia NOT DETECTED NOT DETECTED Final   Candida albicans NOT DETECTED NOT DETECTED Final   Candida auris NOT DETECTED NOT DETECTED Final   Candida glabrata NOT DETECTED NOT DETECTED Final   Candida krusei NOT DETECTED NOT DETECTED Final   Candida parapsilosis NOT DETECTED NOT DETECTED Final   Candida tropicalis NOT DETECTED NOT DETECTED Final   Cryptococcus neoformans/gattii NOT DETECTED NOT DETECTED Final   Meth resistant mecA/C and MREJ DETECTED (A) NOT DETECTED Final    Comment: CRITICAL RESULT CALLED TO, READ BACK BY AND VERIFIED WITH: RN Denman GeorgeM HOWERTON AT 1813 12/29/2020 BY L BENFIELD Performed at [facility], [address] [place], [place] [address]   Respiratory (~20 pathogens) panel by PCR     Status: None   Collection Time: 12/29/20  7:30 AM   Specimen: Nasopharyngeal Swab; Respiratory  Result Value Ref Range Status   Adenovirus NOT DETECTED NOT DETECTED Final   Coronavirus 229E NOT DETECTED NOT DETECTED Final    Comment: (NOTE) The Coronavirus on the Respiratory Panel, DOES NOT test for the novel  Coronavirus (2019 nCoV)    Coronavirus HKU1 NOT DETECTED NOT DETECTED Final   Coronavirus NL63 NOT DETECTED NOT DETECTED Final   Coronavirus OC43 NOT DETECTED NOT DETECTED Final   Metapneumovirus NOT DETECTED NOT DETECTED Final   Rhinovirus / Enterovirus NOT DETECTED NOT DETECTED Final   Influenza A NOT DETECTED NOT DETECTED Final   Influenza B NOT DETECTED NOT DETECTED Final   Parainfluenza Virus 1 NOT DETECTED NOT DETECTED Final   Parainfluenza Virus 2 NOT DETECTED NOT DETECTED Final   Parainfluenza Virus 3 NOT  DETECTED NOT DETECTED Final   Parainfluenza Virus 4 NOT DETECTED NOT  DETECTED Final   Respiratory Syncytial Virus NOT DETECTED NOT DETECTED Final   Bordetella pertussis NOT DETECTED NOT DETECTED Final   Bordetella Parapertussis NOT DETECTED NOT DETECTED Final   Chlamydophila pneumoniae NOT DETECTED NOT DETECTED Final   Mycoplasma pneumoniae NOT DETECTED NOT DETECTED Final    Comment: Performed at [facility], [address], [place], [place] [address]  MRSA PCR Screening     Status: Abnormal   Collection Time: 12/29/20  7:32 AM   Specimen: Nasal Mucosa; Nasopharyngeal  Result Value Ref Range Status   MRSA by PCR POSITIVE (A) NEGATIVE Final    Comment:        The GeneXpert MRSA Assay (FDA approved for NASAL specimens only), is one component of a comprehensive MRSA colonization surveillance program. It is not intended to diagnose MRSA infection nor to guide or monitor treatment for MRSA infections. RESULT CALLED TO, READ BACK BY AND VERIFIED WITH: HOWERTON M. AT 1018AM ON [id number] BY THOMPSON S. Performed at [facility], [address], [place], [place] [address]   Culture, blood (Routine X 2) w Reflex to ID Panel     Status: Abnormal (Preliminary result)   Collection Time: 12/29/20  8:35 PM   Specimen: BLOOD RIGHT HAND  Result Value Ref Range Status   Specimen Description   Final    BLOOD RIGHT HAND Performed at [facility], [address] [place], [place] [address]    Special Requests   Final    BOTTLES DRAWN AEROBIC AND ANAEROBIC Blood Culture results may not be optimal due to an inadequate volume of blood received in culture bottles Performed at [facility], [address] [place], [place] [address]    Culture  Setup Time   Final    ANAEROBIC BOTTLE ONLY GRAM POSITIVE COCCI Gram Stain Report Called to,Read Back By and Verified With: RESULTS PREVIOUSLY CALLED RN MARY ANN HAWLERTON  12/30/20 BY JONES,T [facility] Performed at [facility], [address], [place], [place] [address]    Culture (A)  Final    STAPHYLOCOCCUS  AUREUS SUSCEPTIBILITIES PERFORMED ON PREVIOUS CULTURE WITHIN THE LAST 5 DAYS. Performed at [facility], [address], [place], [place] [address]    Report Status PENDING  Incomplete  Culture, blood (Routine X 2) w Reflex to ID Panel     Status: Abnormal (Preliminary result)   Collection Time: 12/29/20  8:36 PM   Specimen: BLOOD  Result Value Ref Range Status   Specimen Description   Final    BLOOD RIGHT ANTECUBITAL Performed at [facility], [address], [place], [place] [address]    Special Requests   Final    BOTTLES DRAWN AEROBIC AND ANAEROBIC Blood Culture results may not be optimal due to an inadequate volume of blood received in culture bottles Performed at [facility], [address] [place], [place] [address]    Culture  Setup Time   Final    IN BOTH AEROBIC AND ANAEROBIC BOTTLES GRAM POSITIVE COCCI Gram Stain Report Called to,Read Back By and Verified With: MARY ANN HAWLERTON, RN  12/30/20 BY JONES,T [facility] CRITICAL VALUE NOTED.  VALUE IS CONSISTENT WITH PREVIOUSLY REPORTED AND CALLED VALUE.    Culture (A)  Final    STAPHYLOCOCCUS AUREUS SUSCEPTIBILITIES PERFORMED ON PREVIOUS CULTURE WITHIN THE LAST 5 DAYS. Performed at [facility], [address] [place], [place] [address]    Report Status PENDING  Incomplete  Culture, blood (routine x 2)     Status: None (Preliminary result)   Collection Time: 12/30/20  4:20 PM   Specimen: BLOOD RIGHT ARM  Result Value Ref Range Status   Specimen Description BLOOD RIGHT ARM  Final   Special Requests   Final    BOTTLES DRAWN AEROBIC AND ANAEROBIC Blood Culture adequate volume   Culture   Final    NO GROWTH < 12 HOURS Performed at [facility], [address], [place], [place] [address]    Report Status PENDING  Incomplete  Culture, blood (routine x 2)     Status: None (Preliminary result)   Collection Time: 12/30/20  4:20 PM   Specimen: BLOOD LEFT ARM  Result Value Ref Range Status   Specimen Description  BLOOD LEFT ARM  Final   Special Requests   Final    BOTTLES DRAWN AEROBIC AND ANAEROBIC Blood Culture adequate volume   Culture   Final    NO GROWTH < 12 HOURS Performed at [facility], [address], [place], [place] [address]    Report Status PENDING  Incomplete     Radiology Studies: No results found.  Scheduled Meds: . buprenorphine-naloxone  1 tablet Sublingual BID  . Chlorhexidine Gluconate Cloth  6 each Topical Q0600  . enoxaparin (LOVENOX) injection  40 mg Subcutaneous Q24H  . mupirocin ointment  1 application Nasal BID   Continuous Infusions: . magnesium sulfate bolus IVPB 4 g (12/31/20 1042)  . vancomycin       LOS: 3 days   Time spent: 38 mins   Montae Stager Laural Benes, MD How to contact the [facility] Attending or Consulting provider 7A - 7P or covering provider during after hours 7P -7A, for this patient?  1. Check the care team in [facility] and look for a) attending/consulting [facility] provider listed and b) the [facility] team listed 2. Log into [email or web address] and use [facility]'s universal password to access. If you do not have the password, please contact the hospital operator. 3. Locate the [facility] provider you are looking for under [facility] and page to a number that you can be directly reached. 4. If you still have difficulty reaching the provider, please page the [facility] (Director on Call) for the Hospitalists listed on amion for assistance.  12/31/2020, 12:38 PM

## 2020-12-31 NOTE — Progress Notes (Addendum)
      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  He had low quality TTE and could not undergo TEE study is tentatively planned for tomorrow.  Repeat blood cultures taken 12/30/2020 no growth but only to <12 hours  He has by clinical picture right sided endocarditis and should ideally be treated with IV antibiotics in the hospital though I do not know the patient and if he is willing to do so  At minimum I would prefer him to stay to get 2 weeks IV antibiotics before we could consider LA antibiotics vs bio-available pills for him  HCV + ab: Back RNA and genotype would be happy to see him in the clinic and treat his hepatitis C.    [name] 12/31/2020, 6:28 PM

## 2020-12-31 NOTE — Progress Notes (Signed)
    TEE was unable to be performed on 12/30/2020 due to the patient's respiratory status. In talking with the patient this morning, he reports his breathing has improved but he continues to have orthopnea.  Upon reviewing schedules with endoscopy and echocardiogram sonographers, will plan for TEE on 01/02/2021 at 0930 with Dr. Diona Browner. Will need to be NPO at midnight leading up to the procedure.   Signed, Ellsworth Lennox, PA-C 12/31/2020, 12:13 PM Pager: [phone]

## 2021-01-01 DIAGNOSIS — E871 Hypo-osmolality and hyponatremia: Secondary | ICD-10-CM | POA: Diagnosis not present

## 2021-01-01 DIAGNOSIS — I76 Septic arterial embolism: Secondary | ICD-10-CM | POA: Diagnosis not present

## 2021-01-01 DIAGNOSIS — J189 Pneumonia, unspecified organism: Secondary | ICD-10-CM | POA: Diagnosis not present

## 2021-01-01 DIAGNOSIS — A419 Sepsis, unspecified organism: Secondary | ICD-10-CM | POA: Diagnosis not present

## 2021-01-01 LAB — COMPREHENSIVE METABOLIC PANEL
ALT: 41 U/L (ref 0–44)
AST: 39 U/L (ref 15–41)
Albumin: 2.1 g/dL — ABNORMAL LOW (ref 3.5–5.0)
Alkaline Phosphatase: 120 U/L (ref 38–126)
Anion gap: 4 — ABNORMAL LOW (ref 5–15)
BUN: 8 mg/dL (ref 6–20)
CO2: 26 mmol/L (ref 22–32)
Calcium: 7.1 mg/dL — ABNORMAL LOW (ref 8.9–10.3)
Chloride: 101 mmol/L (ref 98–111)
Creatinine, Ser: 0.62 mg/dL (ref 0.61–1.24)
GFR, Estimated: >60 mL/min (ref >60)
Glucose, Bld: 158 mg/dL — ABNORMAL HIGH (ref 70–99)
Potassium: 3 mmol/L — ABNORMAL LOW (ref 3.5–5.1)
Sodium: 131 mmol/L — ABNORMAL LOW (ref 135–145)
Total Bilirubin: 0.7 mg/dL (ref 0.3–1.2)
Total Protein: 5.5 g/dL — ABNORMAL LOW (ref 6.5–8.1)

## 2021-01-01 LAB — CBC WITH DIFFERENTIAL/PLATELET
Abs Immature Granulocytes: 0.11 10*3/uL — ABNORMAL HIGH (ref 0.00–0.07)
Basophils Absolute: 0.1 10*3/uL (ref 0.0–0.1)
Basophils Relative: 1 %
Eosinophils Absolute: 0.1 10*3/uL (ref 0.0–0.5)
Eosinophils Relative: 1 %
HCT: 36.9 % — ABNORMAL LOW (ref 39.0–52.0)
Hemoglobin: 12.6 g/dL — ABNORMAL LOW (ref 13.0–17.0)
Immature Granulocytes: 1 %
Lymphocytes Relative: 17 %
Lymphs Abs: 1.6 10*3/uL (ref 0.7–4.0)
MCH: 29.5 pg (ref 26.0–34.0)
MCHC: 34.1 g/dL (ref 30.0–36.0)
MCV: 86.4 fL (ref 80.0–100.0)
Monocytes Absolute: 1.3 10*3/uL — ABNORMAL HIGH (ref 0.1–1.0)
Monocytes Relative: 13 %
Neutro Abs: 6.7 10*3/uL (ref 1.7–7.7)
Neutrophils Relative %: 67 %
Platelets: 171 10*3/uL (ref 150–400)
RBC: 4.27 MIL/uL (ref 4.22–5.81)
RDW: 13.5 % (ref 11.5–15.5)
WBC: 9.8 10*3/uL (ref 4.0–10.5)
nRBC: 0 % (ref 0.0–0.2)

## 2021-01-01 LAB — CULTURE, BLOOD (ROUTINE X 2)

## 2021-01-01 LAB — VITAMIN D 25 HYDROXY (VIT D DEFICIENCY, FRACTURES): Vit D, 25-Hydroxy: 22.31 ng/mL — ABNORMAL LOW (ref 30–100)

## 2021-01-01 LAB — C-REACTIVE PROTEIN: CRP: 22.8 mg/dL — ABNORMAL HIGH (ref <1.0)

## 2021-01-01 LAB — MAGNESIUM: Magnesium: 2 mg/dL (ref 1.7–2.4)

## 2021-01-01 MED ORDER — POLYETHYLENE GLYCOL 3350 17 G PO PACK
17.0000 g | PACK | Freq: Every day | ORAL | Status: DC
  Administered 2021-01-02 – 2021-01-15 (×7): 17 g via ORAL
  Filled 2021-01-01 (×13): qty 1

## 2021-01-01 MED ORDER — VANCOMYCIN HCL IN DEXTROSE 1-5 GM/200ML-% IV SOLN
1000.0000 mg | Freq: Three times a day (TID) | INTRAVENOUS | Status: DC
  Administered 2021-01-01 – 2021-01-15 (×40): 1000 mg via INTRAVENOUS
  Filled 2021-01-01 (×41): qty 200

## 2021-01-01 MED ORDER — SODIUM CHLORIDE 0.9 % IV BOLUS
500.0000 mL | Freq: Once | INTRAVENOUS | Status: AC
  Administered 2021-01-01: 500 mL via INTRAVENOUS

## 2021-01-01 MED ORDER — POTASSIUM CHLORIDE CRYS ER 20 MEQ PO TBCR
60.0000 meq | EXTENDED_RELEASE_TABLET | Freq: Once | ORAL | Status: AC
  Administered 2021-01-01: 60 meq via ORAL
  Filled 2021-01-01: qty 3

## 2021-01-01 MED ORDER — VITAMIN D 25 MCG (1000 UNIT) PO TABS
2000.0000 [IU] | ORAL_TABLET | Freq: Every day | ORAL | Status: DC
  Administered 2021-01-02 – 2021-01-15 (×14): 2000 [IU] via ORAL
  Filled 2021-01-01 (×14): qty 2

## 2021-01-01 MED ORDER — POTASSIUM CHLORIDE CRYS ER 20 MEQ PO TBCR
40.0000 meq | EXTENDED_RELEASE_TABLET | Freq: Every day | ORAL | Status: AC
  Administered 2021-01-01: 40 meq via ORAL
  Filled 2021-01-01: qty 2

## 2021-01-01 MED ORDER — SENNOSIDES-DOCUSATE SODIUM 8.6-50 MG PO TABS
2.0000 | ORAL_TABLET | Freq: Every day | ORAL | Status: DC
  Administered 2021-01-01 – 2021-01-14 (×11): 2 via ORAL
  Filled 2021-01-01 (×14): qty 2

## 2021-01-01 NOTE — Progress Notes (Signed)
      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  He had low quality TTE and could not undergo TEE study is tentatively planned for tomorrow.  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    [name] 01/01/2021, 7:42 PM

## 2021-01-01 NOTE — Progress Notes (Addendum)
Pharmacy Antibiotic Note  George Lucas is a 30 y.o. 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  Pharmacy has been consulted for vancomycin dosing. Vancomycin  Levels: PK 29, Tr 9.  Ke= 0.22 Hr t 1/2 = 3.2hr AUC 460, therapeutic    Plan: Vancomycin 1000mg  IV every 8 hours F/U cxs and clinical progress Monitor V/S, labs and levels as indicated  Height: 6\' 2"  (188 cm) Weight: 81.6 kg (180 lb) IBW/kg (Calculated) : 82.2  Temp (24hrs), Avg:98.7 F (37.1 C), Min:98.4 F (36.9 C), Max:99.1 F (37.3 C)  Recent Labs  Lab 12/28/20 1325 12/28/20 1720 12/28/20 1841 12/28/20 2204 12/29/20 0410 12/30/20 0451 12/31/20 0252 12/31/20 0815 01/01/21 0612  WBC 17.0*  --   --   --  12.6* 11.6* 10.2  --  9.8  CREATININE 0.68  --   --   --  0.62 0.57* 0.56*  --  0.62  LATICACIDVEN  --  2.5* 3.5* 2.2* 1.5  --   --   --   --   VANCOTROUGH  --   --   --   --   --   --   --  9*  --   VANCOPEAK  --   --   --   --   --   --  29*  --   --     Estimated Creatinine Clearance: 157.3 mL/min (by C-G formula based on SCr of 0.62 mg/dL).    Allergies  Allergen Reactions  . Sulfa Antibiotics Anaphylaxis and Rash    Antimicrobials this admission: 5/8 vancomycin >>  5/8 cefepime >> 5/9  Microbiology results: 5/8 BCx: MRSA  5/9 MRSA PCR positive 5/9 BCX: MRSA 5/10 BCX: pending  Thank you for allowing pharmacy to be a part of this patient's care.  7/9, PharmD, MBA, BCGP Clinical Pharmacist  01/01/2021 7:54 AM

## 2021-01-01 NOTE — Progress Notes (Signed)
PROGRESS NOTE   George Lucas  ZOX:[id number] DOB: July 13, 1991 DOA: 12/28/2020 PCP: System, Provider Not In   Chief Complaint  Patient presents with  . Chest Pain   Level of care: Med-Surg  Brief Admission History:  30 y.o. male with medical history significant of nicotine dependence, IVDU presented to ED with a complaint of chest pain.  Currently patient, he woke up at 10 o'clock in the morning with the chest pain.  He then had fever of 104 at around 2:30 AM.  He also started having some dry cough.  He arrived to the ED at around 2 PM as he did not have a ride.  No other complaint.  He claims that he has been clean and has not used IVDU in about 2 months but continues to smoke.  ED Course: Upon arrival to ED, he was tachycardic and tachypneic and afebrile.  CBC showed white cells of 17.  BMP showed hyponatremia and hyperglycemia.  Chest x-ray unremarkable however CT angiogram of the chest showed multifocal pneumonia and septic emboli.  Patient received vancomycin in the ED as well as 1 L of IV fluid bolus.  Hospitalist service were consulted to admit the patient for further management for possible bacterial endocarditis.  Assessment & Plan:   Active Problems:   Multifocal pneumonia   Severe sepsis (HCC)   Septic embolism (HCC)   Acute hyponatremia   Severe sepsis from MRSA bacteremia and multifocal pneumonia - Continue IV vancomcycin per ID recommendation, continue to attempt for TEE now planned for 5/13.  Repeated BC 12/30/20 persistently positive with G+ cocci.   Will repeat [facility] tomorrow 5/13 to document clearance of bacteremia.  DC cardiac monitor.    Persistent bacteremia - continue IV vancomycin, repeat BC 5/13 to document clearance of bacteremia.    Hyponatremia - improved but not resolved.    Hypokalemia - oral replacement ordered, Mg has been repleted.    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  ID recommending to keep in hospital for 2 weeks of IV therapy  followed by long acting therapy.    DVT prophylaxis: enoxaparin  Code Status: full  Family Communication:  Disposition: 2 weeks in hospital  Status is: Inpatient  Remains inpatient appropriate because:IV treatments appropriate due to intensity of illness or inability to take PO and Inpatient level of care appropriate due to severity of illness   Dispo: The patient is from: Home              Anticipated d/c is to: Home              Patient currently is not medically stable to d/c.   Difficult to place patient No  Consultants:   ID   Cardiology   Procedures:   Tentative TEE for 5/13   Antimicrobials:  Vancomycin  5/10>>  Subjective: Pt without specific complaint today.     Objective: Vitals:   12/31/20 0437 12/31/20 1425 12/31/20 2114 01/01/21 0506  BP: 122/66 124/75 135/79 136/83  Pulse: 88 98 (!) 109 100  Resp: 20 (!) 22 17 18   Temp: 98.3 F (36.8 C) 99.1 F (37.3 C) 98.5 F (36.9 C) 98.4 F (36.9 C)  TempSrc:  Oral    SpO2: 94% 95% 93% 95%  Weight:      Height:        Intake/Output Summary (Last 24 hours) at 01/01/2021 1226 Last data filed at 01/01/2021 0900 Gross per 24 hour  Intake 2620 ml  Output --  Net 2620 ml   Filed Weights   12/28/20 1702  Weight: 81.6 kg    Examination:  General exam: Appears calm and comfortable  Respiratory system: Clear to auscultation. Respiratory effort normal. Cardiovascular system: normal S1 & S2 heard. No JVD, murmurs, rubs, gallops or clicks. No pedal edema. Gastrointestinal system: Abdomen is nondistended, soft and nontender. No organomegaly or masses felt. Normal bowel sounds heard. Central nervous system: Alert and oriented. No focal neurological deficits. Extremities: Symmetric 5 x 5 power. Skin: No rashes, lesions or ulcers Psychiatry: Judgement and insight appear poor. Mood & affect appropriate.   Data Reviewed: I have personally reviewed following labs and imaging studies  CBC: Recent Labs  Lab  12/28/20 1325 12/29/20 0410 12/30/20 0451 12/31/20 0252 01/01/21 0612  WBC 17.0* 12.6* 11.6* 10.2 9.8  NEUTROABS 14.8*  --   --   --  6.7  HGB 15.9 14.4 13.4 13.2 12.6*  HCT 46.6 44.2 39.8 39.3 36.9*  MCV 87.1 89.8 88.2 88.1 86.4  PLT 161 140* 160 146* 171    Basic Metabolic Panel: Recent Labs  Lab 12/28/20 1325 12/28/20 1601 12/29/20 0410 12/30/20 0451 12/31/20 0252 01/01/21 0612  NA 130*  --  131* 132* 131* 131*  K 3.6  --  3.5 3.3* 3.2* 3.0*  CL 96*  --  97* 99 99 101  CO2 24  --  26 25 26 26   GLUCOSE 160*  --  107* 115* 113* 158*  BUN 10  --  8 9 7 8   CREATININE 0.68  --  0.62 0.57* 0.56* 0.62  CALCIUM 8.6*  --  8.1* 7.8* 7.4* 7.1*  MG  --  1.8  --  1.8 1.6* 2.0    GFR: Estimated Creatinine Clearance: 157.3 mL/min (by C-G formula based on SCr of 0.62 mg/dL).  Liver Function Tests: Recent Labs  Lab 12/28/20 1325 01/01/21 0612  AST 33 39  ALT 44 41  ALKPHOS 76 120  BILITOT 0.7 0.7  PROT 7.2 5.5*  ALBUMIN 3.5 2.1*    CBG: No results for input(s): GLUCAP in the last 168 hours.  Recent Results (from the past 240 hour(s))  Resp Panel by RT-PCR (Flu A&B, Covid) Nasopharyngeal Swab     Status: None   Collection Time: 12/28/20  2:29 PM   Specimen: Nasopharyngeal Swab; Nasopharyngeal(NP) swabs in vial transport medium  Result Value Ref Range Status   SARS Coronavirus 2 by RT PCR NEGATIVE NEGATIVE Final    Comment: (NOTE) SARS-CoV-2 target nucleic acids are NOT DETECTED.  The SARS-CoV-2 RNA is generally detectable in upper respiratory specimens during the acute phase of infection. The lowest concentration of SARS-CoV-2 viral copies this assay can detect is 138 copies/mL. A negative result does not preclude SARS-Cov-2 infection and should not be used as the sole basis for treatment or other patient management decisions. A negative result may occur with  improper specimen collection/handling, submission of specimen other than nasopharyngeal swab, presence  of viral mutation(s) within the areas targeted by this assay, and inadequate number of viral copies(<138 copies/mL). A negative result must be combined with clinical observations, patient history, and epidemiological information. The expected result is Negative.  Fact Sheet for Patients:  BloggerCourse.comhttps://www.fda.gov/media/152166/download  Fact Sheet for Healthcare Providers:  SeriousBroker.ithttps://www.fda.gov/media/152162/download  This test is no t yet approved or cleared by the [place] FDA and  has been authorized for detection and/or diagnosis of SARS-CoV-2 by FDA under an Emergency Use Authorization (EUA). This EUA will remain  in effect (meaning  this test can be used) for the duration of the COVID-19 declaration under Section 564(b)(1) of the Act, 21 U.S.C.section 360bbb-3(b)(1), unless the authorization is terminated  or revoked sooner.       Influenza A by PCR NEGATIVE NEGATIVE Final   Influenza B by PCR NEGATIVE NEGATIVE Final    Comment: (NOTE) The Xpert Xpress SARS-CoV-2/FLU/RSV plus assay is intended as an aid in the diagnosis of influenza from Nasopharyngeal swab specimens and should not be used as a sole basis for treatment. Nasal washings and aspirates are unacceptable for Xpert Xpress SARS-CoV-2/FLU/RSV testing.  Fact Sheet for Patients: BloggerCourse.com  Fact Sheet for Healthcare Providers: SeriousBroker.it  This test is not yet approved or cleared by the [place] FDA and has been authorized for detection and/or diagnosis of SARS-CoV-2 by FDA under an Emergency Use Authorization (EUA). This EUA will remain in effect (meaning this test can be used) for the duration of the COVID-19 declaration under Section 564(b)(1) of the Act, 21 U.S.C. section 360bbb-3(b)(1), unless the authorization is terminated or revoked.  Performed at [facility], [address], [place], [place] [address]   Blood culture (routine x 2)      Status: Abnormal   Collection Time: 12/28/20  5:21 PM   Specimen: BLOOD RIGHT HAND  Result Value Ref Range Status   Specimen Description   Final    BLOOD RIGHT HAND Performed at [facility], [address] [place], [place] [address]    Special Requests   Final    BOTTLES DRAWN AEROBIC AND ANAEROBIC Blood Culture adequate volume Performed at [facility], [address] [place], [place] [address]    Culture  Setup Time   Final    GRAM POSITIVE COCCI IN BOTH AEROBIC AND ANAEROBIC BOTTLES Gram Stain Report Called to,Read Back By and Verified With: HOWARDTON,M@0727  BY MATTHEWS, B 5.9.22 CRITICAL RESULT CALLED TO, READ BACK BY AND VERIFIED WITH: RN Denman George AT 1813 12/29/2020 BY L BENFIELD Performed at [facility], [address] [place], [place] [address]    Culture METHICILLIN RESISTANT STAPHYLOCOCCUS AUREUS (A)  Final   Report Status 12/31/2020 FINAL  Final   Organism ID, Bacteria METHICILLIN RESISTANT STAPHYLOCOCCUS AUREUS  Final      Susceptibility   Methicillin resistant staphylococcus aureus - MIC*    CIPROFLOXACIN <=0.5 SENSITIVE Sensitive     ERYTHROMYCIN >=8 RESISTANT Resistant     GENTAMICIN <=0.5 SENSITIVE Sensitive     OXACILLIN >=4 RESISTANT Resistant     TETRACYCLINE <=1 SENSITIVE Sensitive     VANCOMYCIN <=0.5 SENSITIVE Sensitive     TRIMETH/SULFA <=10 SENSITIVE Sensitive     CLINDAMYCIN <=0.25 SENSITIVE Sensitive     RIFAMPIN <=0.5 SENSITIVE Sensitive     Inducible Clindamycin NEGATIVE Sensitive     * METHICILLIN RESISTANT STAPHYLOCOCCUS AUREUS  Blood culture (routine x 2)     Status: Abnormal   Collection Time: 12/28/20  5:21 PM   Specimen: BLOOD LEFT HAND  Result Value Ref Range Status   Specimen Description   Final    BLOOD LEFT HAND Performed at [facility], [address] [place], [place] [address]    Special Requests   Final    BOTTLES DRAWN AEROBIC AND ANAEROBIC Blood Culture adequate volume Performed at [facility], [address],  [place], [place] [address]    Culture  Setup Time   Final    GRAM POSITIVE COCCI ANAEROBIC AND AEROBIC BOTTLES Gram Stain Report Called to,Read Back By and Verified With:  HOWARDTON,M@0727  BY MATTHEWS, B 5.9.22 Performed at [facility], [address], [place], [place] [address]    Culture (A)  Final    STAPHYLOCOCCUS AUREUS SUSCEPTIBILITIES PERFORMED ON PREVIOUS CULTURE WITHIN THE LAST 5 DAYS. Performed at [facility], [address] [place], [place] [address]    Report Status 12/31/2020 FINAL  Final  Blood Culture ID Panel (Reflexed)     Status: Abnormal   Collection Time: 12/28/20  5:21 PM  Result Value Ref Range Status   Enterococcus faecalis NOT DETECTED NOT DETECTED Final   Enterococcus Faecium NOT DETECTED NOT DETECTED Final   Listeria monocytogenes NOT DETECTED NOT DETECTED Final   Staphylococcus species DETECTED (A) NOT DETECTED Final    Comment: CRITICAL RESULT CALLED TO, READ BACK BY AND VERIFIED WITH: RN M HOWERTON AT 1813 12/29/2020 BY L BENFIELD    Staphylococcus aureus (BCID) DETECTED (A) NOT DETECTED Final    Comment: Methicillin (oxacillin)-resistant Staphylococcus aureus (MRSA). MRSA is predictably resistant to beta-lactam antibiotics (except ceftaroline). Preferred therapy is vancomycin unless clinically contraindicated. Patient requires contact precautions if  hospitalized. CRITICAL RESULT CALLED TO, READ BACK BY AND VERIFIED WITH: RN Denman George AT 1813 12/29/2020 BY L BENFIELD    Staphylococcus epidermidis NOT DETECTED NOT DETECTED Final   Staphylococcus lugdunensis NOT DETECTED NOT DETECTED Final   Streptococcus species NOT DETECTED NOT DETECTED Final   Streptococcus agalactiae NOT DETECTED NOT DETECTED Final   Streptococcus pneumoniae NOT DETECTED NOT DETECTED Final   Streptococcus pyogenes NOT DETECTED NOT DETECTED Final   A.calcoaceticus-baumannii NOT DETECTED NOT DETECTED Final   Bacteroides fragilis NOT DETECTED NOT DETECTED Final   Enterobacterales NOT  DETECTED NOT DETECTED Final   Enterobacter cloacae complex NOT DETECTED NOT DETECTED Final   Escherichia coli NOT DETECTED NOT DETECTED Final   Klebsiella aerogenes NOT DETECTED NOT DETECTED Final   Klebsiella oxytoca NOT DETECTED NOT DETECTED Final   Klebsiella pneumoniae NOT DETECTED NOT DETECTED Final   Proteus species NOT DETECTED NOT DETECTED Final   Salmonella species NOT DETECTED NOT DETECTED Final   Serratia marcescens NOT DETECTED NOT DETECTED Final   Haemophilus influenzae NOT DETECTED NOT DETECTED Final   Neisseria meningitidis NOT DETECTED NOT DETECTED Final   Pseudomonas aeruginosa NOT DETECTED NOT DETECTED Final   Stenotrophomonas maltophilia NOT DETECTED NOT DETECTED Final   Candida albicans NOT DETECTED NOT DETECTED Final   Candida auris NOT DETECTED NOT DETECTED Final   Candida glabrata NOT DETECTED NOT DETECTED Final   Candida krusei NOT DETECTED NOT DETECTED Final   Candida parapsilosis NOT DETECTED NOT DETECTED Final   Candida tropicalis NOT DETECTED NOT DETECTED Final   Cryptococcus neoformans/gattii NOT DETECTED NOT DETECTED Final   Meth resistant mecA/C and MREJ DETECTED (A) NOT DETECTED Final    Comment: CRITICAL RESULT CALLED TO, READ BACK BY AND VERIFIED WITH: RN Denman George AT 1813 12/29/2020 BY L BENFIELD Performed at [facility], [address] [place], [place] [address]   Respiratory (~20 pathogens) panel by PCR     Status: None   Collection Time: 12/29/20  7:30 AM   Specimen: Nasopharyngeal Swab; Respiratory  Result Value Ref Range Status   Adenovirus NOT DETECTED NOT DETECTED Final   Coronavirus 229E NOT DETECTED NOT DETECTED Final    Comment: (NOTE) The Coronavirus on the Respiratory Panel, DOES NOT test for the novel  Coronavirus (2019 nCoV)    Coronavirus HKU1 NOT DETECTED NOT DETECTED Final   Coronavirus NL63 NOT DETECTED NOT DETECTED Final  Coronavirus OC43 NOT DETECTED NOT DETECTED Final   Metapneumovirus NOT DETECTED NOT DETECTED  Final   Rhinovirus / Enterovirus NOT DETECTED NOT DETECTED Final   Influenza A NOT DETECTED NOT DETECTED Final   Influenza B NOT DETECTED NOT DETECTED Final   Parainfluenza Virus 1 NOT DETECTED NOT DETECTED Final   Parainfluenza Virus 2 NOT DETECTED NOT DETECTED Final   Parainfluenza Virus 3 NOT DETECTED NOT DETECTED Final   Parainfluenza Virus 4 NOT DETECTED NOT DETECTED Final   Respiratory Syncytial Virus NOT DETECTED NOT DETECTED Final   Bordetella pertussis NOT DETECTED NOT DETECTED Final   Bordetella Parapertussis NOT DETECTED NOT DETECTED Final   Chlamydophila pneumoniae NOT DETECTED NOT DETECTED Final   Mycoplasma pneumoniae NOT DETECTED NOT DETECTED Final    Comment: Performed at [facility], [address] [place], [place] [address]  MRSA PCR Screening     Status: Abnormal   Collection Time: 12/29/20  7:32 AM   Specimen: Nasal Mucosa; Nasopharyngeal  Result Value Ref Range Status   MRSA by PCR POSITIVE (A) NEGATIVE Final    Comment:        The GeneXpert MRSA Assay (FDA approved for NASAL specimens only), is one component of a comprehensive MRSA colonization surveillance program. It is not intended to diagnose MRSA infection nor to guide or monitor treatment for MRSA infections. RESULT CALLED TO, READ BACK BY AND VERIFIED WITH: HOWERTON M. AT 1018AM ON [id number] BY THOMPSON S. Performed at [facility], [address], [place], [place] [address]   Culture, blood (Routine X 2) w Reflex to ID Panel     Status: Abnormal   Collection Time: 12/29/20  8:35 PM   Specimen: BLOOD RIGHT HAND  Result Value Ref Range Status   Specimen Description   Final    BLOOD RIGHT HAND Performed at [facility], [address], [place], [place] [address]    Special Requests   Final    BOTTLES DRAWN AEROBIC AND ANAEROBIC Blood Culture results may not be optimal due to an inadequate volume of blood received in culture bottles Performed at [facility], [address] [place], [place]  [address]    Culture  Setup Time   Final    ANAEROBIC BOTTLE ONLY GRAM POSITIVE COCCI Gram Stain Report Called to,Read Back By and Verified With: RESULTS PREVIOUSLY CALLED RN MARY ANN HAWLERTON @1324  12/30/20 BY JONES,T [facility] Performed at [facility], [address], [place], [place]    Culture (A)  Final    STAPHYLOCOCCUS AUREUS SUSCEPTIBILITIES PERFORMED ON PREVIOUS CULTURE WITHIN THE LAST 5 DAYS. Performed at [facility], [address] [place], [place]    Report Status 01/01/2021 FINAL  Final  Culture, blood (Routine X 2) w Reflex to ID Panel     Status: Abnormal   Collection Time: 12/29/20  8:36 PM   Specimen: BLOOD  Result Value Ref Range Status   Specimen Description   Final    BLOOD RIGHT ANTECUBITAL Performed at [facility], [address] [place], [place]    Special Requests   Final    BOTTLES DRAWN AEROBIC AND ANAEROBIC Blood Culture results may not be optimal due to an inadequate volume of blood received in culture bottles Performed at [facility], [address], [place], [place]    Culture  Setup Time   Final    IN BOTH AEROBIC AND ANAEROBIC BOTTLES GRAM POSITIVE COCCI Gram Stain Report Called to,Read Back By  and Verified With: MARY ANN HAWLERTON, RN @1354  12/30/20 BY JONES,T [facility] CRITICAL VALUE NOTED.  VALUE IS CONSISTENT WITH PREVIOUSLY REPORTED AND CALLED VALUE.    Culture (A)  Final    STAPHYLOCOCCUS AUREUS SUSCEPTIBILITIES PERFORMED ON PREVIOUS CULTURE WITHIN THE LAST 5 DAYS. Performed at [facility], [address], [place], [place]    Report Status 01/01/2021 FINAL  Final  Culture, blood (routine x 2)     Status: None (Preliminary result)   Collection Time: 12/30/20  4:20 PM   Specimen: BLOOD RIGHT ARM  Result Value Ref Range Status   Specimen Description   Final    BLOOD RIGHT ARM Performed at [facility], [address], [place], [place]    Special Requests   Final    BOTTLES  DRAWN AEROBIC AND ANAEROBIC Blood Culture adequate volume Performed at [facility], [address] [place], [place]    Culture  Setup Time   Final    ANAEROBIC BOTTLE ONLY GRAM POSITIVE COCCI Gram Stain Report Called to,Read Back By and Verified With: T HAIRSTON,RN@0307  01/01/21 MKELLY CRITICAL VALUE NOTED.  VALUE IS CONSISTENT WITH PREVIOUSLY REPORTED AND CALLED VALUE. Performed at [facility], [address], [place], [place]    Culture GRAM POSITIVE COCCI  Final   Report Status PENDING  Incomplete  Culture, blood (routine x 2)     Status: None (Preliminary result)   Collection Time: 12/30/20  4:20 PM   Specimen: BLOOD LEFT ARM  Result Value Ref Range Status   Specimen Description BLOOD LEFT ARM  Final   Special Requests   Final    BOTTLES DRAWN AEROBIC AND ANAEROBIC Blood Culture adequate volume   Culture  Setup Time   Final    GRAM POSITIVE COCCI ANAEROBIC BOTTLE Gram Stain Report Called to,Read Back By and Verified With: P BENGTSON 01/01/21 @ 0857 BY 03/03/21 Performed at [facility], [address], [place], [place]    Culture PENDING  Incomplete   Report Status PENDING  Incomplete     Radiology Studies: No results found.  Scheduled Meds: . buprenorphine-naloxone  1 tablet Sublingual BID  . Chlorhexidine Gluconate Cloth  6 each Topical Q0600  . enoxaparin (LOVENOX) injection  40 mg Subcutaneous Q24H  . mupirocin ointment  1 application Nasal BID   Continuous Infusions: . vancomycin       LOS: 4 days   Time spent: 35 mins   Shalina Norfolk [id number], MD How to contact the [facility] Attending or Consulting provider 7A - 7P or covering provider during after hours 7P -7A, for this patient?  1. Check the care team in [facility] and look for a) attending/consulting TR[facility] provider listed and b) the [facility] team listed 2. Log into [email or web address] and use [facility]'s universal password to access. If you do not have the password, please contact the hospital  operator. 3. Locate the [facility] provider you are looking for under [facility] and page to a number that you can be directly reached. 4. If you still have difficulty reaching the provider, please page the [facility] (Director on Call) for the Hospitalists listed on amion for assistance.  01/01/2021, 12:26 PM

## 2021-01-01 NOTE — Progress Notes (Signed)
   01/01/21 1719  Assess: MEWS Score  Temp (!) 101.5 F (38.6 C)  BP (!) 156/99  Pulse Rate (!) 107  Resp 18  Level of Consciousness Alert  SpO2 93 %  O2 Device Room Air  Assess: MEWS Score  MEWS Temp 2  MEWS Systolic 0  MEWS Pulse 1  MEWS RR 0  MEWS LOC 0  MEWS Score 3  MEWS Score Color Yellow  Assess: if the MEWS score is Yellow or Red  Were vital signs taken at a resting state? Yes  Focused Assessment Change from prior assessment (see assessment flowsheet) (increased temp)  Early Detection of Sepsis Score *See Row Information* Low  MEWS guidelines implemented *See Row Information* Yes  Treat  Pain Scale 0-10  Pain Score 10  Pain Type Acute pain  Pain Location Chest  Pain Intervention(s) Medication (See eMAR)  Take Vital Signs  Increase Vital Sign Frequency  Yellow: Q 2hr X 2 then Q 4hr X 2, if remains yellow, continue Q 4hrs  Escalate  MEWS: Escalate Yellow: discuss with charge nurse/RN and consider discussing with provider and RRT  Notify: Charge Nurse/RN  Name of Charge Nurse/RN Notified Crystal Sales promotion account executive  Date Charge Nurse/RN Notified 01/01/21  Time Charge Nurse/RN Notified 1728  Notify: Provider  Provider Name/Title Laural Benes  Date Provider Notified 01/01/21  Time Provider Notified 1721  Notification Type Page  Notification Reason Other (Comment) (yellow MEWS)  Provider response No new orders  Assess: SIRS CRITERIA  SIRS Temperature  1  SIRS Pulse 1  SIRS Respirations  0  SIRS WBC 0  SIRS Score Sum  2

## 2021-01-01 NOTE — Progress Notes (Signed)
    Went to check on the patient as Cardiology has been following for timing of his TEE. He reports his respiratory status has significantly improved and orthopnea now resolved. K+ noted to be 3.0 this AM and replacement has been ordered by the admitting team. Hgb and platelets remain stable.   Continue with plans for TEE tomorrow at 0930 with anesthesia assistance. The importance of compliance with NPO status after midnight was reviewed as he has several snacks and drinks at the bedside.   Signed, Ellsworth Lennox, PA-C 01/01/2021, 10:46 AM Pager: [phone]

## 2021-01-02 ENCOUNTER — Inpatient Hospital Stay (HOSPITAL_COMMUNITY): Payer: Medicaid Other | Admitting: Anesthesiology

## 2021-01-02 ENCOUNTER — Encounter (HOSPITAL_COMMUNITY)
Admission: EM | Disposition: A | Discharge status: Home / Self Care | Payer: Self-pay | Source: Home / Self Care | Attending: Family Medicine

## 2021-01-02 ENCOUNTER — Encounter (HOSPITAL_COMMUNITY): Payer: Self-pay | Admitting: Family Medicine

## 2021-01-02 ENCOUNTER — Inpatient Hospital Stay (HOSPITAL_COMMUNITY): Payer: Medicaid Other

## 2021-01-02 ENCOUNTER — Other Ambulatory Visit: Payer: Self-pay

## 2021-01-02 DIAGNOSIS — R7881 Bacteremia: Secondary | ICD-10-CM | POA: Diagnosis present

## 2021-01-02 DIAGNOSIS — A419 Sepsis, unspecified organism: Secondary | ICD-10-CM | POA: Diagnosis not present

## 2021-01-02 DIAGNOSIS — I76 Septic arterial embolism: Secondary | ICD-10-CM | POA: Diagnosis not present

## 2021-01-02 DIAGNOSIS — E871 Hypo-osmolality and hyponatremia: Secondary | ICD-10-CM | POA: Diagnosis not present

## 2021-01-02 DIAGNOSIS — J189 Pneumonia, unspecified organism: Secondary | ICD-10-CM | POA: Diagnosis not present

## 2021-01-02 HISTORY — PX: TEE WITHOUT CARDIOVERSION: SHX5443

## 2021-01-02 LAB — CBC WITH DIFFERENTIAL/PLATELET
Abs Immature Granulocytes: 0.07 10*3/uL (ref 0.00–0.07)
Basophils Absolute: 0.1 10*3/uL (ref 0.0–0.1)
Basophils Relative: 1 %
Eosinophils Absolute: 0.1 10*3/uL (ref 0.0–0.5)
Eosinophils Relative: 1 %
HCT: 35.1 % — ABNORMAL LOW (ref 39.0–52.0)
Hemoglobin: 11.8 g/dL — ABNORMAL LOW (ref 13.0–17.0)
Immature Granulocytes: 1 %
Lymphocytes Relative: 18 %
Lymphs Abs: 1.9 10*3/uL (ref 0.7–4.0)
MCH: 29.3 pg (ref 26.0–34.0)
MCHC: 33.6 g/dL (ref 30.0–36.0)
MCV: 87.1 fL (ref 80.0–100.0)
Monocytes Absolute: 1.2 10*3/uL — ABNORMAL HIGH (ref 0.1–1.0)
Monocytes Relative: 11 %
Neutro Abs: 7.6 10*3/uL (ref 1.7–7.7)
Neutrophils Relative %: 68 %
Platelets: 238 10*3/uL (ref 150–400)
RBC: 4.03 MIL/uL — ABNORMAL LOW (ref 4.22–5.81)
RDW: 14.1 % (ref 11.5–15.5)
WBC: 11 10*3/uL — ABNORMAL HIGH (ref 4.0–10.5)
nRBC: 0 % (ref 0.0–0.2)

## 2021-01-02 LAB — COMPREHENSIVE METABOLIC PANEL
ALT: 51 U/L — ABNORMAL HIGH (ref 0–44)
AST: 51 U/L — ABNORMAL HIGH (ref 15–41)
Albumin: 2.1 g/dL — ABNORMAL LOW (ref 3.5–5.0)
Alkaline Phosphatase: 103 U/L (ref 38–126)
Anion gap: 9 (ref 5–15)
BUN: 7 mg/dL (ref 6–20)
CO2: 24 mmol/L (ref 22–32)
Calcium: 7.2 mg/dL — ABNORMAL LOW (ref 8.9–10.3)
Chloride: 102 mmol/L (ref 98–111)
Creatinine, Ser: 0.56 mg/dL — ABNORMAL LOW (ref 0.61–1.24)
GFR, Estimated: >60 mL/min (ref >60)
Glucose, Bld: 116 mg/dL — ABNORMAL HIGH (ref 70–99)
Potassium: 3.6 mmol/L (ref 3.5–5.1)
Sodium: 135 mmol/L (ref 135–145)
Total Bilirubin: 0.8 mg/dL (ref 0.3–1.2)
Total Protein: 5.7 g/dL — ABNORMAL LOW (ref 6.5–8.1)

## 2021-01-02 LAB — MAGNESIUM: Magnesium: 1.9 mg/dL (ref 1.7–2.4)

## 2021-01-02 SURGERY — ECHOCARDIOGRAM, TRANSESOPHAGEAL
Anesthesia: General

## 2021-01-02 MED ORDER — PROPOFOL 10 MG/ML IV BOLUS
INTRAVENOUS | Status: AC
  Filled 2021-01-02: qty 20

## 2021-01-02 MED ORDER — LIDOCAINE HCL (CARDIAC) PF 100 MG/5ML IV SOSY
PREFILLED_SYRINGE | INTRAVENOUS | Status: DC | PRN
  Administered 2021-01-02: 100 mg via INTRAVENOUS

## 2021-01-02 MED ORDER — LACTATED RINGERS IV SOLN: INTRAVENOUS | Status: DC

## 2021-01-02 MED ORDER — MIDAZOLAM HCL 2 MG/2ML IJ SOLN
INTRAMUSCULAR | Status: AC
  Filled 2021-01-02: qty 2

## 2021-01-02 MED ORDER — CHLORHEXIDINE GLUCONATE 0.12 % MT SOLN: 15.0000 mL | Freq: Once | OROMUCOSAL | Status: DC

## 2021-01-02 MED ORDER — ORAL CARE MOUTH RINSE: 15.0000 mL | Freq: Once | OROMUCOSAL | Status: DC

## 2021-01-02 MED ORDER — LACTATED RINGERS IV BOLUS: 500.0000 mL | Freq: Once | INTRAVENOUS | Status: DC

## 2021-01-02 MED ORDER — DEXMEDETOMIDINE (PRECEDEX) IN NS 20 MCG/5ML (4 MCG/ML) IV SYRINGE
PREFILLED_SYRINGE | INTRAVENOUS | Status: AC
  Filled 2021-01-02: qty 10

## 2021-01-02 MED ORDER — MIDAZOLAM HCL 2 MG/2ML IJ SOLN
INTRAMUSCULAR | Status: DC | PRN
  Administered 2021-01-02: 2 mg via INTRAVENOUS

## 2021-01-02 MED ORDER — LIDOCAINE HCL (PF) 2 % IJ SOLN
INTRAMUSCULAR | Status: AC
  Filled 2021-01-02: qty 5

## 2021-01-02 MED ORDER — NICOTINE 21 MG/24HR TD PT24
21.0000 mg | MEDICATED_PATCH | Freq: Every day | TRANSDERMAL | Status: DC
  Administered 2021-01-02 – 2021-01-04 (×3): 21 mg via TRANSDERMAL
  Filled 2021-01-02 (×3): qty 1

## 2021-01-02 MED ORDER — PROPOFOL 500 MG/50ML IV EMUL
INTRAVENOUS | Status: DC | PRN
  Administered 2021-01-02: 150 ug/kg/min via INTRAVENOUS

## 2021-01-02 MED ORDER — PROPOFOL 10 MG/ML IV BOLUS
INTRAVENOUS | Status: DC | PRN
  Administered 2021-01-02 (×2): 100 mg via INTRAVENOUS

## 2021-01-02 MED ORDER — LIDOCAINE VISCOUS HCL 2 % MT SOLN
OROMUCOSAL | Status: AC
  Filled 2021-01-02: qty 15

## 2021-01-02 MED ORDER — LACTATED RINGERS IV SOLN: INTRAVENOUS | Status: DC | PRN

## 2021-01-02 MED ORDER — DEXMEDETOMIDINE (PRECEDEX) IN NS 20 MCG/5ML (4 MCG/ML) IV SYRINGE
PREFILLED_SYRINGE | INTRAVENOUS | Status: DC | PRN
  Administered 2021-01-02: 20 ug via INTRAVENOUS

## 2021-01-02 MED ORDER — SODIUM CHLORIDE BACTERIOSTATIC 0.9 % IJ SOLN
INTRAMUSCULAR | Status: AC
  Filled 2021-01-02: qty 20

## 2021-01-02 NOTE — Addendum Note (Signed)
Addendum  created 01/02/21 1127 by Julian Reil, CRNA   Flowsheet accepted

## 2021-01-02 NOTE — Anesthesia Preprocedure Evaluation (Signed)
Anesthesia Evaluation  Patient identified by MRN, date of birth, ID band Patient awake    Reviewed: Allergy & Precautions, NPO status , Patient's Chart, lab work & pertinent test results  Airway Mallampati: I  TM Distance: >3 FB Neck ROM: Full    Dental  (+) Missing, Poor Dentition, Chipped, Dental Advisory Given   Pulmonary pneumonia, Current Smoker and Patient abstained from smoking.,    Pulmonary exam normal breath sounds clear to auscultation (-) decreased breath sounds      Cardiovascular Exercise Tolerance: Good Normal cardiovascular exam Rhythm:Regular Rate:Normal     Neuro/Psych Seizures -, Well Controlled,     GI/Hepatic negative GI ROS, (+)     substance abuse  marijuana use, methamphetamine use and IV drug use, Hepatitis -, C  Endo/Other  negative endocrine ROS  Renal/GU negative Renal ROS     Musculoskeletal  (+) narcotic dependent  Abdominal   Peds  Hematology negative hematology ROS (+)   Anesthesia Other Findings   Reproductive/Obstetrics                            Anesthesia Physical Anesthesia Plan  ASA: IV  Anesthesia Plan: General   Post-op Pain Management:    Induction: Intravenous  PONV Risk Score and Plan: Propofol infusion  Airway Management Planned: Nasal Cannula and Natural Airway  Additional Equipment:   Intra-op Plan:   Post-operative Plan:   Informed Consent: I have reviewed the patients History and Physical, chart, labs and discussed the procedure including the risks, benefits and alternatives for the proposed anesthesia with the patient or authorized representative who has indicated his/her understanding and acceptance.     Dental advisory given  Plan Discussed with: CRNA and Surgeon  Anesthesia Plan Comments:        Anesthesia Quick Evaluation

## 2021-01-02 NOTE — Anesthesia Postprocedure Evaluation (Signed)
Anesthesia Post Note  Patient: George Lucas  Procedure(s) Performed: TRANSESOPHAGEAL ECHOCARDIOGRAM  (TEE) WITH PROPOFOL (N/A )  Patient location during evaluation: PACU Anesthesia Type: General Level of consciousness: awake and alert and oriented Pain management: pain level controlled Vital Signs Assessment: post-procedure vital signs reviewed and stable Respiratory status: spontaneous breathing and respiratory function stable Cardiovascular status: blood pressure returned to baseline and stable Postop Assessment: no apparent nausea or vomiting Anesthetic complications: no   No complications documented.   Last Vitals:  Vitals:   01/02/21 1030 01/02/21 1054  BP: 110/67 100/70  Pulse: 66 68  Resp: (!) 23 20  Temp:  36.8 C  SpO2: 97% 100%    Last Pain:  Vitals:   01/02/21 1054  TempSrc: Oral  PainSc:                  George Lucas

## 2021-01-02 NOTE — Progress Notes (Signed)
TEE completed.   Santino Kinsella, RDCS 

## 2021-01-02 NOTE — Progress Notes (Signed)
PROGRESS NOTE   George Lucas  OZD:[id number] DOB: 01/10/1991 DOA: 12/28/2020 PCP: System, Provider Not In   Chief Complaint  Patient presents with  . Chest Pain   Level of care: Med-Surg  Brief Admission History:  30 y.o. male with medical history significant of nicotine dependence, IVDU presented to ED with a complaint of chest pain.  Currently patient, he woke up at 10 o'clock in the morning with the chest pain.  He then had fever of 104 at around 2:30 AM.  He also started having some dry cough.  He arrived to the ED at around 2 PM as he did not have a ride.  No other complaint.  He claims that he has been clean and has not used IVDU in about 2 months but continues to smoke.  ED Course: Upon arrival to ED, he was tachycardic and tachypneic and afebrile.  CBC showed white cells of 17.  BMP showed hyponatremia and hyperglycemia.  Chest x-ray unremarkable however CT angiogram of the chest showed multifocal pneumonia and septic emboli.  Patient received vancomycin in the ED as well as 1 L of IV fluid bolus.  Hospitalist service were consulted to admit the patient for further management for possible bacterial endocarditis.  Assessment & Plan:   Active Problems:   Multifocal pneumonia   Severe sepsis (HCC)   Septic embolism (HCC)   Acute hyponatremia   Severe sepsis from MRSA bacteremia and multifocal pneumonia - Continue IV vancomcycin per ID recommendation, continue to attempt for TEE now planned for 5/13.  Repeated BC 12/30/20 persistently positive with G+ cocci MRSA.   Follow up results of TEE.   Persistent bacteremia - continue IV vancomycin, repeated BC 5/12 to document clearance of bacteremia.  Agree with ID if bacteremia persists will need transfer to [facility] for Angiovac consideration.  Will discuss with ID.    Hyponatremia - improving.    Hypokalemia - oral replacement ordered, Mg has been repleted.    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  ID recommending to  keep in hospital for 2 weeks of IV therapy followed by long acting therapy.   He may need transfer to [facility] for angiovac procedure due to persistent bacteremia despite appropriate IV antibiotic.   DVT prophylaxis: enoxaparin  Code Status: full  Family Communication:  Disposition: 2 weeks in hospital  Status is: Inpatient  Remains inpatient appropriate because:IV treatments appropriate due to intensity of illness or inability to take PO and Inpatient level of care appropriate due to severity of illness  Dispo: The patient is from: Home              Anticipated d/c is to: Home              Patient currently is not medically stable to d/c.   Difficult to place patient No  Consultants:   ID   Cardiology   Procedures:   Tentative TEE for 5/13   Antimicrobials:  Vancomycin  5/10>>  Subjective: Pt feeling terrible, sweating, febrile, chest discomfort with deep breaths.    Objective: Vitals:   01/01/21 1938 01/01/21 2107 01/02/21 0516 01/02/21 0911  BP: 129/69 122/71 134/75 111/63  Pulse: (!) 101 83 100 78  Resp: 18 18 18  (!) 22  Temp: (!) 100.5 F (38.1 C) 98.5 F (36.9 C) (!) 100.7 F (38.2 C) 98.3 F (36.8 C)  TempSrc: Oral Oral Oral Oral  SpO2: 95% 98% 93% 96%  Weight:      Height:  Intake/Output Summary (Last 24 hours) at 01/02/2021 0940 Last data filed at 01/02/2021 0328 Gross per 24 hour  Intake 1806.21 ml  Output --  Net 1806.21 ml   Filed Weights   12/28/20 1702  Weight: 81.6 kg    Examination:  General exam: diaphoretic male, awake, alert, Appears calm and NAD.   Respiratory system: Clear to auscultation. Respiratory effort normal. Cardiovascular system: normal S1 & S2 heard. No JVD, rubs, gallops or clicks. No pedal edema. Gastrointestinal system: Abdomen is nondistended, soft and nontender. No organomegaly or masses felt. Normal bowel sounds heard. Central nervous system: Alert and oriented. No focal neurological deficits. Extremities: Symmetric  5 x 5 power. Skin: No rashes, lesions or ulcers Psychiatry: Judgement and insight appear poor. Mood & affect appropriate.   Data Reviewed: I have personally reviewed following labs and imaging studies  CBC: Recent Labs  Lab 12/28/20 1325 12/29/20 0410 12/30/20 0451 12/31/20 0252 01/01/21 0612 01/02/21 0507  WBC 17.0* 12.6* 11.6* 10.2 9.8 11.0*  NEUTROABS 14.8*  --   --   --  6.7 7.6  HGB 15.9 14.4 13.4 13.2 12.6* 11.8*  HCT 46.6 44.2 39.8 39.3 36.9* 35.1*  MCV 87.1 89.8 88.2 88.1 86.4 87.1  PLT 161 140* 160 146* 171 238    Basic Metabolic Panel: Recent Labs  Lab 12/28/20 1601 12/29/20 0410 12/30/20 0451 12/31/20 0252 01/01/21 0612 01/02/21 0507  NA  --  131* 132* 131* 131* 135  K  --  3.5 3.3* 3.2* 3.0* 3.6  CL  --  97* 99 99 101 102  CO2  --  GLUCOSE  --  107* 115* 113* 158* 116*  BUN  --  CREATININE  --  0.62 0.57* 0.56* 0.62 0.56*  CALCIUM  --  8.1* 7.8* 7.4* 7.1* 7.2*  MG 1.8  --  1.8 1.6* 2.0 1.9    GFR: Estimated Creatinine Clearance: 157.3 mL/min (A) (by C-G formula based on SCr of 0.56 mg/dL (L)).  Liver Function Tests: Recent Labs  Lab 12/28/20 1325 01/01/21 0612 01/02/21 0507  AST 33 39 51*  ALT 44 41 51*  ALKPHOS 76 120 103  BILITOT 0.7 0.7 0.8  PROT 7.2 5.5* 5.7*  ALBUMIN 3.5 2.1* 2.1*    CBG: No results for input(s): GLUCAP in the last 168 hours.  Recent Results (from the past 240 hour(s))  Resp Panel by RT-PCR (Flu A&B, Covid) Nasopharyngeal Swab     Status: None   Collection Time: 12/28/20  2:29 PM   Specimen: Nasopharyngeal Swab; Nasopharyngeal(NP) swabs in vial transport medium  Result Value Ref Range Status   SARS Coronavirus 2 by RT PCR NEGATIVE NEGATIVE Final    Comment: (NOTE) SARS-CoV-2 target nucleic acids are NOT DETECTED.  The SARS-CoV-2 RNA is generally detectable in upper respiratory specimens during the acute phase of infection. The lowest concentration of SARS-CoV-2 viral copies this  assay can detect is 138 copies/mL. A negative result does not preclude SARS-Cov-2 infection and should not be used as the sole basis for treatment or other patient management decisions. A negative result may occur with  improper specimen collection/handling, submission of specimen other than nasopharyngeal swab, presence of viral mutation(s) within the areas targeted by this assay, and inadequate number of viral copies(<138 copies/mL). A negative result must be combined with clinical observations, patient history, and epidemiological information. The expected result is Negative.  Fact Sheet for Patients:  BloggerCourse.com  Fact Sheet for  Healthcare Providers:  SeriousBroker.it  This test is no t yet approved or cleared by the [place] and  has been authorized for detection and/or diagnosis of SARS-CoV-2 by FDA under an Emergency Use Authorization (EUA). This EUA will remain  in effect (meaning this test can be used) for the duration of the COVID-19 declaration under Section 564(b)(1) of the Act, 21 U.S.C.section 360bbb-3(b)(1), unless the authorization is terminated  or revoked sooner.       Influenza A by PCR NEGATIVE NEGATIVE Final   Influenza B by PCR NEGATIVE NEGATIVE Final    Comment: (NOTE) The Xpert Xpress SARS-CoV-2/FLU/RSV plus assay is intended as an aid in the diagnosis of influenza from Nasopharyngeal swab specimens and should not be used as a sole basis for treatment. Nasal washings and aspirates are unacceptable for Xpert Xpress SARS-CoV-2/FLU/RSV testing.  Fact Sheet for Patients: BloggerCourse.com  Fact Sheet for Healthcare Providers: SeriousBroker.it  This test is not yet approved or cleared by the [place] FDA and has been authorized for detection and/or diagnosis of SARS-CoV-2 by FDA under an Emergency Use Authorization (EUA). This EUA will  remain in effect (meaning this test can be used) for the duration of the COVID-19 declaration under Section 564(b)(1) of the Act, 21 U.S.C. section 360bbb-3(b)(1), unless the authorization is terminated or revoked.  Performed at [facility], [address], [place], [place] [address]   Blood culture (routine x 2)     Status: Abnormal   Collection Time: 12/28/20  5:21 PM   Specimen: BLOOD RIGHT HAND  Result Value Ref Range Status   Specimen Description   Final    BLOOD RIGHT HAND Performed at [facility], [address], [place], [place] [address]    Special Requests   Final    BOTTLES DRAWN AEROBIC AND ANAEROBIC Blood Culture adequate volume Performed at [facility], [address], [place], [place] [address]    Culture  Setup Time   Final    GRAM POSITIVE COCCI IN BOTH AEROBIC AND ANAEROBIC BOTTLES Gram Stain Report Called to,Read Back By and Verified With: HOWARDTON,M@0727  BY MATTHEWS, B 5.9.22 CRITICAL RESULT CALLED TO, READ BACK BY AND VERIFIED WITH: RN Denman George AT 1813 12/29/2020 BY L BENFIELD Performed at [facility], [address], [place], [place] [address]    Culture METHICILLIN RESISTANT STAPHYLOCOCCUS AUREUS (A)  Final   Report Status 12/31/2020 FINAL  Final   Organism ID, Bacteria METHICILLIN RESISTANT STAPHYLOCOCCUS AUREUS  Final      Susceptibility   Methicillin resistant staphylococcus aureus - MIC*    CIPROFLOXACIN <=0.5 SENSITIVE Sensitive     ERYTHROMYCIN >=8 RESISTANT Resistant     GENTAMICIN <=0.5 SENSITIVE Sensitive     OXACILLIN >=4 RESISTANT Resistant     TETRACYCLINE <=1 SENSITIVE Sensitive     VANCOMYCIN <=0.5 SENSITIVE Sensitive     TRIMETH/SULFA <=10 SENSITIVE Sensitive     CLINDAMYCIN <=0.25 SENSITIVE Sensitive     RIFAMPIN <=0.5 SENSITIVE Sensitive     Inducible Clindamycin NEGATIVE Sensitive     * METHICILLIN RESISTANT STAPHYLOCOCCUS AUREUS  Blood culture (routine x 2)     Status: Abnormal   Collection Time: 12/28/20  5:21 PM    Specimen: BLOOD LEFT HAND  Result Value Ref Range Status   Specimen Description   Final    BLOOD LEFT HAND Performed at [facility], [address] [place], [place] [address]    Special Requests   Final    BOTTLES DRAWN AEROBIC AND  ANAEROBIC Blood Culture adequate volume Performed at [facility], [address] [place], [place] [address]    Culture  Setup Time   Final    GRAM POSITIVE COCCI ANAEROBIC AND AEROBIC BOTTLES Gram Stain Report Called to,Read Back By and Verified With: HOWARDTON,M@0727  BY MATTHEWS, B 5.9.22 Performed at [facility], [address] [place], [place] [address]    Culture (A)  Final    STAPHYLOCOCCUS AUREUS SUSCEPTIBILITIES PERFORMED ON PREVIOUS CULTURE WITHIN THE LAST 5 DAYS. Performed at [facility], [address] [place], [place] [address]    Report Status 12/31/2020 FINAL  Final  Blood Culture ID Panel (Reflexed)     Status: Abnormal   Collection Time: 12/28/20  5:21 PM  Result Value Ref Range Status   Enterococcus faecalis NOT DETECTED NOT DETECTED Final   Enterococcus Faecium NOT DETECTED NOT DETECTED Final   Listeria monocytogenes NOT DETECTED NOT DETECTED Final   Staphylococcus species DETECTED (A) NOT DETECTED Final    Comment: CRITICAL RESULT CALLED TO, READ BACK BY AND VERIFIED WITH: RN M HOWERTON AT 1813 12/29/2020 BY L BENFIELD    Staphylococcus aureus (BCID) DETECTED (A) NOT DETECTED Final    Comment: Methicillin (oxacillin)-resistant Staphylococcus aureus (MRSA). MRSA is predictably resistant to beta-lactam antibiotics (except ceftaroline). Preferred therapy is vancomycin unless clinically contraindicated. Patient requires contact precautions if  hospitalized. CRITICAL RESULT CALLED TO, READ BACK BY AND VERIFIED WITH: RN Denman GeorgeM HOWERTON AT 1813 12/29/2020 BY L BENFIELD    Staphylococcus epidermidis NOT DETECTED NOT DETECTED Final   Staphylococcus lugdunensis NOT DETECTED NOT DETECTED Final   Streptococcus species NOT DETECTED NOT  DETECTED Final   Streptococcus agalactiae NOT DETECTED NOT DETECTED Final   Streptococcus pneumoniae NOT DETECTED NOT DETECTED Final   Streptococcus pyogenes NOT DETECTED NOT DETECTED Final   A.calcoaceticus-baumannii NOT DETECTED NOT DETECTED Final   Bacteroides fragilis NOT DETECTED NOT DETECTED Final   Enterobacterales NOT DETECTED NOT DETECTED Final   Enterobacter cloacae complex NOT DETECTED NOT DETECTED Final   Escherichia coli NOT DETECTED NOT DETECTED Final   Klebsiella aerogenes NOT DETECTED NOT DETECTED Final   Klebsiella oxytoca NOT DETECTED NOT DETECTED Final   Klebsiella pneumoniae NOT DETECTED NOT DETECTED Final   Proteus species NOT DETECTED NOT DETECTED Final   Salmonella species NOT DETECTED NOT DETECTED Final   Serratia marcescens NOT DETECTED NOT DETECTED Final   Haemophilus influenzae NOT DETECTED NOT DETECTED Final   Neisseria meningitidis NOT DETECTED NOT DETECTED Final   Pseudomonas aeruginosa NOT DETECTED NOT DETECTED Final   Stenotrophomonas maltophilia NOT DETECTED NOT DETECTED Final   Candida albicans NOT DETECTED NOT DETECTED Final   Candida auris NOT DETECTED NOT DETECTED Final   Candida glabrata NOT DETECTED NOT DETECTED Final   Candida krusei NOT DETECTED NOT DETECTED Final   Candida parapsilosis NOT DETECTED NOT DETECTED Final   Candida tropicalis NOT DETECTED NOT DETECTED Final   Cryptococcus neoformans/gattii NOT DETECTED NOT DETECTED Final   Meth resistant mecA/C and MREJ DETECTED (A) NOT DETECTED Final    Comment: CRITICAL RESULT CALLED TO, READ BACK BY AND VERIFIED WITH: RN Denman GeorgeM HOWERTON AT 1813 12/29/2020 BY L BENFIELD Performed at [facility], [address] [place], [place] [address]   Respiratory (~20 pathogens) panel by PCR     Status: None   Collection Time: 12/29/20  7:30 AM   Specimen: Nasopharyngeal Swab; Respiratory  Result Value Ref Range Status   Adenovirus NOT DETECTED NOT DETECTED Final   Coronavirus 229E  NOT DETECTED NOT  DETECTED Final    Comment: (NOTE) The Coronavirus on the Respiratory Panel, DOES NOT test for the novel  Coronavirus (2019 nCoV)    Coronavirus HKU1 NOT DETECTED NOT DETECTED Final   Coronavirus NL63 NOT DETECTED NOT DETECTED Final   Coronavirus OC43 NOT DETECTED NOT DETECTED Final   Metapneumovirus NOT DETECTED NOT DETECTED Final   Rhinovirus / Enterovirus NOT DETECTED NOT DETECTED Final   Influenza A NOT DETECTED NOT DETECTED Final   Influenza B NOT DETECTED NOT DETECTED Final   Parainfluenza Virus 1 NOT DETECTED NOT DETECTED Final   Parainfluenza Virus 2 NOT DETECTED NOT DETECTED Final   Parainfluenza Virus 3 NOT DETECTED NOT DETECTED Final   Parainfluenza Virus 4 NOT DETECTED NOT DETECTED Final   Respiratory Syncytial Virus NOT DETECTED NOT DETECTED Final   Bordetella pertussis NOT DETECTED NOT DETECTED Final   Bordetella Parapertussis NOT DETECTED NOT DETECTED Final   Chlamydophila pneumoniae NOT DETECTED NOT DETECTED Final   Mycoplasma pneumoniae NOT DETECTED NOT DETECTED Final    Comment: Performed at [facility], [address], [place], [place] [address]  MRSA PCR Screening     Status: Abnormal   Collection Time: 12/29/20  7:32 AM   Specimen: Nasal Mucosa; Nasopharyngeal  Result Value Ref Range Status   MRSA by PCR POSITIVE (A) NEGATIVE Final    Comment:        The GeneXpert MRSA Assay (FDA approved for NASAL specimens only), is one component of a comprehensive MRSA colonization surveillance program. It is not intended to diagnose MRSA infection nor to guide or monitor treatment for MRSA infections. RESULT CALLED TO, READ BACK BY AND VERIFIED WITH: HOWERTON M. AT 1018AM ON [id number] BY THOMPSON S. Performed at [facility], [address] [place], [place] [address]   Culture, blood (Routine X 2) w Reflex to ID Panel     Status: Abnormal   Collection Time: 12/29/20  8:35 PM   Specimen: BLOOD RIGHT HAND  Result Value Ref Range Status   Specimen Description    Final    BLOOD RIGHT HAND Performed at [facility], [address], [place], [place] [address]    Special Requests   Final    BOTTLES DRAWN AEROBIC AND ANAEROBIC Blood Culture results may not be optimal due to an inadequate volume of blood received in culture bottles Performed at [facility], [address] [place], [place] [address]    Culture  Setup Time   Final    ANAEROBIC BOTTLE ONLY GRAM POSITIVE COCCI Gram Stain Report Called to,Read Back By and Verified With: RESULTS PREVIOUSLY CALLED RN MARY ANN HAWLERTON @1324  12/30/20 BY JONES,T [facility] Performed at [facility], [address], [place], [place]    Culture (A)  Final    STAPHYLOCOCCUS AUREUS SUSCEPTIBILITIES PERFORMED ON PREVIOUS CULTURE WITHIN THE LAST 5 DAYS. Performed at [facility], [address] [place], [place]    Report Status 01/01/2021 FINAL  Final  Culture, blood (Routine X 2) w Reflex to ID Panel     Status: Abnormal   Collection Time: 12/29/20  8:36 PM   Specimen: BLOOD  Result Value Ref Range Status   Specimen Description   Final    BLOOD RIGHT ANTECUBITAL Performed at [facility], [address], [place], [place]    Special Requests   Final    BOTTLES DRAWN AEROBIC AND ANAEROBIC Blood Culture results may not be optimal due to an inadequate  volume of blood received in culture bottles Performed at [facility], [address], [place], [place] [address]    Culture  Setup Time   Final    IN BOTH AEROBIC AND ANAEROBIC BOTTLES GRAM POSITIVE COCCI Gram Stain Report Called to,Read Back By and Verified With: MARY ANN HAWLERTON, RN  12/30/20 BY JONES,T [facility] CRITICAL VALUE NOTED.  VALUE IS CONSISTENT WITH PREVIOUSLY REPORTED AND CALLED VALUE.    Culture (A)  Final    STAPHYLOCOCCUS AUREUS SUSCEPTIBILITIES PERFORMED ON PREVIOUS CULTURE WITHIN THE LAST 5 DAYS. Performed at [facility], [address], [place], [place] [address]    Report Status 01/01/2021 FINAL   Final  Culture, blood (routine x 2)     Status: None (Preliminary result)   Collection Time: 12/30/20  4:20 PM   Specimen: BLOOD RIGHT ARM  Result Value Ref Range Status   Specimen Description   Final    BLOOD RIGHT ARM Performed at [facility], [address] [place], [place] [address]    Special Requests   Final    BOTTLES DRAWN AEROBIC AND ANAEROBIC Blood Culture adequate volume Performed at [facility], [address], [place], [place] [address]    Culture  Setup Time   Final    ANAEROBIC BOTTLE ONLY GRAM POSITIVE COCCI Gram Stain Report Called to,Read Back By and Verified With: T HAIRSTON,RN@0307  01/01/21 MKELLY CRITICAL VALUE NOTED.  VALUE IS CONSISTENT WITH PREVIOUSLY REPORTED AND CALLED VALUE. Performed at [facility], [address] [place], [place] [address]    Culture GRAM POSITIVE COCCI  Final   Report Status PENDING  Incomplete  Culture, blood (routine x 2)     Status: None (Preliminary result)   Collection Time: 12/30/20  4:20 PM   Specimen: BLOOD LEFT ARM  Result Value Ref Range Status   Specimen Description   Final    BLOOD LEFT ARM Performed at [facility], [address] [place], [place] [address]    Special Requests   Final    BOTTLES DRAWN AEROBIC AND ANAEROBIC Blood Culture adequate volume Performed at [facility], [address], [place], [place] [address]    Culture  Setup Time   Final    GRAM POSITIVE COCCI ANAEROBIC BOTTLE Gram Stain Report Called to,Read Back By and Verified With: P BENGTSON 01/01/21 @ 0857 BY Chauncey Mann Performed at [facility], [address], [place], [place] [address]    Culture [facility] POSITIVE COCCI  Final   Report Status PENDING  Incomplete  Culture, blood (Routine X 2) w Reflex to ID Panel     Status: None (Preliminary result)   Collection Time: 01/01/21  6:41 PM   Specimen: BLOOD LEFT ARM  Result Value Ref Range Status   Specimen Description BLOOD LEFT ARM  Final   Special Requests   Final    BOTTLES DRAWN AEROBIC  AND ANAEROBIC Blood Culture results may not be optimal due to an excessive volume of blood received in culture bottles   Culture   Final    NO GROWTH < 24 HOURS Performed at [facility], [address] [place], [place] [address]    Report Status PENDING  Incomplete  Culture, blood (Routine X 2) w Reflex to ID Panel     Status: None (Preliminary result)   Collection Time: 01/01/21  6:41 PM   Specimen: BLOOD RIGHT ARM  Result Value Ref Range Status   Specimen Description BLOOD RIGHT ARM  Final   Special Requests  Final    BOTTLES DRAWN AEROBIC ONLY Blood Culture results may not be optimal due to an inadequate volume of blood received in culture bottles   Culture   Final    NO GROWTH < 24 HOURS Performed at [facility], [address], [place], [place] [address]    Report Status PENDING  Incomplete     Radiology Studies: No results found.  Scheduled Meds: . [MAR Hold] buprenorphine-naloxone  1 tablet Sublingual BID  . Chlorhexidine Gluconate Cloth  6 each Topical Q0600  . [MAR Hold] cholecalciferol  2,000 Units Oral Daily  . [MAR Hold] enoxaparin (LOVENOX) injection  40 mg Subcutaneous Q24H  . [MAR Hold] mupirocin ointment  1 application Nasal BID  . [MAR Hold] polyethylene glycol  17 g Oral Daily  . [MAR Hold] senna-docusate  2 tablet Oral QHS  . sodium chloride bacteriostatic       Continuous Infusions: . [MAR Hold] vancomycin 1,000 mg (01/02/21 0521)     LOS: 5 days   Time spent: 35 mins   Kati Riggenbach Laural Benes, MD How to contact the [facility] Attending or Consulting provider 7A - 7P or covering provider during after hours 7P -7A, for this patient?  1. Check the care team in [facility] and look for a) attending/consulting [facility] provider listed and b) the [facility] team listed 2. Log into [email or web address] and use 's universal password to access. If you do not have the password, please contact the hospital operator. 3. Locate the [facility] provider you are looking for under [facility]  and page to a number that you can be directly reached. 4. If you still have difficulty reaching the provider, please page the [facility] (Director on Call) for the Hospitalists listed on amion for assistance.  01/02/2021, 9:40 AM

## 2021-01-02 NOTE — CV Procedure (Signed)
Transesophageal echocardiogram  Indication: Bacteremia, rule out valvular vegetations  Description of procedure: After informed consent was obtained, patient was taken to the procedure suite where a timeout was performed.  He was positioned left lateral decubitus, viscous lidocaine was given, and subsequently deep sedation was achieved per the Anesthesia service with use of Versed and propofol, please refer to their records for dose administration and monitoring.  With bite-block in place a multiplane transesophageal echocardiographic probe was inserted into the esophagus and multiple images obtained with the following findings:  1. Left ventricular ejection fraction, by estimation, is 60 to 65%. The  left ventricle has normal function.  2. Right ventricular systolic function is normal. The right ventricular  size is normal.  3. No left atrial/left atrial appendage thrombus was detected.  4. The mitral valve is grossly normal. Trivial mitral valve  regurgitation.  5. The aortic valve is tricuspid. Aortic valve regurgitation is not  visualized.  6. Descending aorta without significant atherosclerosis.  7. No valvular vegetations.   Patient tolerated the procedure well without obvious complications.  Jonelle Sidle, M.D., F.A.C.C.

## 2021-01-02 NOTE — Progress Notes (Signed)
      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  He had low quality TTE   TEE done today is without vegetations--I presume because he embolized all of what he might have had to the lungs  Repeat blood cultures taken 5/10/2022are STILL growing MRSA, 01/01/2021 no growth so far.  Hopefully he will blood cultures  For that he stay in the hospital for a good portion of his therapy and then to be given intravenously in the hospital.    HCV + ab: Back RNA and genotype would be happy to see him in the clinic and treat his hepatitis C.  I will continue to be on call this weekend and I am available for questions.   I am also happy to see him in our clinic for followup or arrange this with one of my partners.  [name] 01/02/2021, 5:25 PM

## 2021-01-02 NOTE — Transfer of Care (Signed)
Immediate Anesthesia Transfer of Care Note  Patient: George Lucas  Procedure(s) Performed: TRANSESOPHAGEAL ECHOCARDIOGRAM  (TEE) WITH PROPOFOL (N/A )  Patient Location: PACU  Anesthesia Type:General  Level of Consciousness: drowsy  Airway & Oxygen Therapy: Patient Spontanous Breathing and Patient connected to nasal cannula oxygen  Post-op Assessment: Report given to RN and Post -op Vital signs reviewed and stable  Post vital signs: Reviewed and stable  Last Vitals:  Vitals Value Taken Time  BP    Temp    Pulse 65 01/02/21 0957  Resp 25 01/02/21 0957  SpO2 100 % 01/02/21 0957  Vitals shown include unvalidated device data.  Last Pain:  Vitals:   01/02/21 0911  TempSrc: Oral  PainSc: 6       Patients Stated Pain Goal: 2 (01/02/21 0721)  Complications: No complications documented.

## 2021-01-02 NOTE — Anesthesia Procedure Notes (Signed)
Date/Time: 01/02/2021 9:44 AM Performed by: Julian Reil, CRNA Pre-anesthesia Checklist: Patient identified, Emergency Drugs available, Suction available and Patient being monitored Oxygen Delivery Method: Nasal cannula Induction Type: IV induction Placement Confirmation: positive ETCO2

## 2021-01-03 ENCOUNTER — Inpatient Hospital Stay (HOSPITAL_COMMUNITY): Payer: Medicaid Other

## 2021-01-03 DIAGNOSIS — A419 Sepsis, unspecified organism: Secondary | ICD-10-CM | POA: Diagnosis not present

## 2021-01-03 DIAGNOSIS — I76 Septic arterial embolism: Secondary | ICD-10-CM | POA: Diagnosis not present

## 2021-01-03 DIAGNOSIS — J189 Pneumonia, unspecified organism: Secondary | ICD-10-CM | POA: Diagnosis not present

## 2021-01-03 DIAGNOSIS — E871 Hypo-osmolality and hyponatremia: Secondary | ICD-10-CM | POA: Diagnosis not present

## 2021-01-03 LAB — CBC WITH DIFFERENTIAL/PLATELET
Abs Immature Granulocytes: 0.12 10*3/uL — ABNORMAL HIGH (ref 0.00–0.07)
Basophils Absolute: 0.1 10*3/uL (ref 0.0–0.1)
Basophils Relative: 1 %
Eosinophils Absolute: 0.3 10*3/uL (ref 0.0–0.5)
Eosinophils Relative: 3 %
HCT: 38.7 % — ABNORMAL LOW (ref 39.0–52.0)
Hemoglobin: 12.8 g/dL — ABNORMAL LOW (ref 13.0–17.0)
Immature Granulocytes: 1 %
Lymphocytes Relative: 29 %
Lymphs Abs: 3.1 10*3/uL (ref 0.7–4.0)
MCH: 29.4 pg (ref 26.0–34.0)
MCHC: 33.1 g/dL (ref 30.0–36.0)
MCV: 88.8 fL (ref 80.0–100.0)
Monocytes Absolute: 1 10*3/uL (ref 0.1–1.0)
Monocytes Relative: 9 %
Neutro Abs: 6.1 10*3/uL (ref 1.7–7.7)
Neutrophils Relative %: 57 %
Platelets: 322 10*3/uL (ref 150–400)
RBC: 4.36 MIL/uL (ref 4.22–5.81)
RDW: 14.6 % (ref 11.5–15.5)
WBC: 10.7 10*3/uL — ABNORMAL HIGH (ref 4.0–10.5)
nRBC: 0 % (ref 0.0–0.2)

## 2021-01-03 LAB — COMPREHENSIVE METABOLIC PANEL
ALT: 63 U/L — ABNORMAL HIGH (ref 0–44)
AST: 58 U/L — ABNORMAL HIGH (ref 15–41)
Albumin: 2.1 g/dL — ABNORMAL LOW (ref 3.5–5.0)
Alkaline Phosphatase: 92 U/L (ref 38–126)
Anion gap: 7 (ref 5–15)
BUN: 7 mg/dL (ref 6–20)
CO2: 27 mmol/L (ref 22–32)
Calcium: 7.7 mg/dL — ABNORMAL LOW (ref 8.9–10.3)
Chloride: 102 mmol/L (ref 98–111)
Creatinine, Ser: 0.47 mg/dL — ABNORMAL LOW (ref 0.61–1.24)
GFR, Estimated: >60 mL/min (ref >60)
Glucose, Bld: 108 mg/dL — ABNORMAL HIGH (ref 70–99)
Potassium: 3.5 mmol/L (ref 3.5–5.1)
Sodium: 136 mmol/L (ref 135–145)
Total Bilirubin: 0.8 mg/dL (ref 0.3–1.2)
Total Protein: 6.1 g/dL — ABNORMAL LOW (ref 6.5–8.1)

## 2021-01-03 LAB — MAGNESIUM: Magnesium: 1.9 mg/dL (ref 1.7–2.4)

## 2021-01-03 LAB — CULTURE, BLOOD (ROUTINE X 2): Special Requests: ADEQUATE

## 2021-01-03 MED ORDER — BUPRENORPHINE HCL-NALOXONE HCL 2-0.5 MG SL SUBL
1.0000 | SUBLINGUAL_TABLET | Freq: Every day | SUBLINGUAL | Status: DC
  Administered 2021-01-03 – 2021-01-04 (×2): 1 via SUBLINGUAL
  Filled 2021-01-03 (×2): qty 1

## 2021-01-03 MED ORDER — PANTOPRAZOLE SODIUM 40 MG PO TBEC
40.0000 mg | DELAYED_RELEASE_TABLET | Freq: Every evening | ORAL | Status: DC
  Administered 2021-01-03 – 2021-01-14 (×12): 40 mg via ORAL
  Filled 2021-01-03 (×12): qty 1

## 2021-01-03 MED ORDER — IBUPROFEN 600 MG PO TABS: 600.0000 mg | ORAL_TABLET | Freq: Three times a day (TID) | ORAL | Status: DC | PRN

## 2021-01-03 MED ORDER — IBUPROFEN 600 MG PO TABS
600.0000 mg | ORAL_TABLET | Freq: Three times a day (TID) | ORAL | Status: DC | PRN
  Administered 2021-01-03 – 2021-01-08 (×3): 600 mg via ORAL
  Filled 2021-01-03 (×3): qty 1

## 2021-01-03 NOTE — Progress Notes (Signed)
PROGRESS NOTE   George Lucas  ZOX:[id number] DOB: 1991-03-27 DOA: 12/28/2020 PCP: System, Provider Not In   Chief Complaint  Patient presents with  . Chest Pain   Level of care: Med-Surg  Brief Admission History:  30 y.o. male with medical history significant of nicotine dependence, IVDU presented to ED with a complaint of chest pain.  Currently patient, he woke up at 10 o'clock in the morning with the chest pain.  He then had fever of 104 at around 2:30 AM.  He also started having some dry cough.  He arrived to the ED at around 2 PM as he did not have a ride.  No other complaint.  He claims that he has been clean and has not used IVDU in about 2 months but continues to smoke.  ED Course: Upon arrival to ED, he was tachycardic and tachypneic and afebrile.  CBC showed white cells of 17.  BMP showed hyponatremia and hyperglycemia.  Chest x-ray unremarkable however CT angiogram of the chest showed multifocal pneumonia and septic emboli.  Patient received vancomycin in the ED as well as 1 L of IV fluid bolus.  Hospitalist service were consulted to admit the patient for further management for possible bacterial endocarditis.  Assessment & Plan:   Active Problems:   Multifocal pneumonia   Severe sepsis (HCC)   Septic embolism (HCC)   Acute hyponatremia   Bacteremia  Severe sepsis from MRSA bacteremia and multifocal pneumonia - Continue IV vancomcycin per ID recommendation, TEE did not show vegetations.   Persistent bacteremia - continue IV vancomycin, repeated BC 5/12 no growth to date.    Hyponatremia - improved.    Hypokalemia - repleted, Mg has been repleted.    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  ID recommending to keep in hospital for 2 weeks of IV therapy followed by long acting therapy.   He may need transfer to [facility] for angiovac procedure due to persistent bacteremia despite appropriate IV antibiotic.  Opioid Dependence - Pt having withdrawal symptoms.   Discussed with pharm D.  Increased suboxone to TID with noontime dose added.  Toradol has completed after today and ibuprofen ordered for breakthrough symptoms starting 5/15.     DVT prophylaxis: enoxaparin  Code Status: full  Family Communication: wife telephone update Disposition: anticipating at least 2 weeks in hospital for IV antibiotics Status is: Inpatient  Remains inpatient appropriate because:IV treatments appropriate due to intensity of illness or inability to take PO and Inpatient level of care appropriate due to severity of illness  Dispo: The patient is from: Home              Anticipated d/c is to: Home              Patient currently is not medically stable to d/c.   Difficult to place patient No  Consultants:   ID   Cardiology   Procedures:   Tentative TEE for 5/13   Antimicrobials:  Vancomycin  5/10>>  Subjective: Pt feeling terrible, sweating, febrile, chest discomfort with deep breaths.    Objective: Vitals:   01/02/21 1054 01/02/21 1406 01/02/21 2020 01/03/21 0424  BP: 100/70 112/69 106/70 124/78  Pulse: 68 76 95 82  Resp: Temp: 98.2 F (36.8 C) 98.6 F (37 C) 99 F (37.2 C) 99.1 F (37.3 C)  TempSrc: Oral Oral Oral Oral  SpO2: 100% 100% 98% 98%  Weight:      Height:  Intake/Output Summary (Last 24 hours) at 01/03/2021 0850 Last data filed at 01/02/2021 1818 Gross per 24 hour  Intake 1120 ml  Output --  Net 1120 ml   Filed Weights   12/28/20 1702  Weight: 81.6 kg    Examination:  General exam: diaphoretic male, awake, alert, Appears calm and NAD.   Respiratory system: Clear to auscultation. Respiratory effort normal. Cardiovascular system: normal S1 & S2 heard. No JVD, rubs, gallops or clicks. No pedal edema. Gastrointestinal system: Abdomen is nondistended, soft and nontender. No organomegaly or masses felt. Normal bowel sounds heard. Central nervous system: Alert and oriented. No focal neurological  deficits. Extremities: Symmetric 5 x 5 power. Skin: No rashes, lesions or ulcers Psychiatry: Judgement and insight appear poor. Mood & affect appropriate.   Data Reviewed: I have personally reviewed following labs and imaging studies  CBC: Recent Labs  Lab 12/28/20 1325 12/29/20 0410 12/30/20 0451 12/31/20 0252 01/01/21 0612 01/02/21 0507 01/03/21 0538  WBC 17.0*   < > 11.6* 10.2 9.8 11.0* 10.7*  NEUTROABS 14.8*  --   --   --  6.7 7.6 6.1  HGB 15.9   < > 13.4 13.2 12.6* 11.8* 12.8*  HCT 46.6   < > 39.8 39.3 36.9* 35.1* 38.7*  MCV 87.1   < > 88.2 88.1 86.4 87.1 88.8  PLT 161   < > 160 146* 171 238 322   < > = values in this interval not displayed.    Basic Metabolic Panel: Recent Labs  Lab 12/30/20 0451 12/31/20 0252 01/01/21 0612 01/02/21 0507 01/03/21 0538  NA 132* 131* 131* 135 136  K 3.3* 3.2* 3.0* 3.6 3.5  CL 99 99 101 102 102  CO2 GLUCOSE 115* 113* 158* 116* 108*  BUN CREATININE 0.57* 0.56* 0.62 0.56* 0.47*  CALCIUM 7.8* 7.4* 7.1* 7.2* 7.7*  MG 1.8 1.6* 2.0 1.9 1.9    GFR: Estimated Creatinine Clearance: 157.3 mL/min (A) (by C-G formula based on SCr of 0.47 mg/dL (L)).  Liver Function Tests: Recent Labs  Lab 12/28/20 1325 01/01/21 0612 01/02/21 0507 01/03/21 0538  AST 33 39 51* 58*  ALT 44 41 51* 63*  ALKPHOS 76 120 103 92  BILITOT 0.7 0.7 0.8 0.8  PROT 7.2 5.5* 5.7* 6.1*  ALBUMIN 3.5 2.1* 2.1* 2.1*    CBG: No results for input(s): GLUCAP in the last 168 hours.  Recent Results (from the past 240 hour(s))  Resp Panel by RT-PCR (Flu A&B, Covid) Nasopharyngeal Swab     Status: None   Collection Time: 12/28/20  2:29 PM   Specimen: Nasopharyngeal Swab; Nasopharyngeal(NP) swabs in vial transport medium  Result Value Ref Range Status   SARS Coronavirus 2 by RT PCR NEGATIVE NEGATIVE Final    Comment: (NOTE) SARS-CoV-2 target nucleic acids are NOT DETECTED.  The SARS-CoV-2 RNA is generally detectable in upper  respiratory specimens during the acute phase of infection. The lowest concentration of SARS-CoV-2 viral copies this assay can detect is 138 copies/mL. A negative result does not preclude SARS-Cov-2 infection and should not be used as the sole basis for treatment or other patient management decisions. A negative result may occur with  improper specimen collection/handling, submission of specimen other than nasopharyngeal swab, presence of viral mutation(s) within the areas targeted by this assay, and inadequate number of viral copies(<138 copies/mL). A negative result must be combined with clinical observations, patient history, and epidemiological information. The expected result  is Negative.  Fact Sheet for Patients:  BloggerCourse.com  Fact Sheet for Healthcare Providers:  SeriousBroker.it  This test is no t yet approved or cleared by the [place] FDA and  has been authorized for detection and/or diagnosis of SARS-CoV-2 by FDA under an Emergency Use Authorization (EUA). This EUA will remain  in effect (meaning this test can be used) for the duration of the COVID-19 declaration under Section 564(b)(1) of the Act, 21 U.S.C.section 360bbb-3(b)(1), unless the authorization is terminated  or revoked sooner.       Influenza A by PCR NEGATIVE NEGATIVE Final   Influenza B by PCR NEGATIVE NEGATIVE Final    Comment: (NOTE) The Xpert Xpress SARS-CoV-2/FLU/RSV plus assay is intended as an aid in the diagnosis of influenza from Nasopharyngeal swab specimens and should not be used as a sole basis for treatment. Nasal washings and aspirates are unacceptable for Xpert Xpress SARS-CoV-2/FLU/RSV testing.  Fact Sheet for Patients: BloggerCourse.com  Fact Sheet for Healthcare Providers: SeriousBroker.it  This test is not yet approved or cleared by the [place] FDA and has been  authorized for detection and/or diagnosis of SARS-CoV-2 by FDA under an Emergency Use Authorization (EUA). This EUA will remain in effect (meaning this test can be used) for the duration of the COVID-19 declaration under Section 564(b)(1) of the Act, 21 U.S.C. section 360bbb-3(b)(1), unless the authorization is terminated or revoked.  Performed at [facility], [address] [place], [place] [address]   Blood culture (routine x 2)     Status: Abnormal   Collection Time: 12/28/20  5:21 PM   Specimen: BLOOD RIGHT HAND  Result Value Ref Range Status   Specimen Description   Final    BLOOD RIGHT HAND Performed at [facility], [address], [place], [place] [address]    Special Requests   Final    BOTTLES DRAWN AEROBIC AND ANAEROBIC Blood Culture adequate volume Performed at [facility], [address] [place], [place] [address]    Culture  Setup Time   Final    GRAM POSITIVE COCCI IN BOTH AEROBIC AND ANAEROBIC BOTTLES Gram Stain Report Called to,Read Back By and Verified With: HOWARDTON,M@0727  BY MATTHEWS, B 5.9.22 CRITICAL RESULT CALLED TO, READ BACK BY AND VERIFIED WITH: RN Denman George AT 1813 12/29/2020 BY L BENFIELD Performed at [facility], [address], [place], [place] [address]    Culture METHICILLIN RESISTANT STAPHYLOCOCCUS AUREUS (A)  Final   Report Status 12/31/2020 FINAL  Final   Organism ID, Bacteria METHICILLIN RESISTANT STAPHYLOCOCCUS AUREUS  Final      Susceptibility   Methicillin resistant staphylococcus aureus - MIC*    CIPROFLOXACIN <=0.5 SENSITIVE Sensitive     ERYTHROMYCIN >=8 RESISTANT Resistant     GENTAMICIN <=0.5 SENSITIVE Sensitive     OXACILLIN >=4 RESISTANT Resistant     TETRACYCLINE <=1 SENSITIVE Sensitive     VANCOMYCIN <=0.5 SENSITIVE Sensitive     TRIMETH/SULFA <=10 SENSITIVE Sensitive     CLINDAMYCIN <=0.25 SENSITIVE Sensitive     RIFAMPIN <=0.5 SENSITIVE Sensitive     Inducible Clindamycin NEGATIVE Sensitive     * METHICILLIN  RESISTANT STAPHYLOCOCCUS AUREUS  Blood culture (routine x 2)     Status: Abnormal   Collection Time: 12/28/20  5:21 PM   Specimen: BLOOD LEFT HAND  Result Value Ref Range Status   Specimen Description   Final    BLOOD LEFT HAND Performed at [facility], [address], [place], [place] [address]  Special Requests   Final    BOTTLES DRAWN AEROBIC AND ANAEROBIC Blood Culture adequate volume Performed at [facility], [address] [place], [place] [address]    Culture  Setup Time   Final    GRAM POSITIVE COCCI ANAEROBIC AND AEROBIC BOTTLES Gram Stain Report Called to,Read Back By and Verified With: HOWARDTON,M@0727  BY MATTHEWS, B 5.9.22 Performed at [facility], [address], [place], [place] [address]    Culture (A)  Final    STAPHYLOCOCCUS AUREUS SUSCEPTIBILITIES PERFORMED ON PREVIOUS CULTURE WITHIN THE LAST 5 DAYS. Performed at [facility], [address], [place], [place] [address]    Report Status 12/31/2020 FINAL  Final  Blood Culture ID Panel (Reflexed)     Status: Abnormal   Collection Time: 12/28/20  5:21 PM  Result Value Ref Range Status   Enterococcus faecalis NOT DETECTED NOT DETECTED Final   Enterococcus Faecium NOT DETECTED NOT DETECTED Final   Listeria monocytogenes NOT DETECTED NOT DETECTED Final   Staphylococcus species DETECTED (A) NOT DETECTED Final    Comment: CRITICAL RESULT CALLED TO, READ BACK BY AND VERIFIED WITH: RN M HOWERTON AT 1813 12/29/2020 BY L BENFIELD    Staphylococcus aureus (BCID) DETECTED (A) NOT DETECTED Final    Comment: Methicillin (oxacillin)-resistant Staphylococcus aureus (MRSA). MRSA is predictably resistant to beta-lactam antibiotics (except ceftaroline). Preferred therapy is vancomycin unless clinically contraindicated. Patient requires contact precautions if  hospitalized. CRITICAL RESULT CALLED TO, READ BACK BY AND VERIFIED WITH: RN Denman George AT 1813 12/29/2020 BY L BENFIELD    Staphylococcus epidermidis NOT DETECTED NOT  DETECTED Final   Staphylococcus lugdunensis NOT DETECTED NOT DETECTED Final   Streptococcus species NOT DETECTED NOT DETECTED Final   Streptococcus agalactiae NOT DETECTED NOT DETECTED Final   Streptococcus pneumoniae NOT DETECTED NOT DETECTED Final   Streptococcus pyogenes NOT DETECTED NOT DETECTED Final   A.calcoaceticus-baumannii NOT DETECTED NOT DETECTED Final   Bacteroides fragilis NOT DETECTED NOT DETECTED Final   Enterobacterales NOT DETECTED NOT DETECTED Final   Enterobacter cloacae complex NOT DETECTED NOT DETECTED Final   Escherichia coli NOT DETECTED NOT DETECTED Final   Klebsiella aerogenes NOT DETECTED NOT DETECTED Final   Klebsiella oxytoca NOT DETECTED NOT DETECTED Final   Klebsiella pneumoniae NOT DETECTED NOT DETECTED Final   Proteus species NOT DETECTED NOT DETECTED Final   Salmonella species NOT DETECTED NOT DETECTED Final   Serratia marcescens NOT DETECTED NOT DETECTED Final   Haemophilus influenzae NOT DETECTED NOT DETECTED Final   Neisseria meningitidis NOT DETECTED NOT DETECTED Final   Pseudomonas aeruginosa NOT DETECTED NOT DETECTED Final   Stenotrophomonas maltophilia NOT DETECTED NOT DETECTED Final   Candida albicans NOT DETECTED NOT DETECTED Final   Candida auris NOT DETECTED NOT DETECTED Final   Candida glabrata NOT DETECTED NOT DETECTED Final   Candida krusei NOT DETECTED NOT DETECTED Final   Candida parapsilosis NOT DETECTED NOT DETECTED Final   Candida tropicalis NOT DETECTED NOT DETECTED Final   Cryptococcus neoformans/gattii NOT DETECTED NOT DETECTED Final   Meth resistant mecA/C and MREJ DETECTED (A) NOT DETECTED Final    Comment: CRITICAL RESULT CALLED TO, READ BACK BY AND VERIFIED WITH: RN Denman George AT 1813 12/29/2020 BY L BENFIELD Performed at [facility], [address], [place], [place] [address]   Respiratory (~20 pathogens) panel by PCR     Status: None   Collection Time: 12/29/20  7:30 AM   Specimen: Nasopharyngeal Swab; Respiratory   Result Value Ref Range  Status   Adenovirus NOT DETECTED NOT DETECTED Final   Coronavirus 229E NOT DETECTED NOT DETECTED Final    Comment: (NOTE) The Coronavirus on the Respiratory Panel, DOES NOT test for the novel  Coronavirus (2019 nCoV)    Coronavirus HKU1 NOT DETECTED NOT DETECTED Final   Coronavirus NL63 NOT DETECTED NOT DETECTED Final   Coronavirus OC43 NOT DETECTED NOT DETECTED Final   Metapneumovirus NOT DETECTED NOT DETECTED Final   Rhinovirus / Enterovirus NOT DETECTED NOT DETECTED Final   Influenza A NOT DETECTED NOT DETECTED Final   Influenza B NOT DETECTED NOT DETECTED Final   Parainfluenza Virus 1 NOT DETECTED NOT DETECTED Final   Parainfluenza Virus 2 NOT DETECTED NOT DETECTED Final   Parainfluenza Virus 3 NOT DETECTED NOT DETECTED Final   Parainfluenza Virus 4 NOT DETECTED NOT DETECTED Final   Respiratory Syncytial Virus NOT DETECTED NOT DETECTED Final   Bordetella pertussis NOT DETECTED NOT DETECTED Final   Bordetella Parapertussis NOT DETECTED NOT DETECTED Final   Chlamydophila pneumoniae NOT DETECTED NOT DETECTED Final   Mycoplasma pneumoniae NOT DETECTED NOT DETECTED Final    Comment: Performed at [facility], [address] [place], [place] [address]  MRSA PCR Screening     Status: Abnormal   Collection Time: 12/29/20  7:32 AM   Specimen: Nasal Mucosa; Nasopharyngeal  Result Value Ref Range Status   MRSA by PCR POSITIVE (A) NEGATIVE Final    Comment:        The GeneXpert MRSA Assay (FDA approved for NASAL specimens only), is one component of a comprehensive MRSA colonization surveillance program. It is not intended to diagnose MRSA infection nor to guide or monitor treatment for MRSA infections. RESULT CALLED TO, READ BACK BY AND VERIFIED WITH: HOWERTON M. AT 1018AM ON [id number] BY THOMPSON S. Performed at [facility], [address] [place], [place] [address]   Culture, blood (Routine X 2) w Reflex to ID Panel     Status: Abnormal   Collection  Time: 12/29/20  8:35 PM   Specimen: BLOOD RIGHT HAND  Result Value Ref Range Status   Specimen Description   Final    BLOOD RIGHT HAND Performed at [facility], [address] [place], [place] [address]    Special Requests   Final    BOTTLES DRAWN AEROBIC AND ANAEROBIC Blood Culture results may not be optimal due to an inadequate volume of blood received in culture bottles Performed at [facility], [address] [place], [place] [address]    Culture  Setup Time   Final    ANAEROBIC BOTTLE ONLY GRAM POSITIVE COCCI Gram Stain Report Called to,Read Back By and Verified With: RESULTS PREVIOUSLY CALLED RN MARY ANN HAWLERTON @1324  12/30/20 BY JONES,T [facility] Performed at [facility], [address] [place], [place] [address]    Culture (A)  Final    STAPHYLOCOCCUS AUREUS SUSCEPTIBILITIES PERFORMED ON PREVIOUS CULTURE WITHIN THE LAST 5 DAYS. Performed at [facility], [address] [place], [place] [address]    Report Status 01/01/2021 FINAL  Final  Culture, blood (Routine X 2) w Reflex to ID Panel     Status: Abnormal   Collection Time: 12/29/20  8:36 PM   Specimen: BLOOD  Result Value Ref Range Status   Specimen Description   Final    BLOOD RIGHT ANTECUBITAL Performed at [facility], [address] [place], [place] [address]    Special Requests   Final    BOTTLES DRAWN AEROBIC AND  ANAEROBIC Blood Culture results may not be optimal due to an inadequate volume of blood received in culture bottles Performed at [facility], [address], [place], [place] [address]    Culture  Setup Time   Final    IN BOTH AEROBIC AND ANAEROBIC BOTTLES GRAM POSITIVE COCCI Gram Stain Report Called to,Read Back By and Verified With: MARY ANN HAWLERTON, RN @1354  12/30/20 BY JONES,T [facility] CRITICAL VALUE NOTED.  VALUE IS CONSISTENT WITH PREVIOUSLY REPORTED AND CALLED VALUE.    Culture (A)  Final    STAPHYLOCOCCUS AUREUS SUSCEPTIBILITIES PERFORMED ON PREVIOUS CULTURE WITHIN THE LAST 5  DAYS. Performed at [facility], [address], [place], [place]    Report Status 01/01/2021 FINAL  Final  Culture, blood (routine x 2)     Status: Abnormal (Preliminary result)   Collection Time: 12/30/20  4:20 PM   Specimen: BLOOD RIGHT ARM  Result Value Ref Range Status   Specimen Description   Final    BLOOD RIGHT ARM Performed at [facility], [address], [place], [place]    Special Requests   Final    BOTTLES DRAWN AEROBIC AND ANAEROBIC Blood Culture adequate volume Performed at [facility], [address] [place], [place]    Culture  Setup Time   Final    ANAEROBIC BOTTLE ONLY GRAM POSITIVE COCCI Gram Stain Report Called to,Read Back By and Verified With: T HAIRSTON,RN@0307  01/01/21 MKELLY CRITICAL VALUE NOTED.  VALUE IS CONSISTENT WITH PREVIOUSLY REPORTED AND CALLED VALUE. AEROBIC BOTTLE  GRAM POSITIVE COCCI PREVIOUSLY CALLED Performed at [facility], [address] [place], [place]    Culture (A)  Final    STAPHYLOCOCCUS AUREUS SUSCEPTIBILITIES PERFORMED ON PREVIOUS CULTURE WITHIN THE LAST 5 DAYS. Performed at [facility], [address] [place], [place]    Report Status PENDING  Incomplete  Culture, blood (routine x 2)     Status: Abnormal (Preliminary result)   Collection Time: 12/30/20  4:20 PM   Specimen: BLOOD LEFT ARM  Result Value Ref Range Status   Specimen Description   Final    BLOOD LEFT ARM Performed at [facility], [address] [place], [place]    Special Requests   Final    BOTTLES DRAWN AEROBIC AND ANAEROBIC Blood Culture adequate volume Performed at [facility], [address], [place], [place]    Culture  Setup Time   Final    GRAM POSITIVE COCCI ANAEROBIC BOTTLE Gram Stain Report Called to,Read Back By and Verified With: P BENGTSON 01/01/21 @ 0857 BY 03/03/21 Performed at [facility], [address], [place], [place]    Culture (A)  Final     STAPHYLOCOCCUS AUREUS SUSCEPTIBILITIES PERFORMED ON PREVIOUS CULTURE WITHIN THE LAST 5 DAYS. Performed at [facility], [address], [place], [place]    Report Status PENDING  Incomplete  Culture, blood (Routine X 2) w Reflex to ID Panel     Status: None (Preliminary result)   Collection Time: 01/01/21  6:41 PM   Specimen: BLOOD LEFT ARM  Result Value Ref Range Status   Specimen Description BLOOD LEFT ARM  Final   Special Requests   Final    BOTTLES DRAWN AEROBIC AND ANAEROBIC Blood Culture results may not be optimal due to an excessive volume of blood received in culture bottles   Culture   Final    NO GROWTH 2 DAYS  Performed at [facility], [address] [place], [place] [address]    Report Status PENDING  Incomplete  Culture, blood (Routine X 2) w Reflex to ID Panel     Status: None (Preliminary result)   Collection Time: 01/01/21  6:41 PM   Specimen: BLOOD RIGHT ARM  Result Value Ref Range Status   Specimen Description BLOOD RIGHT ARM  Final   Special Requests   Final    BOTTLES DRAWN AEROBIC ONLY Blood Culture results may not be optimal due to an inadequate volume of blood received in culture bottles   Culture   Final    NO GROWTH 2 DAYS Performed at [facility], [address] [place], [place] [address]    Report Status PENDING  Incomplete     Radiology Studies: ECHO TEE  Result Date: 01/02/2021    TRANSESOPHOGEAL ECHO REPORT   Patient Name:   George Lucas [facility] Date of Exam: 01/02/2021 Medical Rec #:  [id number]         Height:       74.0 in Accession #:    [id number]        Weight:       180.0 lb Date of Birth:  1991/03/23          BSA:          2.078 m Patient Age:    30 years          BP:           111/63 mmHg Patient Gender: M                 HR:           78 bpm. [facility] Procedure: Transesophageal Echo Indications:    Bacteremia  History:        Patient has prior history of Echocardiogram examinations, most                 recent  12/29/2020. Risk Factors:Current Smoker. IVDU, Multifocal                 Pneumonia, Sepsis, Septic Embolism.  Sonographer:    Jeryl Columbia RDCS (AE) Referring Phys: [id number] Ellsworth Lennox PROCEDURE: After discussion of the risks and benefits of a TEE, an informed consent was obtained from the patient. TEE procedure time was 8 minutes. The transesophogeal probe was passed without difficulty through the esophogus of the patient. Imaged were  obtained with the patient in a left lateral decubitus position. Local oropharyngeal anesthetic was provided with viscous lidocaine. Sedation performed by different physician. The patient was monitored while under deep sedation. Anesthestetic sedation was provided intravenously by Anesthesiology:  of Propofol. Image quality was good. The patient's vital signs; including heart rate, blood pressure, and oxygen saturation; remained stable throughout the procedure. The patient developed no complications during the procedure. IMPRESSIONS  1. Left ventricular ejection fraction, by estimation, is 60 to 65%. The left ventricle has normal function.  2. Right ventricular systolic function is normal. The right ventricular size is normal.  3. No left atrial/left atrial appendage thrombus was detected.  4. The mitral valve is grossly normal. Trivial mitral valve regurgitation.  5. The aortic valve is tricuspid. Aortic valve regurgitation is not visualized.  6. Descending aorta without significant atherosclerosis.  7. No valvular vegetations. FINDINGS  Left Ventricle: Left ventricular ejection fraction, by estimation, is 60 to 65%. The left ventricle has normal function. The left ventricular internal cavity size was  normal in size. Right Ventricle: The right ventricular size is normal. Right vetricular wall thickness was not assessed. Right ventricular systolic function is normal. Left Atrium: Left atrial size was normal in size. No left atrial/left atrial appendage thrombus was  detected. Right Atrium: Right atrial size was normal in size. Pericardium: There is no evidence of pericardial effusion. Mitral Valve: The mitral valve is grossly normal. Trivial mitral valve regurgitation. Tricuspid Valve: The tricuspid valve is grossly normal. Tricuspid valve regurgitation is trivial. Aortic Valve: The aortic valve is tricuspid. Aortic valve regurgitation is not visualized. Pulmonic Valve: The pulmonic valve was grossly normal. Pulmonic valve regurgitation is trivial. Aorta: Descending aorta without significant atherosclerosis. The aortic root is normal in size and structure. IAS/Shunts: No atrial level shunt detected by color flow Doppler. Nona Dell MD Electronically signed by Nona Dell MD Signature Date/Time: 01/02/2021/10:41:05 AM    Final    Scheduled Meds: . buprenorphine-naloxone  1 tablet Sublingual Q1200  . buprenorphine-naloxone  1 tablet Sublingual BID  . cholecalciferol  2,000 Units Oral Daily  . enoxaparin (LOVENOX) injection  40 mg Subcutaneous Q24H  . nicotine  21 mg Transdermal Daily  . pantoprazole  40 mg Oral QPM  . polyethylene glycol  17 g Oral Daily  . senna-docusate  2 tablet Oral QHS   Continuous Infusions: . vancomycin 1,000 mg (01/03/21 0542)    LOS: 6 days   Time spent: 35 mins   Versa Craton Laural Benes, MD How to contact the [facility] Attending or Consulting provider 7A - 7P or covering provider during after hours 7P -7A, for this patient?  1. Check the care team in [facility] and look for a) attending/consulting [facility] provider listed and b) the [facility] team listed 2. Log into [email or web address] and use [place]'s universal password to access. If you do not have the password, please contact the hospital operator. 3. Locate the [facility] provider you are looking for under [facility] and page to a number that you can be directly reached. 4. If you still have difficulty reaching the provider, please page the [facility] (Director on Call) for the Hospitalists listed on  amion for assistance.  01/03/2021, 8:50 AM

## 2021-01-03 NOTE — Progress Notes (Signed)
      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  He had low quality TTE   TEE done yesterday is without vegetations--I presume because he embolized all of what he might have had to the lungs  Repeat blood cultures taken 5/10/2022are STILL growing MRSA, 01/01/2021 no growth so far.  Hopefully he is finally clearing his blood cultures  For that he stay in the hospital for a good portion of his therapy and then to be given intravenously in the hospital.    HCV + ab: Back RNA and genotype would be happy to see him in the clinic and treat his hepatitis C.  I will continue to be on call this weekend and I am available for questions.   I am also happy to see him in our clinic for followup or arrange this with one of my partners.  [name] 01/03/2021, 7:32 PM

## 2021-01-04 DIAGNOSIS — E871 Hypo-osmolality and hyponatremia: Secondary | ICD-10-CM | POA: Diagnosis not present

## 2021-01-04 DIAGNOSIS — J189 Pneumonia, unspecified organism: Secondary | ICD-10-CM | POA: Diagnosis not present

## 2021-01-04 DIAGNOSIS — A419 Sepsis, unspecified organism: Secondary | ICD-10-CM | POA: Diagnosis not present

## 2021-01-04 DIAGNOSIS — I76 Septic arterial embolism: Secondary | ICD-10-CM | POA: Diagnosis not present

## 2021-01-04 LAB — CREATININE, SERUM
Creatinine, Ser: 0.47 mg/dL — ABNORMAL LOW (ref 0.61–1.24)
GFR, Estimated: >60 mL/min (ref >60)

## 2021-01-04 LAB — CULTURE, BLOOD (ROUTINE X 2): Special Requests: ADEQUATE

## 2021-01-04 LAB — GLUCOSE, CAPILLARY: Glucose-Capillary: 120 mg/dL — ABNORMAL HIGH (ref 70–99)

## 2021-01-04 MED ORDER — ALUM & MAG HYDROXIDE-SIMETH 200-200-20 MG/5ML PO SUSP
30.0000 mL | Freq: Once | ORAL | Status: AC
  Administered 2021-01-04: 30 mL via ORAL
  Filled 2021-01-04: qty 30

## 2021-01-04 MED ORDER — KETOROLAC TROMETHAMINE 30 MG/ML IJ SOLN
30.0000 mg | Freq: Once | INTRAMUSCULAR | Status: AC
  Administered 2021-01-04: 30 mg via INTRAMUSCULAR
  Filled 2021-01-04: qty 1

## 2021-01-04 MED ORDER — KETOROLAC TROMETHAMINE 30 MG/ML IJ SOLN: 30.0000 mg | Freq: Once | INTRAMUSCULAR | Status: DC

## 2021-01-04 MED ORDER — NICOTINE 21 MG/24HR TD PT24
21.0000 mg | MEDICATED_PATCH | Freq: Every day | TRANSDERMAL | Status: DC
  Administered 2021-01-04 – 2021-01-15 (×12): 21 mg via TRANSDERMAL
  Filled 2021-01-04 (×12): qty 1

## 2021-01-04 MED ORDER — BUPRENORPHINE HCL-NALOXONE HCL 2-0.5 MG SL SUBL
2.0000 | SUBLINGUAL_TABLET | Freq: Every day | SUBLINGUAL | Status: DC
  Administered 2021-01-05 – 2021-01-14 (×10): 2 via SUBLINGUAL
  Filled 2021-01-04 (×2): qty 2
  Filled 2021-01-04 (×2): qty 1
  Filled 2021-01-04 (×3): qty 2
  Filled 2021-01-04: qty 1
  Filled 2021-01-04 (×3): qty 2

## 2021-01-04 NOTE — Progress Notes (Signed)
      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  He had low quality TTE   TEE done  without vegetations--I presume because he embolized all of what he might have had to the lungs  Repeat blood cultures taken 5/10/2022are STILL growing MRSA, 01/01/2021 no growth so far at 2 days  Would prefer that he stay in the hospital for a good portion of his therapy and then to be given intravenously in the hospital.  I completely do not believe his claims that he has been free of intravenous drugs for 2 months   HCV + ab: Back RNA and genotype would be happy to see him in the clinic and treat his hepatitis C.    I am also happy to see him in our clinic for followup  Paulette Blanch Dam 01/04/2021, 3:15 PM

## 2021-01-04 NOTE — Progress Notes (Signed)
Pharmacy Antibiotic Note  George Lucas is a 30 y.o. 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  Pharmacy has been consulted for vancomycin dosing. Vancomycin  Levels: PK 29, Tr 9.  Ke= 0.22 Hr t 1/2 = 3.2hr AUC 460, therapeutic    Plan: Vancomycin 1000mg  IV every 8 hours F/U cxs and clinical progress Monitor V/S, labs and levels as indicated  Height: 6\' 2"  (188 cm) Weight: 81.6 kg (180 lb) IBW/kg (Calculated) : 82.2  Temp (24hrs), Avg:98.8 F (37.1 C), Min:98.1 F (36.7 C), Max:100 F (37.8 C)  Recent Labs  Lab 12/28/20 1720 12/28/20 1841 12/28/20 2204 12/29/20 0410 12/30/20 0451 12/31/20 0252 12/31/20 0815 01/01/21 0612 01/02/21 0507 01/03/21 0538 01/04/21 0410  WBC  --   --   --  12.6* 11.6* 10.2  --  9.8 11.0* 10.7*  --   CREATININE  --   --   --  0.62 0.57* 0.56*  --  0.62 0.56* 0.47* 0.47*  LATICACIDVEN 2.5* 3.5* 2.2* 1.5  --   --   --   --   --   --   --   VANCOTROUGH  --   --   --   --   --   --  9*  --   --   --   --   VANCOPEAK  --   --   --   --   --  29*  --   --   --   --   --     Estimated Creatinine Clearance: 157.3 mL/min (A) (by C-G formula based on SCr of 0.47 mg/dL (L)).    Allergies  Allergen Reactions  . Sulfa Antibiotics Anaphylaxis and Rash    Antimicrobials this admission: 5/8 vancomycin >>  5/8 cefepime >> 5/9  Microbiology results: 5/8 BCx: MRSA  5/9 MRSA PCR positive 5/9 BCX: MRSA 5/10 BCX: pending  Thank you for allowing pharmacy to be a part of this patient's care.  7/9, PharmD, MBA, BCGP Clinical Pharmacist  01/04/2021 8:51 AM

## 2021-01-04 NOTE — Progress Notes (Addendum)
PROGRESS NOTE   George Lucas  FOY:[id number] DOB: [birth year] DOA: 12/28/2020 PCP: System, Provider Not In   Chief Complaint  Patient presents with  . Chest Pain   Level of care: Med-Surg  Brief Admission History:  30 y.o. male with medical history significant of nicotine dependence, IVDU presented to ED with a complaint of chest pain.  Currently patient, he woke up at 10 o'clock in the morning with the chest pain.  He then had fever of 104 at around 2:30 AM.  He also started having some dry cough.  He arrived to the ED at around 2 PM as he did not have a ride.  No other complaint.  He claims that he has been clean and has not used IVDU in about 2 months but continues to smoke.   ED Course: Upon arrival to ED, he was tachycardic and tachypneic and afebrile.  CBC showed white cells of 17.  BMP showed hyponatremia and hyperglycemia.  Chest x-ray unremarkable however CT angiogram of the chest showed multifocal pneumonia and septic emboli.  Patient received vancomycin in the ED as well as 1 L of IV fluid bolus.  Hospitalist service were consulted to admit the patient for further management for possible bacterial endocarditis.  Assessment & Plan:   Active Problems:   Multifocal pneumonia   Severe sepsis (HCC)   Septic embolism (HCC)   Acute hyponatremia   Bacteremia  Severe sepsis from MRSA bacteremia and multifocal pneumonia - Continue IV vancomcycin per ID recommendation, TEE did not show vegetations, pt continues to be afebrile but last WBC elevated to 10.7.  Persistent bacteremia - continue IV vancomycin, repeated BC 5/12 no growth to date.    Hyponatremia - improved, will recheck in AM   Hypokalemia - repleted, Mg has been repleted.    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  ID recommending to keep in hospital for 2 weeks of IV therapy followed by long acting therapy.     Mild R chest pain during inspiration - CXR w/ some bibasilar fluid, not concerning.  Encouraged pt to ambulate and use inspiratory spirometer. Pt denies coughs or other concerning ssx at this time.  HCV - HCV quant w/ reflex genotype in progress. ID following.  Opioid Dependence - Pt having withdrawal symptoms.  Discussed with pharm D.  Increased suboxone to TID with noontime dose added on 5/15. Plan to increase noontime dose on 5/16.  Toradol has completed after today and ibuprofen ordered for breakthrough symptoms starting 5/15. Informed pt by telephone     Opioid induced constipation - Pt reported having a BM 2 days ago, he has been refusing to take the laxatives offered.    DVT prophylaxis: enoxaparin  Code Status: full  Family Communication: wife telephone update Disposition: anticipating at least 2 weeks in hospital for IV antibiotics Status is: Inpatient  Remains inpatient appropriate because:IV treatments appropriate due to intensity of illness or inability to take PO and Inpatient level of care appropriate due to severity of illness  Dispo: The patient is from: Home              Anticipated d/c is to: Home              Patient currently is not medically stable to d/c.   Difficult to place patient No  Consultants:   ID   Cardiology   Procedures:   TEE for 5/13, no abnormalities or vegetations noted  Antimicrobials:  Vancomycin  5/10>>  Subjective: Pt feels much better today, still afebrile. Some inspiratory pain on R side. Advised to ambulate and use resp spirometer.  Objective: Vitals:   01/03/21 1057 01/03/21 1322 01/03/21 2054 01/04/21 0513  BP: (!) 141/86 124/74 119/79 130/86  Pulse: 98 89 84 81  Resp:  Temp: 100 F (37.8 C) 98.5 F (36.9 C) 98.7 F (37.1 C) 98.1 F (36.7 C)  TempSrc: Oral Oral Oral Oral  SpO2: 98% 97% 98% 98%  Weight:      Height:        Intake/Output Summary (Last 24 hours) at 01/04/2021 1007 Last data filed at 01/04/2021 0859 Gross per 24 hour  Intake 1680 ml  Output --  Net 1680 ml   Filed  Weights   12/28/20 1702  Weight: 81.6 kg    Examination:  General exam: diaphoretic male, awake, alert, Appears calm and NAD.   Respiratory system: Clear to auscultation. Respiratory effort normal. Cardiovascular system: normal S1 & S2 heard. No JVD, rubs, gallops or clicks. No pedal edema. Gastrointestinal system: Abdomen is nondistended, soft and nontender. No organomegaly or masses felt. Normal bowel sounds heard. Central nervous system: Alert and oriented. No focal neurological deficits. Extremities: Symmetric 5 x 5 power. Skin: No rashes, lesions or ulcers Psychiatry: Judgement and insight appear poor. Mood & affect appropriate.   Data Reviewed: I have personally reviewed following labs and imaging studies  CBC: Recent Labs  Lab 12/28/20 1325 12/29/20 0410 12/30/20 0451 12/31/20 0252 01/01/21 0612 01/02/21 0507 01/03/21 0538  WBC 17.0*   < > 11.6* 10.2 9.8 11.0* 10.7*  NEUTROABS 14.8*  --   --   --  6.7 7.6 6.1  HGB 15.9   < > 13.4 13.2 12.6* 11.8* 12.8*  HCT 46.6   < > 39.8 39.3 36.9* 35.1* 38.7*  MCV 87.1   < > 88.2 88.1 86.4 87.1 88.8  PLT 161   < > 160 146* 171 238 322   < > = values in this interval not displayed.    Basic Metabolic Panel: Recent Labs  Lab 12/30/20 0451 12/31/20 0252 01/01/21 0612 01/02/21 0507 01/03/21 0538 01/04/21 0410  NA 132* 131* 131* 135 136  --   K 3.3* 3.2* 3.0* 3.6 3.5  --   CL 99 99 101 102 102  --   CO2 --   GLUCOSE 115* 113* 158* 116* 108*  --   BUN --   CREATININE 0.57* 0.56* 0.62 0.56* 0.47* 0.47*  CALCIUM 7.8* 7.4* 7.1* 7.2* 7.7*  --   MG 1.8 1.6* 2.0 1.9 1.9  --     GFR: Estimated Creatinine Clearance: 157.3 mL/min (A) (by C-G formula based on SCr of 0.47 mg/dL (L)).  Liver Function Tests: Recent Labs  Lab 12/28/20 1325 01/01/21 0612 01/02/21 0507 01/03/21 0538  AST 33 39 51* 58*  ALT 44 41 51* 63*  ALKPHOS 76 120 103 92  BILITOT 0.7 0.7 0.8 0.8  PROT 7.2 5.5* 5.7* 6.1*   ALBUMIN 3.5 2.1* 2.1* 2.1*    CBG: Recent Labs  Lab 01/04/21 0758  GLUCAP 120*    Recent Results (from the past 240 hour(s))  Resp Panel by RT-PCR (Flu A&B, Covid) Nasopharyngeal Swab     Status: None   Collection Time: 12/28/20  2:29 PM   Specimen: Nasopharyngeal Swab; Nasopharyngeal(NP) swabs in vial transport medium  Result Value Ref Range Status   SARS Coronavirus  2 by RT PCR NEGATIVE NEGATIVE Final    Comment: (NOTE) SARS-CoV-2 target nucleic acids are NOT DETECTED.  The SARS-CoV-2 RNA is generally detectable in upper respiratory specimens during the acute phase of infection. The lowest concentration of SARS-CoV-2 viral copies this assay can detect is 138 copies/mL. A negative result does not preclude SARS-Cov-2 infection and should not be used as the sole basis for treatment or other patient management decisions. A negative result may occur with  improper specimen collection/handling, submission of specimen other than nasopharyngeal swab, presence of viral mutation(s) within the areas targeted by this assay, and inadequate number of viral copies(<138 copies/mL). A negative result must be combined with clinical observations, patient history, and epidemiological information. The expected result is Negative.  Fact Sheet for Patients:  BloggerCourse.com  Fact Sheet for Healthcare Providers:  SeriousBroker.it  This test is no t yet approved or cleared by the [place] FDA and  has been authorized for detection and/or diagnosis of SARS-CoV-2 by FDA under an Emergency Use Authorization (EUA). This EUA will remain  in effect (meaning this test can be used) for the duration of the COVID-19 declaration under Section 564(b)(1) of the Act, 21 U.S.C.section 360bbb-3(b)(1), unless the authorization is terminated  or revoked sooner.       Influenza A by PCR NEGATIVE NEGATIVE Final   Influenza B by PCR NEGATIVE NEGATIVE  Final    Comment: (NOTE) The Xpert Xpress SARS-CoV-2/FLU/RSV plus assay is intended as an aid in the diagnosis of influenza from Nasopharyngeal swab specimens and should not be used as a sole basis for treatment. Nasal washings and aspirates are unacceptable for Xpert Xpress SARS-CoV-2/FLU/RSV testing.  Fact Sheet for Patients: BloggerCourse.com  Fact Sheet for Healthcare Providers: SeriousBroker.it  This test is not yet approved or cleared by the [place] FDA and has been authorized for detection and/or diagnosis of SARS-CoV-2 by FDA under an Emergency Use Authorization (EUA). This EUA will remain in effect (meaning this test can be used) for the duration of the COVID-19 declaration under Section 564(b)(1) of the Act, 21 U.S.C. section 360bbb-3(b)(1), unless the authorization is terminated or revoked.  Performed at [facility], [address], [place], [place] [address]   Blood culture (routine x 2)     Status: Abnormal   Collection Time: 12/28/20  5:21 PM   Specimen: BLOOD RIGHT HAND  Result Value Ref Range Status   Specimen Description   Final    BLOOD RIGHT HAND Performed at [facility], [address], [place], [place] [address]    Special Requests   Final    BOTTLES DRAWN AEROBIC AND ANAEROBIC Blood Culture adequate volume Performed at [facility], [address] [place], [place] [address]    Culture  Setup Time   Final    GRAM POSITIVE COCCI IN BOTH AEROBIC AND ANAEROBIC BOTTLES Gram Stain Report Called to,Read Back By and Verified With: HOWARDTON,M@0727  BY MATTHEWS, B 5.9.22 CRITICAL RESULT CALLED TO, READ BACK BY AND VERIFIED WITH: RN Denman George AT 1813 12/29/2020 BY L BENFIELD Performed at [facility], [address] [place], [place] [address]    Culture METHICILLIN RESISTANT STAPHYLOCOCCUS AUREUS (A)  Final   Report Status 12/31/2020 FINAL  Final   Organism ID, Bacteria METHICILLIN RESISTANT  STAPHYLOCOCCUS AUREUS  Final      Susceptibility   Methicillin resistant staphylococcus aureus - MIC*    CIPROFLOXACIN <=0.5 SENSITIVE Sensitive     ERYTHROMYCIN >=8 RESISTANT Resistant     GENTAMICIN <=  0.5 SENSITIVE Sensitive     OXACILLIN >=4 RESISTANT Resistant     TETRACYCLINE <=1 SENSITIVE Sensitive     VANCOMYCIN <=0.5 SENSITIVE Sensitive     TRIMETH/SULFA <=10 SENSITIVE Sensitive     CLINDAMYCIN <=0.25 SENSITIVE Sensitive     RIFAMPIN <=0.5 SENSITIVE Sensitive     Inducible Clindamycin NEGATIVE Sensitive     * METHICILLIN RESISTANT STAPHYLOCOCCUS AUREUS  Blood culture (routine x 2)     Status: Abnormal   Collection Time: 12/28/20  5:21 PM   Specimen: BLOOD LEFT HAND  Result Value Ref Range Status   Specimen Description   Final    BLOOD LEFT HAND Performed at [facility], [address] [place], [place] [address]    Special Requests   Final    BOTTLES DRAWN AEROBIC AND ANAEROBIC Blood Culture adequate volume Performed at [facility], [address] [place], [place] [address]    Culture  Setup Time   Final    GRAM POSITIVE COCCI ANAEROBIC AND AEROBIC BOTTLES Gram Stain Report Called to,Read Back By and Verified With: HOWARDTON,M@0727  BY MATTHEWS, B 5.9.22 Performed at [facility], [address] [place], [place] [address]    Culture (A)  Final    STAPHYLOCOCCUS AUREUS SUSCEPTIBILITIES PERFORMED ON PREVIOUS CULTURE WITHIN THE LAST 5 DAYS. Performed at [facility], [address] [place], [place] [address]    Report Status 12/31/2020 FINAL  Final  Blood Culture ID Panel (Reflexed)     Status: Abnormal   Collection Time: 12/28/20  5:21 PM  Result Value Ref Range Status   Enterococcus faecalis NOT DETECTED NOT DETECTED Final   Enterococcus Faecium NOT DETECTED NOT DETECTED Final   Listeria monocytogenes NOT DETECTED NOT DETECTED Final   Staphylococcus species DETECTED (A) NOT DETECTED Final    Comment: CRITICAL RESULT CALLED TO, READ BACK BY AND VERIFIED  WITH: RN M HOWERTON AT 1813 12/29/2020 BY L BENFIELD    Staphylococcus aureus (BCID) DETECTED (A) NOT DETECTED Final    Comment: Methicillin (oxacillin)-resistant Staphylococcus aureus (MRSA). MRSA is predictably resistant to beta-lactam antibiotics (except ceftaroline). Preferred therapy is vancomycin unless clinically contraindicated. Patient requires contact precautions if  hospitalized. CRITICAL RESULT CALLED TO, READ BACK BY AND VERIFIED WITH: RN Denman GeorgeM HOWERTON AT 1813 12/29/2020 BY L BENFIELD    Staphylococcus epidermidis NOT DETECTED NOT DETECTED Final   Staphylococcus lugdunensis NOT DETECTED NOT DETECTED Final   Streptococcus species NOT DETECTED NOT DETECTED Final   Streptococcus agalactiae NOT DETECTED NOT DETECTED Final   Streptococcus pneumoniae NOT DETECTED NOT DETECTED Final   Streptococcus pyogenes NOT DETECTED NOT DETECTED Final   A.calcoaceticus-baumannii NOT DETECTED NOT DETECTED Final   Bacteroides fragilis NOT DETECTED NOT DETECTED Final   Enterobacterales NOT DETECTED NOT DETECTED Final   Enterobacter cloacae complex NOT DETECTED NOT DETECTED Final   Escherichia coli NOT DETECTED NOT DETECTED Final   Klebsiella aerogenes NOT DETECTED NOT DETECTED Final   Klebsiella oxytoca NOT DETECTED NOT DETECTED Final   Klebsiella pneumoniae NOT DETECTED NOT DETECTED Final   Proteus species NOT DETECTED NOT DETECTED Final   Salmonella species NOT DETECTED NOT DETECTED Final   Serratia marcescens NOT DETECTED NOT DETECTED Final   Haemophilus influenzae NOT DETECTED NOT DETECTED Final   Neisseria meningitidis NOT DETECTED NOT DETECTED Final   Pseudomonas aeruginosa NOT DETECTED NOT DETECTED Final   Stenotrophomonas maltophilia NOT DETECTED NOT DETECTED Final   Candida albicans NOT DETECTED NOT DETECTED Final   Candida auris NOT DETECTED NOT DETECTED Final  Candida glabrata NOT DETECTED NOT DETECTED Final   Candida krusei NOT DETECTED NOT DETECTED Final   Candida parapsilosis NOT  DETECTED NOT DETECTED Final   Candida tropicalis NOT DETECTED NOT DETECTED Final   Cryptococcus neoformans/gattii NOT DETECTED NOT DETECTED Final   Meth resistant mecA/C and MREJ DETECTED (A) NOT DETECTED Final    Comment: CRITICAL RESULT CALLED TO, READ BACK BY AND VERIFIED WITH: RN Denman George AT 1813 12/29/2020 BY L BENFIELD Performed at [facility], [address] [place], [place] [address]   Respiratory (~20 pathogens) panel by PCR     Status: None   Collection Time: 12/29/20  7:30 AM   Specimen: Nasopharyngeal Swab; Respiratory  Result Value Ref Range Status   Adenovirus NOT DETECTED NOT DETECTED Final   Coronavirus 229E NOT DETECTED NOT DETECTED Final    Comment: (NOTE) The Coronavirus on the Respiratory Panel, DOES NOT test for the novel  Coronavirus (2019 nCoV)    Coronavirus HKU1 NOT DETECTED NOT DETECTED Final   Coronavirus NL63 NOT DETECTED NOT DETECTED Final   Coronavirus OC43 NOT DETECTED NOT DETECTED Final   Metapneumovirus NOT DETECTED NOT DETECTED Final   Rhinovirus / Enterovirus NOT DETECTED NOT DETECTED Final   Influenza A NOT DETECTED NOT DETECTED Final   Influenza B NOT DETECTED NOT DETECTED Final   Parainfluenza Virus 1 NOT DETECTED NOT DETECTED Final   Parainfluenza Virus 2 NOT DETECTED NOT DETECTED Final   Parainfluenza Virus 3 NOT DETECTED NOT DETECTED Final   Parainfluenza Virus 4 NOT DETECTED NOT DETECTED Final   Respiratory Syncytial Virus NOT DETECTED NOT DETECTED Final   Bordetella pertussis NOT DETECTED NOT DETECTED Final   Bordetella Parapertussis NOT DETECTED NOT DETECTED Final   Chlamydophila pneumoniae NOT DETECTED NOT DETECTED Final   Mycoplasma pneumoniae NOT DETECTED NOT DETECTED Final    Comment: Performed at [facility], [address] [place], [place] [address]  MRSA PCR Screening     Status: Abnormal   Collection Time: 12/29/20  7:32 AM   Specimen: Nasal Mucosa; Nasopharyngeal  Result Value Ref Range Status   MRSA by PCR  POSITIVE (A) NEGATIVE Final    Comment:        The GeneXpert MRSA Assay (FDA approved for NASAL specimens only), is one component of a comprehensive MRSA colonization surveillance program. It is not intended to diagnose MRSA infection nor to guide or monitor treatment for MRSA infections. RESULT CALLED TO, READ BACK BY AND VERIFIED WITH: HOWERTON M. AT 1018AM ON [id number] BY THOMPSON S. Performed at [facility], [address], [place], [place] [address]   Culture, blood (Routine X 2) w Reflex to ID Panel     Status: Abnormal   Collection Time: 12/29/20  8:35 PM   Specimen: BLOOD RIGHT HAND  Result Value Ref Range Status   Specimen Description   Final    BLOOD RIGHT HAND Performed at [facility], [address] [place], [place] [address]    Special Requests   Final    BOTTLES DRAWN AEROBIC AND ANAEROBIC Blood Culture results may not be optimal due to an inadequate volume of blood received in culture bottles Performed at [facility], [address], [place], [place] [address]    Culture  Setup Time   Final    ANAEROBIC BOTTLE ONLY GRAM POSITIVE COCCI Gram Stain Report Called to,Read Back By and Verified With: RESULTS PREVIOUSLY CALLED RN MARY ANN HAWLERTON  12/30/20 BY JONES,T [facility] Performed at [facility], [address], [place], [place] [address]    Culture (A)  Final    STAPHYLOCOCCUS AUREUS SUSCEPTIBILITIES PERFORMED ON PREVIOUS CULTURE WITHIN THE LAST 5 DAYS. Performed at [facility], [address], [place], [place] [address]    Report Status 01/01/2021 FINAL  Final  Culture, blood (Routine X 2) w Reflex to ID Panel     Status: Abnormal   Collection Time: 12/29/20  8:36 PM   Specimen: BLOOD  Result Value Ref Range Status   Specimen Description   Final    BLOOD RIGHT ANTECUBITAL Performed at [facility], [address] [place], [place] [address]    Special Requests   Final    BOTTLES DRAWN AEROBIC AND ANAEROBIC Blood Culture results may not be optimal  due to an inadequate volume of blood received in culture bottles Performed at [facility], [address], [place], [place] [address]    Culture  Setup Time   Final    IN BOTH AEROBIC AND ANAEROBIC BOTTLES GRAM POSITIVE COCCI Gram Stain Report Called to,Read Back By and Verified With: MARY ANN HAWLERTON, RN @1354  12/30/20 BY JONES,T [facility] CRITICAL VALUE NOTED.  VALUE IS CONSISTENT WITH PREVIOUSLY REPORTED AND CALLED VALUE.    Culture (A)  Final    STAPHYLOCOCCUS AUREUS SUSCEPTIBILITIES PERFORMED ON PREVIOUS CULTURE WITHIN THE LAST 5 DAYS. Performed at [facility], [address], [place], [place]    Report Status 01/01/2021 FINAL  Final  Culture, blood (routine x 2)     Status: Abnormal   Collection Time: 12/30/20  4:20 PM   Specimen: BLOOD RIGHT ARM  Result Value Ref Range Status   Specimen Description   Final    BLOOD RIGHT ARM Performed at [facility], [address] [place], [place]    Special Requests   Final    BOTTLES DRAWN AEROBIC AND ANAEROBIC Blood Culture adequate volume Performed at [facility], [address] [place], [place]    Culture  Setup Time   Final    ANAEROBIC BOTTLE ONLY GRAM POSITIVE COCCI Gram Stain Report Called to,Read Back By and Verified With: T HAIRSTON,RN@0307  01/01/21 MKELLY CRITICAL VALUE NOTED.  VALUE IS CONSISTENT WITH PREVIOUSLY REPORTED AND CALLED VALUE. AEROBIC BOTTLE  GRAM POSITIVE COCCI PREVIOUSLY CALLED Performed at [facility], [address], [place], [place]    Culture (A)  Final    STAPHYLOCOCCUS AUREUS SUSCEPTIBILITIES PERFORMED ON PREVIOUS CULTURE WITHIN THE LAST 5 DAYS. Performed at [facility], [address] [place], [place]    Report Status 01/04/2021 FINAL  Final  Culture, blood (routine x 2)     Status: Abnormal   Collection Time: 12/30/20  4:20 PM   Specimen: BLOOD LEFT ARM  Result Value Ref Range Status   Specimen Description   Final    BLOOD LEFT  ARM Performed at [facility], [address], [place], [place]    Special Requests   Final    BOTTLES DRAWN AEROBIC AND ANAEROBIC Blood Culture adequate volume Performed at [facility], [address] [place], [place]    Culture  Setup Time   Final    GRAM POSITIVE COCCI ANAEROBIC BOTTLE Gram Stain Report Called to,Read Back By and Verified With: P BENGTSON 01/01/21 @ 0857 BY 03/03/21 Performed at [facility], [address], [place], [place]    Culture (A)  Final    STAPHYLOCOCCUS AUREUS SUSCEPTIBILITIES PERFORMED ON  PREVIOUS CULTURE WITHIN THE LAST 5 DAYS. Performed at [facility], [address], [place], [place] [address]    Report Status 01/03/2021 FINAL  Final  Culture, blood (Routine X 2) w Reflex to ID Panel     Status: None (Preliminary result)   Collection Time: 01/01/21  6:41 PM   Specimen: BLOOD LEFT ARM  Result Value Ref Range Status   Specimen Description BLOOD LEFT ARM  Final   Special Requests   Final    BOTTLES DRAWN AEROBIC AND ANAEROBIC Blood Culture results may not be optimal due to an excessive volume of blood received in culture bottles   Culture   Final    NO GROWTH 2 DAYS Performed at [facility], [address], [place], [place] [address]    Report Status PENDING  Incomplete  Culture, blood (Routine X 2) w Reflex to ID Panel     Status: None (Preliminary result)   Collection Time: 01/01/21  6:41 PM   Specimen: BLOOD RIGHT ARM  Result Value Ref Range Status   Specimen Description BLOOD RIGHT ARM  Final   Special Requests   Final    BOTTLES DRAWN AEROBIC ONLY Blood Culture results may not be optimal due to an inadequate volume of blood received in culture bottles   Culture   Final    NO GROWTH 2 DAYS Performed at [facility], [address], [place], [place] [address]    Report Status PENDING  Incomplete     Radiology Studies: DG Chest 2 View  Result Date: 01/03/2021 CLINICAL DATA:  Chest pain, shortness of  breath, liver disease EXAM: CHEST - 2 VIEW COMPARISON:  12/28/2020 FINDINGS: The heart and mediastinum are normal. Moderate right, small left pleural effusions and associated atelectasis or consolidation. Osseous structures are unremarkable. IMPRESSION: Moderate right, small left pleural effusions and associated atelectasis or consolidation. Electronically Signed   By: Lauralyn Primes M.D.   On: 01/03/2021 15:32   ECHO TEE  Result Date: 01/02/2021    TRANSESOPHOGEAL ECHO REPORT   Patient Name:   George Lucas [facility] Date of Exam: 01/02/2021 Medical Rec #:  [id number]         Height:       74.0 in Accession #:    [id number]        Weight:       180.0 lb Date of Birth:  28-Aug-1990          BSA:          2.078 m Patient Age:    30 years          BP:           111/63 mmHg Patient Gender: M                 HR:           78 bpm. [facility] Procedure: Transesophageal Echo Indications:    Bacteremia  History:        Patient has prior history of Echocardiogram examinations, most                 recent 12/29/2020. Risk Factors:Current Smoker. IVDU, Multifocal                 Pneumonia, Sepsis, Septic Embolism.  Sonographer:    Jeryl Columbia RDCS (AE) Referring Phys: [id number] Ellsworth Lennox PROCEDURE: After discussion of the risks and benefits of a TEE, an informed consent was obtained from the patient.  TEE procedure time was 8 minutes. The transesophogeal probe was passed without difficulty through the esophogus of the patient. Imaged were  obtained with the patient in a left lateral decubitus position. Local oropharyngeal anesthetic was provided with viscous lidocaine. Sedation performed by different physician. The patient was monitored while under deep sedation. Anesthestetic sedation was provided intravenously by Anesthesiology:  of Propofol. Image quality was good. The patient's vital signs; including heart rate, blood pressure, and oxygen saturation; remained stable throughout the procedure. The patient  developed no complications during the procedure. IMPRESSIONS  1. Left ventricular ejection fraction, by estimation, is 60 to 65%. The left ventricle has normal function.  2. Right ventricular systolic function is normal. The right ventricular size is normal.  3. No left atrial/left atrial appendage thrombus was detected.  4. The mitral valve is grossly normal. Trivial mitral valve regurgitation.  5. The aortic valve is tricuspid. Aortic valve regurgitation is not visualized.  6. Descending aorta without significant atherosclerosis.  7. No valvular vegetations. FINDINGS  Left Ventricle: Left ventricular ejection fraction, by estimation, is 60 to 65%. The left ventricle has normal function. The left ventricular internal cavity size was normal in size. Right Ventricle: The right ventricular size is normal. Right vetricular wall thickness was not assessed. Right ventricular systolic function is normal. Left Atrium: Left atrial size was normal in size. No left atrial/left atrial appendage thrombus was detected. Right Atrium: Right atrial size was normal in size. Pericardium: There is no evidence of pericardial effusion. Mitral Valve: The mitral valve is grossly normal. Trivial mitral valve regurgitation. Tricuspid Valve: The tricuspid valve is grossly normal. Tricuspid valve regurgitation is trivial. Aortic Valve: The aortic valve is tricuspid. Aortic valve regurgitation is not visualized. Pulmonic Valve: The pulmonic valve was grossly normal. Pulmonic valve regurgitation is trivial. Aorta: Descending aorta without significant atherosclerosis. The aortic root is normal in size and structure. IAS/Shunts: No atrial level shunt detected by color flow Doppler. Nona Dell MD Electronically signed by Nona Dell MD Signature Date/Time: 01/02/2021/10:41:05 AM    Final    Scheduled Meds: . buprenorphine-naloxone  1 tablet Sublingual Q1200  . buprenorphine-naloxone  1 tablet Sublingual BID  . cholecalciferol  2,000  Units Oral Daily  . enoxaparin (LOVENOX) injection  40 mg Subcutaneous Q24H  . nicotine  21 mg Transdermal Daily  . pantoprazole  40 mg Oral QPM  . polyethylene glycol  17 g Oral Daily  . senna-docusate  2 tablet Oral QHS   Continuous Infusions: . vancomycin 1,000 mg (01/03/21 2103)    LOS: 7 days   Time spent: 35 mins   Norm Parcel, MS4 How to contact the [facility] Attending or Consulting provider 7A - 7P or covering provider during after hours 7P -7A, for this patient?  1. Check the care team in [facility] and look for a) attending/consulting TR[facility] provider listed and b) the [facility] team listed 2. Log into [email or web address] and use [place]'s universal password to access. If you do not have the password, please contact the hospital operator. 3. Locate the [facility] provider you are looking for under [facility] and page to a number that you can be directly reached. 4. If you still have difficulty reaching the provider, please page the [facility] (Director on Call) for the Hospitalists listed on amion for assistance.  01/04/2021, 10:07 AM   _____________________________________________  Patient seen and examined with Norm Parcel, Medical student. In addition to supervising the encounter, I played a key role in the decision making process as  well as reviewed key findings.  I agree with the documentation and made changes as needed.  We have been slowly titrating up his suboxone dose with the assistance of the pharmD.  He remains stable at this time but we are focusing on ambulating more.  Repeated BC from 5/12 remain no growth.    Maryln Manuel MD [facility] How to contact the [facility] Attending or Consulting provider 7A - 7P or covering provider during after hours 7P -7A, for this patient?  1. Check the care team in [facility] and look for a) attending/consulting TR[facility] provider listed and b) the [facility] team listed 2. Log into [email or web address] and use [place]'s universal password to access. If you do not have the  password, please contact the hospital operator. 3. Locate the [facility] provider you are looking for under [facility] and page to a number that you can be directly reached. 4. If you still have difficulty reaching the provider, please page the [facility] (Director on Call) for the Hospitalists listed on amion for assistance.

## 2021-01-05 ENCOUNTER — Encounter (HOSPITAL_COMMUNITY): Payer: Self-pay | Admitting: Cardiology

## 2021-01-05 DIAGNOSIS — A419 Sepsis, unspecified organism: Secondary | ICD-10-CM | POA: Diagnosis not present

## 2021-01-05 DIAGNOSIS — E871 Hypo-osmolality and hyponatremia: Secondary | ICD-10-CM | POA: Diagnosis not present

## 2021-01-05 DIAGNOSIS — I76 Septic arterial embolism: Secondary | ICD-10-CM | POA: Diagnosis not present

## 2021-01-05 DIAGNOSIS — J189 Pneumonia, unspecified organism: Secondary | ICD-10-CM | POA: Diagnosis not present

## 2021-01-05 LAB — CBC WITH DIFFERENTIAL/PLATELET
Abs Immature Granulocytes: 0.15 10*3/uL — ABNORMAL HIGH (ref 0.00–0.07)
Basophils Absolute: 0.1 10*3/uL (ref 0.0–0.1)
Basophils Relative: 1 %
Eosinophils Absolute: 0.3 10*3/uL (ref 0.0–0.5)
Eosinophils Relative: 3 %
HCT: 36.7 % — ABNORMAL LOW (ref 39.0–52.0)
Hemoglobin: 12 g/dL — ABNORMAL LOW (ref 13.0–17.0)
Immature Granulocytes: 1 %
Lymphocytes Relative: 23 %
Lymphs Abs: 2.8 10*3/uL (ref 0.7–4.0)
MCH: 28.9 pg (ref 26.0–34.0)
MCHC: 32.7 g/dL (ref 30.0–36.0)
MCV: 88.4 fL (ref 80.0–100.0)
Monocytes Absolute: 0.9 10*3/uL (ref 0.1–1.0)
Monocytes Relative: 8 %
Neutro Abs: 8 10*3/uL — ABNORMAL HIGH (ref 1.7–7.7)
Neutrophils Relative %: 64 %
Platelets: 473 10*3/uL — ABNORMAL HIGH (ref 150–400)
RBC: 4.15 MIL/uL — ABNORMAL LOW (ref 4.22–5.81)
RDW: 14.5 % (ref 11.5–15.5)
WBC: 12.4 10*3/uL — ABNORMAL HIGH (ref 4.0–10.5)
nRBC: 0 % (ref 0.0–0.2)

## 2021-01-05 LAB — HCV RNA QUANT RFLX ULTRA OR GENOTYP
HCV RNA Qnt(log copy/mL): 4.978 log10 IU/mL
HepC Qn: 95100 IU/mL

## 2021-01-05 LAB — MAGNESIUM: Magnesium: 2 mg/dL (ref 1.7–2.4)

## 2021-01-05 LAB — COMPREHENSIVE METABOLIC PANEL
ALT: 73 U/L — ABNORMAL HIGH (ref 0–44)
AST: 61 U/L — ABNORMAL HIGH (ref 15–41)
Albumin: 2.1 g/dL — ABNORMAL LOW (ref 3.5–5.0)
Alkaline Phosphatase: 78 U/L (ref 38–126)
Anion gap: 6 (ref 5–15)
BUN: 7 mg/dL (ref 6–20)
CO2: 28 mmol/L (ref 22–32)
Calcium: 7.8 mg/dL — ABNORMAL LOW (ref 8.9–10.3)
Chloride: 100 mmol/L (ref 98–111)
Creatinine, Ser: 0.64 mg/dL (ref 0.61–1.24)
GFR, Estimated: >60 mL/min (ref >60)
Glucose, Bld: 111 mg/dL — ABNORMAL HIGH (ref 70–99)
Potassium: 3.9 mmol/L (ref 3.5–5.1)
Sodium: 134 mmol/L — ABNORMAL LOW (ref 135–145)
Total Bilirubin: 0.5 mg/dL (ref 0.3–1.2)
Total Protein: 6.9 g/dL (ref 6.5–8.1)

## 2021-01-05 LAB — HEPATITIS C GENOTYPE

## 2021-01-05 LAB — PATHOLOGIST SMEAR REVIEW

## 2021-01-05 NOTE — Progress Notes (Addendum)
PROGRESS NOTE   George Lucas  ZOX:[id number] DOB: [birth year] DOA: 12/28/2020 PCP: System, Provider Not In   Chief Complaint  Patient presents with   Chest Pain   Level of care: Med-Surg  Brief Admission History:  30 y.o. male with medical history significant of nicotine dependence, IVDU presented to ED with a complaint of chest pain.  Currently patient, he woke up at 10 o'clock in the morning with the chest pain.  He then had fever of 104 at around 2:30 AM.  He also started having some dry cough.  He arrived to the ED at around 2 PM as he did not have a ride.  No other complaint.  He claims that he has been clean and has not used IVDU in about 2 months but continues to smoke.   ED Course: Upon arrival to ED, he was tachycardic and tachypneic and afebrile.  CBC showed white cells of 17.  BMP showed hyponatremia and hyperglycemia.  Chest x-ray unremarkable however CT angiogram of the chest showed multifocal pneumonia and septic emboli.  Patient received vancomycin in the ED as well as 1 L of IV fluid bolus.  Hospitalist service were consulted to admit the patient for further management for possible bacterial endocarditis.  Assessment & Plan:   Active Problems:   Multifocal pneumonia   Severe sepsis (HCC)   Septic embolism (HCC)   Acute hyponatremia   Bacteremia  Severe sepsis from MRSA bacteremia and multifocal pneumonia - Continue IV vancomcycin per ID recommendation, TEE did not show vegetations, pt continues to be afebrile, but last 2 days (5/15 and 5/16) WBC elevated from 10.7 to 12.4.  Persistent bacteremia - continue IV vancomycin, repeated BC 5/12 no growth to date. WBC count elevated to 12.4 today, pt afebrile and denies fevers, chills, or changes in night sweats.  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  ID recommending to keep in hospital for 2 weeks of IV therapy followed by long acting therapy.      Mild R chest pain during inspiration - CXR w/ some bibasilar fluid, not concerning. Encouraged pt to ambulate and use inspiratory spirometer. Pt denies coughs or other concerning ssx at this time.  HCV - HCV quant elevated, reflex genotype in progress. ID following.  He will need outpatient ID follow up at the [facility].    Opioid Dependence - Pt having withdrawal symptoms.  Discussed with pharm D.  Increased suboxone to TID with noontime dose added on 5/15.  Toradol has completed after today and ibuprofen ordered for breakthrough symptoms starting 5/15.  Opioid induced constipation - Last BM 3 days ago 5/13. He had previously been refusing to take the laxatives offered since admission but took them yesterday 5/16.    DVT prophylaxis: enoxaparin  Code Status: full  Family Communication: wife telephone update and at bedside Disposition: anticipating at least 2 weeks in hospital for IV antibiotics Status is: Inpatient  Remains inpatient appropriate because:IV treatments appropriate due to intensity of illness or inability to take PO and Inpatient level of care appropriate due to severity of illness  Dispo: The patient is from: Home              Anticipated d/c is to: Home              Patient currently is not medically stable to d/c.   Difficult to place patient No  Consultants:  ID  Cardiology   Procedures:  TEE on 5/13, no abnormalities or vegetations noted  Antimicrobials:  Vancomycin  5/10>>  Subjective: Pt feels much better today, still afebrile. Reports improvement in inspiratory pain on R side, pt has been following recommendations to ambulate and use inspiratory spirometer. Despite elevated WBC pt denies fevers, chills, changes in baseline night sweats, nausea, vomiting, or other concerns.  Objective: Vitals:   01/04/21 0513 01/04/21 1405 01/04/21 2048 01/05/21 0602  BP: 130/86 130/74 120/79 119/75  Pulse: 81 (!) 107 98 87  Resp: 18 20 18 18   Temp: 98.1 F (36.7  C) 99 F (37.2 C) 100.1 F (37.8 C) 99.1 F (37.3 C)  TempSrc: Oral Oral Oral Oral  SpO2: 98% 99% 98% 97%  Weight:      Height:        Intake/Output Summary (Last 24 hours) at 01/05/2021 1306 Last data filed at 01/05/2021 0854 Gross per 24 hour  Intake 840 ml  Output --  Net 840 ml   Filed Weights   12/28/20 1702  Weight: 81.6 kg    Examination:  General exam: diaphoretic male, awake, alert, Appears calm and NAD.   Respiratory system: Clear to auscultation. Respiratory effort normal. Cardiovascular system: normal S1 & S2 heard. No JVD, rubs, gallops or clicks. No pedal edema. Gastrointestinal system: Abdomen is nondistended and soft. RUQ pain to light and deep palpation per baseline according to pt. No splenomegaly but UTA liver margin due to pain. Normal bowel sounds heard. Central nervous system: Alert and oriented. No focal neurological deficits. Extremities: Pt spontaneously moving all extremities with full strength and ROM. Skin: No rashes, lesions or ulcers. No jaundice, ascites or caput medusa. Psychiatry: Judgement and insight appear poor. Mood & affect appropriate.   Data Reviewed: I have personally reviewed following labs and imaging studies  CBC: Recent Labs  Lab 12/31/20 0252 01/01/21 0612 01/02/21 0507 01/03/21 0538 01/05/21 0458  WBC 10.2 9.8 11.0* 10.7* 12.4*  NEUTROABS  --  6.7 7.6 6.1 8.0*  HGB 13.2 12.6* 11.8* 12.8* 12.0*  HCT 39.3 36.9* 35.1* 38.7* 36.7*  MCV 88.1 86.4 87.1 88.8 88.4  PLT 146* 171 238 322 473*    Basic Metabolic Panel: Recent Labs  Lab 12/31/20 0252 01/01/21 0612 01/02/21 0507 01/03/21 0538 01/04/21 0410 01/05/21 0458  NA 131* 131* 135 136  --  134*  K 3.2* 3.0* 3.6 3.5  --  3.9  CL 99 101 102 102  --  100  CO2 26 26 24 27   --  28  GLUCOSE 113* 158* 116* 108*  --  111*  BUN 7 8 7 7   --  7  CREATININE 0.56* 0.62 0.56* 0.47* 0.47* 0.64  CALCIUM 7.4* 7.1* 7.2* 7.7*  --  7.8*  MG 1.6* 2.0 1.9 1.9  --  2.0     GFR: Estimated Creatinine Clearance: 157.3 mL/min (by C-G formula based on SCr of 0.64 mg/dL).  Liver Function Tests: Recent Labs  Lab 01/01/21 0612 01/02/21 0507 01/03/21 0538 01/05/21 0458  AST 39 51* 58* 61*  ALT 41 51* 63* 73*  ALKPHOS 120 103 92 78  BILITOT 0.7 0.8 0.8 0.5  PROT 5.5* 5.7* 6.1* 6.9  ALBUMIN 2.1* 2.1* 2.1* 2.1*    CBG: Recent Labs  Lab 01/04/21 0758  GLUCAP 120*    Recent Results (from the past 240 hour(s))  Resp Panel by RT-PCR (Flu A&B, Covid) Nasopharyngeal Swab     Status: None   Collection Time: 12/28/20  2:29 PM  Specimen: Nasopharyngeal Swab; Nasopharyngeal(NP) swabs in vial transport medium  Result Value Ref Range Status   SARS Coronavirus 2 by RT PCR NEGATIVE NEGATIVE Final    Comment: (NOTE) SARS-CoV-2 target nucleic acids are NOT DETECTED.  The SARS-CoV-2 RNA is generally detectable in upper respiratory specimens during the acute phase of infection. The lowest concentration of SARS-CoV-2 viral copies this assay can detect is 138 copies/mL. A negative result does not preclude SARS-Cov-2 infection and should not be used as the sole basis for treatment or other patient management decisions. A negative result may occur with  improper specimen collection/handling, submission of specimen other than nasopharyngeal swab, presence of viral mutation(s) within the areas targeted by this assay, and inadequate number of viral copies(<138 copies/mL). A negative result must be combined with clinical observations, patient history, and epidemiological information. The expected result is Negative.  Fact Sheet for Patients:  BloggerCourse.com  Fact Sheet for Healthcare Providers:  SeriousBroker.it  This test is no t yet approved or cleared by the [place] FDA and  has been authorized for detection and/or diagnosis of SARS-CoV-2 by FDA under an Emergency Use Authorization (EUA). This EUA  will remain  in effect (meaning this test can be used) for the duration of the COVID-19 declaration under Section 564(b)(1) of the Act, 21 U.S.C.section 360bbb-3(b)(1), unless the authorization is terminated  or revoked sooner.       Influenza A by PCR NEGATIVE NEGATIVE Final   Influenza B by PCR NEGATIVE NEGATIVE Final    Comment: (NOTE) The Xpert Xpress SARS-CoV-2/FLU/RSV plus assay is intended as an aid in the diagnosis of influenza from Nasopharyngeal swab specimens and should not be used as a sole basis for treatment. Nasal washings and aspirates are unacceptable for Xpert Xpress SARS-CoV-2/FLU/RSV testing.  Fact Sheet for Patients: BloggerCourse.com  Fact Sheet for Healthcare Providers: SeriousBroker.it  This test is not yet approved or cleared by the [place] FDA and has been authorized for detection and/or diagnosis of SARS-CoV-2 by FDA under an Emergency Use Authorization (EUA). This EUA will remain in effect (meaning this test can be used) for the duration of the COVID-19 declaration under Section 564(b)(1) of the Act, 21 U.S.C. section 360bbb-3(b)(1), unless the authorization is terminated or revoked.  Performed at [facility], [address], [place], [place] [address]   Blood culture (routine x 2)     Status: Abnormal   Collection Time: 12/28/20  5:21 PM   Specimen: BLOOD RIGHT HAND  Result Value Ref Range Status   Specimen Description   Final    BLOOD RIGHT HAND Performed at [facility], [address], [place], [place] [address]    Special Requests   Final    BOTTLES DRAWN AEROBIC AND ANAEROBIC Blood Culture adequate volume Performed at [facility], [address], [place], [place] [address]    Culture  Setup Time   Final    GRAM POSITIVE COCCI IN BOTH AEROBIC AND ANAEROBIC BOTTLES Gram Stain Report Called to,Read Back By and Verified With: HOWARDTON,M@0727  BY MATTHEWS, B 5.9.22 CRITICAL RESULT  CALLED TO, READ BACK BY AND VERIFIED WITH: RN Denman George AT 1813 12/29/2020 BY L BENFIELD Performed at [facility], [address] [place], [place] [address]    Culture METHICILLIN RESISTANT STAPHYLOCOCCUS AUREUS (A)  Final   Report Status 12/31/2020 FINAL  Final   Organism ID, Bacteria METHICILLIN RESISTANT STAPHYLOCOCCUS AUREUS  Final      Susceptibility   Methicillin resistant staphylococcus aureus - MIC*  CIPROFLOXACIN <=0.5 SENSITIVE Sensitive     ERYTHROMYCIN >=8 RESISTANT Resistant     GENTAMICIN <=0.5 SENSITIVE Sensitive     OXACILLIN >=4 RESISTANT Resistant     TETRACYCLINE <=1 SENSITIVE Sensitive     VANCOMYCIN <=0.5 SENSITIVE Sensitive     TRIMETH/SULFA <=10 SENSITIVE Sensitive     CLINDAMYCIN <=0.25 SENSITIVE Sensitive     RIFAMPIN <=0.5 SENSITIVE Sensitive     Inducible Clindamycin NEGATIVE Sensitive     * METHICILLIN RESISTANT STAPHYLOCOCCUS AUREUS  Blood culture (routine x 2)     Status: Abnormal   Collection Time: 12/28/20  5:21 PM   Specimen: BLOOD LEFT HAND  Result Value Ref Range Status   Specimen Description   Final    BLOOD LEFT HAND Performed at [facility], [address] [place], [place] [address]    Special Requests   Final    BOTTLES DRAWN AEROBIC AND ANAEROBIC Blood Culture adequate volume Performed at [facility], [address], [place], [place] [address]    Culture  Setup Time   Final    GRAM POSITIVE COCCI ANAEROBIC AND AEROBIC BOTTLES Gram Stain Report Called to,Read Back By and Verified With: HOWARDTON,M@0727  BY MATTHEWS, B 5.9.22 Performed at [facility], [address] [place], [place] [address]    Culture (A)  Final    STAPHYLOCOCCUS AUREUS SUSCEPTIBILITIES PERFORMED ON PREVIOUS CULTURE WITHIN THE LAST 5 DAYS. Performed at [facility], [address] [place], [place] [address]    Report Status 12/31/2020 FINAL  Final  Blood Culture ID Panel (Reflexed)     Status: Abnormal   Collection Time: 12/28/20  5:21 PM  Result  Value Ref Range Status   Enterococcus faecalis NOT DETECTED NOT DETECTED Final   Enterococcus Faecium NOT DETECTED NOT DETECTED Final   Listeria monocytogenes NOT DETECTED NOT DETECTED Final   Staphylococcus species DETECTED (A) NOT DETECTED Final    Comment: CRITICAL RESULT CALLED TO, READ BACK BY AND VERIFIED WITH: RN M HOWERTON AT 1813 12/29/2020 BY L BENFIELD    Staphylococcus aureus (BCID) DETECTED (A) NOT DETECTED Final    Comment: Methicillin (oxacillin)-resistant Staphylococcus aureus (MRSA). MRSA is predictably resistant to beta-lactam antibiotics (except ceftaroline). Preferred therapy is vancomycin unless clinically contraindicated. Patient requires contact precautions if  hospitalized. CRITICAL RESULT CALLED TO, READ BACK BY AND VERIFIED WITH: RN Denman George AT 1813 12/29/2020 BY L BENFIELD    Staphylococcus epidermidis NOT DETECTED NOT DETECTED Final   Staphylococcus lugdunensis NOT DETECTED NOT DETECTED Final   Streptococcus species NOT DETECTED NOT DETECTED Final   Streptococcus agalactiae NOT DETECTED NOT DETECTED Final   Streptococcus pneumoniae NOT DETECTED NOT DETECTED Final   Streptococcus pyogenes NOT DETECTED NOT DETECTED Final   A.calcoaceticus-baumannii NOT DETECTED NOT DETECTED Final   Bacteroides fragilis NOT DETECTED NOT DETECTED Final   Enterobacterales NOT DETECTED NOT DETECTED Final   Enterobacter cloacae complex NOT DETECTED NOT DETECTED Final   Escherichia coli NOT DETECTED NOT DETECTED Final   Klebsiella aerogenes NOT DETECTED NOT DETECTED Final   Klebsiella oxytoca NOT DETECTED NOT DETECTED Final   Klebsiella pneumoniae NOT DETECTED NOT DETECTED Final   Proteus species NOT DETECTED NOT DETECTED Final   Salmonella species NOT DETECTED NOT DETECTED Final   Serratia marcescens NOT DETECTED NOT DETECTED Final   Haemophilus influenzae NOT DETECTED NOT DETECTED Final   Neisseria meningitidis NOT DETECTED NOT DETECTED Final   Pseudomonas aeruginosa NOT DETECTED  NOT DETECTED Final   Stenotrophomonas maltophilia NOT DETECTED NOT DETECTED Final  Candida albicans NOT DETECTED NOT DETECTED Final   Candida auris NOT DETECTED NOT DETECTED Final   Candida glabrata NOT DETECTED NOT DETECTED Final   Candida krusei NOT DETECTED NOT DETECTED Final   Candida parapsilosis NOT DETECTED NOT DETECTED Final   Candida tropicalis NOT DETECTED NOT DETECTED Final   Cryptococcus neoformans/gattii NOT DETECTED NOT DETECTED Final   Meth resistant mecA/C and MREJ DETECTED (A) NOT DETECTED Final    Comment: CRITICAL RESULT CALLED TO, READ BACK BY AND VERIFIED WITH: RN Denman George AT 1813 12/29/2020 BY L BENFIELD Performed at [facility], [address] [place], [place] [address]   Respiratory (~20 pathogens) panel by PCR     Status: None   Collection Time: 12/29/20  7:30 AM   Specimen: Nasopharyngeal Swab; Respiratory  Result Value Ref Range Status   Adenovirus NOT DETECTED NOT DETECTED Final   Coronavirus 229E NOT DETECTED NOT DETECTED Final    Comment: (NOTE) The Coronavirus on the Respiratory Panel, DOES NOT test for the novel  Coronavirus (2019 nCoV)    Coronavirus HKU1 NOT DETECTED NOT DETECTED Final   Coronavirus NL63 NOT DETECTED NOT DETECTED Final   Coronavirus OC43 NOT DETECTED NOT DETECTED Final   Metapneumovirus NOT DETECTED NOT DETECTED Final   Rhinovirus / Enterovirus NOT DETECTED NOT DETECTED Final   Influenza A NOT DETECTED NOT DETECTED Final   Influenza B NOT DETECTED NOT DETECTED Final   Parainfluenza Virus 1 NOT DETECTED NOT DETECTED Final   Parainfluenza Virus 2 NOT DETECTED NOT DETECTED Final   Parainfluenza Virus 3 NOT DETECTED NOT DETECTED Final   Parainfluenza Virus 4 NOT DETECTED NOT DETECTED Final   Respiratory Syncytial Virus NOT DETECTED NOT DETECTED Final   Bordetella pertussis NOT DETECTED NOT DETECTED Final   Bordetella Parapertussis NOT DETECTED NOT DETECTED Final   Chlamydophila pneumoniae NOT DETECTED NOT DETECTED Final    Mycoplasma pneumoniae NOT DETECTED NOT DETECTED Final    Comment: Performed at [facility], [address] [place], [place] [address]  MRSA PCR Screening     Status: Abnormal   Collection Time: 12/29/20  7:32 AM   Specimen: Nasal Mucosa; Nasopharyngeal  Result Value Ref Range Status   MRSA by PCR POSITIVE (A) NEGATIVE Final    Comment:        The GeneXpert MRSA Assay (FDA approved for NASAL specimens only), is one component of a comprehensive MRSA colonization surveillance program. It is not intended to diagnose MRSA infection nor to guide or monitor treatment for MRSA infections. RESULT CALLED TO, READ BACK BY AND VERIFIED WITH: HOWERTON M. AT 1018AM ON [id number] BY THOMPSON S. Performed at [facility], [address] [place], [place] [address]   Culture, blood (Routine X 2) w Reflex to ID Panel     Status: Abnormal   Collection Time: 12/29/20  8:35 PM   Specimen: BLOOD RIGHT HAND  Result Value Ref Range Status   Specimen Description   Final    BLOOD RIGHT HAND Performed at [facility], [address], [place], [place] [address]    Special Requests   Final    BOTTLES DRAWN AEROBIC AND ANAEROBIC Blood Culture results may not be optimal due to an inadequate volume of blood received in culture bottles Performed at [facility], [address] [place], [place] [address]    Culture  Setup Time   Final    ANAEROBIC BOTTLE ONLY GRAM POSITIVE COCCI Gram Stain Report Called to,Read Back By and Verified  With: RESULTS PREVIOUSLY CALLED RN MARY ANN HAWLERTON @1324  12/30/20 BY JONES,T [facility] Performed at [facility], [address] [place], [place]    Culture (A)  Final    STAPHYLOCOCCUS AUREUS SUSCEPTIBILITIES PERFORMED ON PREVIOUS CULTURE WITHIN THE LAST 5 DAYS. Performed at [facility], [address] [place], [place]    Report Status 01/01/2021 FINAL  Final  Culture, blood (Routine X 2) w Reflex to ID Panel     Status: Abnormal   Collection Time:  12/29/20  8:36 PM   Specimen: BLOOD  Result Value Ref Range Status   Specimen Description   Final    BLOOD RIGHT ANTECUBITAL Performed at [facility], [address], [place], [place]    Special Requests   Final    BOTTLES DRAWN AEROBIC AND ANAEROBIC Blood Culture results may not be optimal due to an inadequate volume of blood received in culture bottles Performed at [facility], [address], [place], [place]    Culture  Setup Time   Final    IN BOTH AEROBIC AND ANAEROBIC BOTTLES GRAM POSITIVE COCCI Gram Stain Report Called to,Read Back By and Verified With: MARY ANN HAWLERTON, RN @1354  12/30/20 BY JONES,T [facility] CRITICAL VALUE NOTED.  VALUE IS CONSISTENT WITH PREVIOUSLY REPORTED AND CALLED VALUE.    Culture (A)  Final    STAPHYLOCOCCUS AUREUS SUSCEPTIBILITIES PERFORMED ON PREVIOUS CULTURE WITHIN THE LAST 5 DAYS. Performed at [facility], [address], [place], [address] [place]    Report Status 01/01/2021 FINAL  Final  Culture, blood (routine x 2)     Status: Abnormal   Collection Time: 12/30/20  4:20 PM   Specimen: BLOOD RIGHT ARM  Result Value Ref Range Status   Specimen Description   Final    BLOOD RIGHT ARM Performed at [facility], [address], [place], [address] [place]    Special Requests   Final    BOTTLES DRAWN AEROBIC AND ANAEROBIC Blood Culture adequate volume Performed at [facility], [address] [place], [address] [place]    Culture  Setup Time   Final    ANAEROBIC BOTTLE ONLY GRAM POSITIVE COCCI Gram Stain Report Called to,Read Back By and Verified With: T HAIRSTON,RN@0307  01/01/21 MKELLY CRITICAL VALUE NOTED.  VALUE IS CONSISTENT WITH PREVIOUSLY REPORTED AND CALLED VALUE. AEROBIC BOTTLE  GRAM POSITIVE COCCI PREVIOUSLY CALLED Performed at [facility], [address], [place], [address] [place]    Culture (A)  Final    STAPHYLOCOCCUS AUREUS SUSCEPTIBILITIES PERFORMED ON PREVIOUS CULTURE WITHIN THE LAST 5 DAYS. Performed  at [facility], [address] [place], [address] [place]    Report Status 01/04/2021 FINAL  Final  Culture, blood (routine x 2)     Status: Abnormal   Collection Time: 12/30/20  4:20 PM   Specimen: BLOOD LEFT ARM  Result Value Ref Range Status   Specimen Description   Final    BLOOD LEFT ARM Performed at [facility], [address], [place], [address] [place]    Special Requests   Final    BOTTLES DRAWN AEROBIC AND ANAEROBIC Blood Culture adequate volume Performed at [facility], [address] [place], [address] [place]    Culture  Setup Time   Final    GRAM POSITIVE COCCI ANAEROBIC BOTTLE Gram Stain Report Called to,Read Back By and Verified With: P BENGTSON 01/01/21 @ 0857 BY [id number] Performed at [facility], [address] [place],  [place] [address]    Culture (A)  Final    STAPHYLOCOCCUS AUREUS SUSCEPTIBILITIES PERFORMED ON PREVIOUS CULTURE WITHIN THE LAST 5 DAYS. Performed at [facility], [address] [place], [place] [address]    Report Status 01/03/2021 FINAL  Final  Culture, blood (Routine X 2) w Reflex to ID Panel     Status: None (Preliminary result)   Collection Time: 01/01/21  6:41 PM   Specimen: BLOOD LEFT ARM  Result Value Ref Range Status   Specimen Description BLOOD LEFT ARM  Final   Special Requests   Final    BOTTLES DRAWN AEROBIC AND ANAEROBIC Blood Culture results may not be optimal due to an excessive volume of blood received in culture bottles   Culture   Final    NO GROWTH 4 DAYS Performed at [facility], [address] [place], [place] [address]    Report Status PENDING  Incomplete  Culture, blood (Routine X 2) w Reflex to ID Panel     Status: None (Preliminary result)   Collection Time: 01/01/21  6:41 PM   Specimen: BLOOD RIGHT ARM  Result Value Ref Range Status   Specimen Description BLOOD RIGHT ARM  Final   Special Requests   Final    BOTTLES DRAWN AEROBIC ONLY Blood Culture results may not be optimal due to an inadequate  volume of blood received in culture bottles   Culture   Final    NO GROWTH 4 DAYS Performed at [facility], [address] [place], [place] [address]    Report Status PENDING  Incomplete     Radiology Studies: No results found. Scheduled Meds:  buprenorphine-naloxone  2 tablet Sublingual Q1200   buprenorphine-naloxone  1 tablet Sublingual BID   cholecalciferol  2,000 Units Oral Daily   enoxaparin (LOVENOX) injection  40 mg Subcutaneous Q24H   nicotine  21 mg Transdermal Daily   pantoprazole  40 mg Oral QPM   polyethylene glycol  17 g Oral Daily   senna-docusate  2 tablet Oral QHS   Continuous Infusions:  vancomycin 1,000 mg (01/05/21 0502)    LOS: 8 days   Time spent: 35 mins   Norm Parcel, MS4 How to contact the [facility] Attending or Consulting provider 7A - 7P or covering provider during after hours 7P -7A, for this patient?  Check the care team in [facility] and look for a) attending/consulting [facility] provider listed and b) the [facility] team listed Log into [email or web address] and use [place]'s universal password to access. If you do not have the password, please contact the hospital operator. Locate the [facility] provider you are looking for under [facility] and page to a number that you can be directly reached. If you still have difficulty reaching the provider, please page the [facility] (Director on Call) for the Hospitalists listed on amion for assistance.  01/05/2021, 1:06 PM   _____________________________________________  ATTENDING NOTE  Patient seen and examined with Norm Parcel, Medical student. In addition to supervising the encounter, I played a key role in the decision making process as well as reviewed key findings.  I agree with the documentation and made changes as needed.  We have been slowly titrating up his suboxone dose with the assistance of the pharmD.  He remains stable at this time but we are focusing on ambulating more.  Repeated BC from 5/12 remain no growth.    Maryln Manuel MD [facility] How to contact the [facility] Attending or Consulting provider 7A - 7P  or covering provider during after hours 7P -7A, for this patient?  Check the care team in [facility] and look for a) attending/consulting [facility] provider listed and b) the [facility] team listed Log into [email or web address] and use [place]'s universal password to access. If you do not have the password, please contact the hospital operator. Locate the [facility] provider you are looking for under [facility] and page to a number that you can be directly reached. If you still have difficulty reaching the provider, please page the [facility] (Director on Call) for the Hospitalists listed on amion for assistance.

## 2021-01-06 ENCOUNTER — Inpatient Hospital Stay (HOSPITAL_COMMUNITY): Payer: Medicaid Other

## 2021-01-06 DIAGNOSIS — J189 Pneumonia, unspecified organism: Secondary | ICD-10-CM | POA: Diagnosis not present

## 2021-01-06 DIAGNOSIS — A419 Sepsis, unspecified organism: Secondary | ICD-10-CM | POA: Diagnosis not present

## 2021-01-06 DIAGNOSIS — I76 Septic arterial embolism: Secondary | ICD-10-CM | POA: Diagnosis not present

## 2021-01-06 DIAGNOSIS — L089 Local infection of the skin and subcutaneous tissue, unspecified: Secondary | ICD-10-CM | POA: Diagnosis not present

## 2021-01-06 DIAGNOSIS — E871 Hypo-osmolality and hyponatremia: Secondary | ICD-10-CM | POA: Diagnosis not present

## 2021-01-06 LAB — CBC WITH DIFFERENTIAL/PLATELET
Abs Immature Granulocytes: 0.16 10*3/uL — ABNORMAL HIGH (ref 0.00–0.07)
Basophils Absolute: 0.1 10*3/uL (ref 0.0–0.1)
Basophils Relative: 1 %
Eosinophils Absolute: 0.3 10*3/uL (ref 0.0–0.5)
Eosinophils Relative: 2 %
HCT: 42.8 % (ref 39.0–52.0)
Hemoglobin: 13.9 g/dL (ref 13.0–17.0)
Immature Granulocytes: 1 %
Lymphocytes Relative: 17 %
Lymphs Abs: 2.2 10*3/uL (ref 0.7–4.0)
MCH: 29.2 pg (ref 26.0–34.0)
MCHC: 32.5 g/dL (ref 30.0–36.0)
MCV: 89.9 fL (ref 80.0–100.0)
Monocytes Absolute: 1 10*3/uL (ref 0.1–1.0)
Monocytes Relative: 8 %
Neutro Abs: 8.9 10*3/uL — ABNORMAL HIGH (ref 1.7–7.7)
Neutrophils Relative %: 71 %
Platelets: 559 10*3/uL — ABNORMAL HIGH (ref 150–400)
RBC: 4.76 MIL/uL (ref 4.22–5.81)
RDW: 14.4 % (ref 11.5–15.5)
WBC: 12.6 10*3/uL — ABNORMAL HIGH (ref 4.0–10.5)
nRBC: 0 % (ref 0.0–0.2)

## 2021-01-06 LAB — RAPID URINE DRUG SCREEN, HOSP PERFORMED
Amphetamines: NOT DETECTED
Barbiturates: NOT DETECTED
Benzodiazepines: NOT DETECTED
Cocaine: NOT DETECTED
Opiates: NOT DETECTED
Tetrahydrocannabinol: NOT DETECTED

## 2021-01-06 LAB — URINALYSIS, ROUTINE W REFLEX MICROSCOPIC
Bilirubin Urine: NEGATIVE
Glucose, UA: NEGATIVE mg/dL
Hgb urine dipstick: NEGATIVE
Ketones, ur: NEGATIVE mg/dL
Leukocytes,Ua: NEGATIVE
Nitrite: NEGATIVE
Protein, ur: NEGATIVE mg/dL
Specific Gravity, Urine: 1.01 (ref 1.005–1.030)
pH: 6 (ref 5.0–8.0)

## 2021-01-06 LAB — CULTURE, BLOOD (ROUTINE X 2)
Culture: NO GROWTH
Culture: NO GROWTH

## 2021-01-06 NOTE — Progress Notes (Addendum)
PROGRESS NOTE   George Lucas  ZOX:[id number] DOB: 03/16/1991 DOA: 12/28/2020 PCP: System, Provider Not In   Chief Complaint  Patient presents with   Chest Pain   Level of care: Med-Surg  Brief Admission History:  30 y.o. male with medical history significant of nicotine dependence, IVDU presented to ED with a complaint of chest pain.  Currently patient, he woke up at 10 o'clock in the morning with the chest pain.  He then had fever of 104 at around 2:30 AM.  He also started having some dry cough.  He arrived to the ED at around 2 PM as he did not have a ride.  No other complaint.  He claims that he has been clean and has not used IVDU in about 2 months but continues to smoke.   ED Course: Upon arrival to ED, he was tachycardic and tachypneic and afebrile.  CBC showed white cells of 17.  BMP showed hyponatremia and hyperglycemia.  Chest x-ray unremarkable however CT angiogram of the chest showed multifocal pneumonia and septic emboli.  Patient received vancomycin in the ED as well as 1 L of IV fluid bolus.  Hospitalist service were consulted to admit the patient for further management for possible bacterial endocarditis.  Assessment & Plan:   Active Problems:   Multifocal pneumonia   Severe sepsis (HCC)   Septic embolism (HCC)   Acute hyponatremia   Bacteremia   Infection of skin  Severe sepsis from MRSA bacteremia, and multifocal pneumonia, and possible cellulitis of L knuckle - Continue IV vancomcycin per ID recommendation, TEE did not show vegetations. Pt had been afebrile since admission, but last night early am 5/17 pt briefly spiked to 101.2 Also, last 3 days (5/15 - 5/17) WBC w/ neutrophilia elevated from 10.7 to 12.4 to 12.2. Blood cultures repeated again on 5/17.   Persistent bacteremia - continue IV vancomycin, repeated BC 5/12 no growth to date. WBC count elevated to 12.2 today 5/17, pt had brief fever to 101.2 around 5am but dropped to 35F within a few hours. Pt denies  fevers or chills but night sweats are worse. Repeat UCx ordered, and UTox ordered re: concern for continued opiate use. Full phys exam (head to toe) demonstrated inflamed tender flat region around L 2nd digit knuckle, with irregular borders, rubor, tenderness, and edema. No signs impetigo, erysipelas, cellulitis, or abscess  Elsewhere as possible infection source given worsening clinical status. New BCx collected today 5/17.  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  ID recommending to keep in hospital for 2 weeks of IV therapy followed by long acting therapy.     Mild R chest pain during inspiration - resolved 5/17, repeat CXR 5/17 w/ no acute processes and is unchanged from prior (full impression below).  HCV - HCV quant elevated to 95k and reflex genotype revealed HCV type 1a. ID following, will inform ID today that genotype returned and get recs regarding medical management.  [facility]    Opioid Dependence - Pt having withdrawal symptoms.  Discussed with pharm D.  Increased suboxone to TID with noontime dose  added on 5/15.  Toradol has completed on 5/15 and ibuprofen ordered for breakthrough symptoms. UTox in process.   Opioid induced constipation - Admitted 5/8, no BMs until 5/13 (per pt, no nursing documentation), took laxatives 5/16 for the first time since admission and this AM 5/17 had BM looser than usual per pt (this was documented). Will continue to encourage laxatives.   DVT prophylaxis: enoxaparin  Code Status: full  Family Communication: wife telephone  update and at bedside Disposition: anticipating at least 2 weeks in hospital for IV antibiotics Status is: Inpatient  Remains inpatient appropriate because:IV treatments appropriate due to intensity of illness or inability to take PO and Inpatient level of care appropriate due to severity of illness  Dispo: The patient is from: Home              Anticipated d/c is to: Home              Patient currently is not medically stable to d/c.   Difficult to place patient No  Consultants:  ID for sepsis and HCV, appreciate recs Cardiology, appreciate recs Pharmacy, following Vanc dosage and titers  Procedures:  TEE on 5/13, no abnormalities or vegetations noted  Antimicrobials:  Vancomycin  5/10>>  Subjective: Pt feels worse today, with increased night sweats per his baseline, brief spike of fever to 101.2, and sweats during the AM while was awake which is uncommon for him. Reports improvement on inspiratory pain on R side, as pt has been following recommendations to ambulate and use inspiratory spirometer. Pt also reports worsening pain of L hand knuckle of index finger, which had started hurting a day before admission but was worse today because any movement of the finger now is extremely painful. Pt describes the pain as burning, neither sharp nor dull, and extremely tender, red, and hot. Despite elevated WBC pt denies feeling febrile, chills, nausea, vomiting, or other concerns.  Objective: Vitals:   01/05/21 1340 01/05/21 1900 01/06/21 0514 01/06/21 0651  BP: 122/77 114/76 118/80   Pulse: 96 90 100   Resp: 18 18 18    Temp: 97.8 F (36.6 C) 98.4 F (36.9 C) (!) 101.2 F (38.4 C) 99 F (37.2 C)  TempSrc: Oral Oral Oral Oral  SpO2: 97% 98% 96%   Weight:      Height:        Intake/Output Summary (Last 24 hours) at 01/06/2021 1424 Last data filed at 01/06/2021 1300 Gross per 24 hour  Intake 960 ml  Output 1 ml  Net 959 ml   Filed Weights   12/28/20 1702  Weight: 81.6 kg     Examination:  General exam: slightly diaphoretic male, awake, alert though more tired than yesterday, appears calm and NAD.   Respiratory system: Clear to auscultation. Respiratory effort normal. Cardiovascular system: normal S1 & S2 heard. No JVD, rubs, gallops or clicks. No pedal edema. Gastrointestinal system: Abdomen is nondistended and soft. RUQ pain to light and deep palpation per baseline according to pt. No splenomegaly but UTA liver margin due to pain. Normal bowel sounds heard. Central nervous system: Alert and oriented. No focal neurological deficits. Extremities: Pt spontaneously moving all extremities with full strength and ROM.  Skin: No rashes, lesions or ulcers. No jaundice, ascites or caput medusa. Full head-to-toe examination of skin revealed L knuckle index finger with circumferential erythema, rubor, swelling, and tenderness around index finger knuckle. No rubor, tenderness, erysipelas, cellulitis, or abscesses noted elsewhere  Psychiatry: Judgement and insight appear poor. Mood &  affect appropriate.   Data Reviewed: I have personally reviewed following labs and imaging studies  CBC: Recent Labs  Lab 01/01/21 0612 01/02/21 0507 01/03/21 0538 01/05/21 0458 01/06/21 0811  WBC 9.8 11.0* 10.7* 12.4* 12.6*  NEUTROABS 6.7 7.6 6.1 8.0* 8.9*  HGB 12.6* 11.8* 12.8* 12.0* 13.9  HCT 36.9* 35.1* 38.7* 36.7* 42.8  MCV 86.4 87.1 88.8 88.4 89.9  PLT 171 238 322 473* 559*    Basic Metabolic Panel: Recent Labs  Lab 12/31/20 0252 01/01/21 0612 01/02/21 0507 01/03/21 0538 01/04/21 0410 01/05/21 0458  NA 131* 131* 135 136  --  134*  K 3.2* 3.0* 3.6 3.5  --  3.9  CL 99 101 102 102  --  100  CO2 --  28  GLUCOSE 113* 158* 116* 108*  --  111*  BUN --  7  CREATININE 0.56* 0.62 0.56* 0.47* 0.47* 0.64  CALCIUM 7.4* 7.1* 7.2* 7.7*  --  7.8*  MG 1.6* 2.0 1.9 1.9  --  2.0    GFR: Estimated Creatinine Clearance: 157.3 mL/min (by C-G formula based on  SCr of 0.64 mg/dL).  Liver Function Tests: Recent Labs  Lab 01/01/21 0612 01/02/21 0507 01/03/21 0538 01/05/21 0458  AST 39 51* 58* 61*  ALT 41 51* 63* 73*  ALKPHOS 120 103 92 78  BILITOT 0.7 0.8 0.8 0.5  PROT 5.5* 5.7* 6.1* 6.9  ALBUMIN 2.1* 2.1* 2.1* 2.1*    CBG: Recent Labs  Lab 01/04/21 0758  GLUCAP 120*    Recent Results (from the past 240 hour(s))  Resp Panel by RT-PCR (Flu A&B, Covid) Nasopharyngeal Swab     Status: None   Collection Time: 12/28/20  2:29 PM   Specimen: Nasopharyngeal Swab; Nasopharyngeal(NP) swabs in vial transport medium  Result Value Ref Range Status   SARS Coronavirus 2 by RT PCR NEGATIVE NEGATIVE Final    Comment: (NOTE) SARS-CoV-2 target nucleic acids are NOT DETECTED.  The SARS-CoV-2 RNA is generally detectable in upper respiratory specimens during the acute phase of infection. The lowest concentration of SARS-CoV-2 viral copies this assay can detect is 138 copies/mL. A negative result does not preclude SARS-Cov-2 infection and should not be used as the sole basis for treatment or other patient management decisions. A negative result may occur with  improper specimen collection/handling, submission of specimen other than nasopharyngeal swab, presence of viral mutation(s) within the areas targeted by this assay, and inadequate number of viral copies(<138 copies/mL). A negative result must be combined with clinical observations, patient history, and epidemiological information. The expected result is Negative.  Fact Sheet for Patients:  BloggerCourse.com  Fact Sheet for Healthcare Providers:  SeriousBroker.it  This test is no t yet approved or cleared by the [place] FDA and  has been authorized for detection and/or diagnosis of SARS-CoV-2 by FDA under an Emergency Use Authorization (EUA). This EUA will remain  in effect (meaning this test can be used) for the duration of  the COVID-19 declaration under Section 564(b)(1) of the Act, 21 U.S.C.section 360bbb-3(b)(1), unless the authorization is terminated  or revoked sooner.       Influenza A by PCR NEGATIVE NEGATIVE Final   Influenza B by PCR NEGATIVE NEGATIVE Final    Comment: (NOTE) The Xpert Xpress SARS-CoV-2/FLU/RSV plus assay is intended as an aid in the diagnosis of influenza from Nasopharyngeal swab specimens and should not be used as a sole basis for treatment. Nasal washings and aspirates  are unacceptable for Xpert Xpress SARS-CoV-2/FLU/RSV testing.  Fact Sheet for Patients: BloggerCourse.com  Fact Sheet for Healthcare Providers: SeriousBroker.it  This test is not yet approved or cleared by the [place] FDA and has been authorized for detection and/or diagnosis of SARS-CoV-2 by FDA under an Emergency Use Authorization (EUA). This EUA will remain in effect (meaning this test can be used) for the duration of the COVID-19 declaration under Section 564(b)(1) of the Act, 21 U.S.C. section 360bbb-3(b)(1), unless the authorization is terminated or revoked.  Performed at [facility], [address] [place], [place] [address]   Blood culture (routine x 2)     Status: Abnormal   Collection Time: 12/28/20  5:21 PM   Specimen: BLOOD RIGHT HAND  Result Value Ref Range Status   Specimen Description   Final    BLOOD RIGHT HAND Performed at [facility], [address], [place], [place] [address]    Special Requests   Final    BOTTLES DRAWN AEROBIC AND ANAEROBIC Blood Culture adequate volume Performed at [facility], [address] [place], [place] [address]    Culture  Setup Time   Final    GRAM POSITIVE COCCI IN BOTH AEROBIC AND ANAEROBIC BOTTLES Gram Stain Report Called to,Read Back By and Verified With: HOWARDTON,M@[id number]  BY MATTHEWS, B 5.9.22 CRITICAL RESULT CALLED TO, READ BACK BY AND VERIFIED WITH: RN Denman George AT 1813 12/29/2020 BY L  BENFIELD Performed at [facility], [address], [place], [place] [address]    Culture METHICILLIN RESISTANT STAPHYLOCOCCUS AUREUS (A)  Final   Report Status 12/31/2020 FINAL  Final   Organism ID, Bacteria METHICILLIN RESISTANT STAPHYLOCOCCUS AUREUS  Final      Susceptibility   Methicillin resistant staphylococcus aureus - MIC*    CIPROFLOXACIN <=0.5 SENSITIVE Sensitive     ERYTHROMYCIN >=8 RESISTANT Resistant     GENTAMICIN <=0.5 SENSITIVE Sensitive     OXACILLIN >=4 RESISTANT Resistant     TETRACYCLINE <=1 SENSITIVE Sensitive     VANCOMYCIN <=0.5 SENSITIVE Sensitive     TRIMETH/SULFA <=10 SENSITIVE Sensitive     CLINDAMYCIN <=0.25 SENSITIVE Sensitive     RIFAMPIN <=0.5 SENSITIVE Sensitive     Inducible Clindamycin NEGATIVE Sensitive     * METHICILLIN RESISTANT STAPHYLOCOCCUS AUREUS  Blood culture (routine x 2)     Status: Abnormal   Collection Time: 12/28/20  5:21 PM   Specimen: BLOOD LEFT HAND  Result Value Ref Range Status   Specimen Description   Final    BLOOD LEFT HAND Performed at [facility], [address] [place], [place] [address]    Special Requests   Final    BOTTLES DRAWN AEROBIC AND ANAEROBIC Blood Culture adequate volume Performed at [facility], [address], [place], [place] [address]    Culture  Setup Time   Final    GRAM POSITIVE COCCI ANAEROBIC AND AEROBIC BOTTLES Gram Stain Report Called to,Read Back By and Verified With: HOWARDTON,M@[id number]  BY MATTHEWS, B 5.9.22 Performed at [facility], [address], [place], [place] [address]    Culture (A)  Final    STAPHYLOCOCCUS AUREUS SUSCEPTIBILITIES PERFORMED ON PREVIOUS CULTURE WITHIN THE LAST 5 DAYS. Performed at [facility], [address] [place], [place] [address]    Report Status 12/31/2020 FINAL  Final  Blood Culture ID Panel (Reflexed)     Status: Abnormal   Collection Time: 12/28/20  5:21 PM  Result Value Ref Range Status   Enterococcus faecalis NOT DETECTED NOT  DETECTED Final    Enterococcus Faecium NOT DETECTED NOT DETECTED Final   Listeria monocytogenes NOT DETECTED NOT DETECTED Final   Staphylococcus species DETECTED (A) NOT DETECTED Final    Comment: CRITICAL RESULT CALLED TO, READ BACK BY AND VERIFIED WITH: RN M HOWERTON AT 1813 12/29/2020 BY L BENFIELD    Staphylococcus aureus (BCID) DETECTED (A) NOT DETECTED Final    Comment: Methicillin (oxacillin)-resistant Staphylococcus aureus (MRSA). MRSA is predictably resistant to beta-lactam antibiotics (except ceftaroline). Preferred therapy is vancomycin unless clinically contraindicated. Patient requires contact precautions if  hospitalized. CRITICAL RESULT CALLED TO, READ BACK BY AND VERIFIED WITH: RN Denman George AT 1813 12/29/2020 BY L BENFIELD    Staphylococcus epidermidis NOT DETECTED NOT DETECTED Final   Staphylococcus lugdunensis NOT DETECTED NOT DETECTED Final   Streptococcus species NOT DETECTED NOT DETECTED Final   Streptococcus agalactiae NOT DETECTED NOT DETECTED Final   Streptococcus pneumoniae NOT DETECTED NOT DETECTED Final   Streptococcus pyogenes NOT DETECTED NOT DETECTED Final   A.calcoaceticus-baumannii NOT DETECTED NOT DETECTED Final   Bacteroides fragilis NOT DETECTED NOT DETECTED Final   Enterobacterales NOT DETECTED NOT DETECTED Final   Enterobacter cloacae complex NOT DETECTED NOT DETECTED Final   Escherichia coli NOT DETECTED NOT DETECTED Final   Klebsiella aerogenes NOT DETECTED NOT DETECTED Final   Klebsiella oxytoca NOT DETECTED NOT DETECTED Final   Klebsiella pneumoniae NOT DETECTED NOT DETECTED Final   Proteus species NOT DETECTED NOT DETECTED Final   Salmonella species NOT DETECTED NOT DETECTED Final   Serratia marcescens NOT DETECTED NOT DETECTED Final   Haemophilus influenzae NOT DETECTED NOT DETECTED Final   Neisseria meningitidis NOT DETECTED NOT DETECTED Final   Pseudomonas aeruginosa NOT DETECTED NOT DETECTED Final   Stenotrophomonas maltophilia NOT DETECTED NOT DETECTED  Final   Candida albicans NOT DETECTED NOT DETECTED Final   Candida auris NOT DETECTED NOT DETECTED Final   Candida glabrata NOT DETECTED NOT DETECTED Final   Candida krusei NOT DETECTED NOT DETECTED Final   Candida parapsilosis NOT DETECTED NOT DETECTED Final   Candida tropicalis NOT DETECTED NOT DETECTED Final   Cryptococcus neoformans/gattii NOT DETECTED NOT DETECTED Final   Meth resistant mecA/C and MREJ DETECTED (A) NOT DETECTED Final    Comment: CRITICAL RESULT CALLED TO, READ BACK BY AND VERIFIED WITH: RN Denman George AT 1813 12/29/2020 BY L BENFIELD Performed at [facility], [address] [place], [place] [address]   Respiratory (~20 pathogens) panel by PCR     Status: None   Collection Time: 12/29/20  7:30 AM   Specimen: Nasopharyngeal Swab; Respiratory  Result Value Ref Range Status   Adenovirus NOT DETECTED NOT DETECTED Final   Coronavirus 229E NOT DETECTED NOT DETECTED Final    Comment: (NOTE) The Coronavirus on the Respiratory Panel, DOES NOT test for the novel  Coronavirus (2019 nCoV)    Coronavirus HKU1 NOT DETECTED NOT DETECTED Final   Coronavirus NL63 NOT DETECTED NOT DETECTED Final   Coronavirus OC43 NOT DETECTED NOT DETECTED Final   Metapneumovirus NOT DETECTED NOT DETECTED Final   Rhinovirus / Enterovirus NOT DETECTED NOT DETECTED Final   Influenza A NOT DETECTED NOT DETECTED Final   Influenza B NOT DETECTED NOT DETECTED Final   Parainfluenza Virus 1 NOT DETECTED NOT DETECTED Final   Parainfluenza Virus 2 NOT DETECTED NOT DETECTED Final   Parainfluenza Virus 3 NOT DETECTED NOT DETECTED Final   Parainfluenza Virus 4 NOT DETECTED NOT DETECTED Final   Respiratory Syncytial Virus NOT DETECTED NOT  DETECTED Final   Bordetella pertussis NOT DETECTED NOT DETECTED Final   Bordetella Parapertussis NOT DETECTED NOT DETECTED Final   Chlamydophila pneumoniae NOT DETECTED NOT DETECTED Final   Mycoplasma pneumoniae NOT DETECTED NOT DETECTED Final    Comment: Performed  at [facility], [address], [place], [place] [address]  MRSA PCR Screening     Status: Abnormal   Collection Time: 12/29/20  7:32 AM   Specimen: Nasal Mucosa; Nasopharyngeal  Result Value Ref Range Status   MRSA by PCR POSITIVE (A) NEGATIVE Final    Comment:        The GeneXpert MRSA Assay (FDA approved for NASAL specimens only), is one component of a comprehensive MRSA colonization surveillance program. It is not intended to diagnose MRSA infection nor to guide or monitor treatment for MRSA infections. RESULT CALLED TO, READ BACK BY AND VERIFIED WITH: HOWERTON M. AT 1018AM ON [id number] BY THOMPSON S. Performed at [facility], [address] [place], [place] [address]   Culture, blood (Routine X 2) w Reflex to ID Panel     Status: Abnormal   Collection Time: 12/29/20  8:35 PM   Specimen: BLOOD RIGHT HAND  Result Value Ref Range Status   Specimen Description   Final    BLOOD RIGHT HAND Performed at [facility], [address], [place], [place] [address]    Special Requests   Final    BOTTLES DRAWN AEROBIC AND ANAEROBIC Blood Culture results may not be optimal due to an inadequate volume of blood received in culture bottles Performed at [facility], [address], [place], [place] [address]    Culture  Setup Time   Final    ANAEROBIC BOTTLE ONLY GRAM POSITIVE COCCI Gram Stain Report Called to,Read Back By and Verified With: RESULTS PREVIOUSLY CALLED RN MARY ANN HAWLERTON  12/30/20 BY JONES,T [facility] Performed at [facility], [address], [place], [place] [address]    Culture (A)  Final    STAPHYLOCOCCUS AUREUS SUSCEPTIBILITIES PERFORMED ON PREVIOUS CULTURE WITHIN THE LAST 5 DAYS. Performed at [facility], [address], [place], [place] [address]    Report Status 01/01/2021 FINAL  Final  Culture, blood (Routine X 2) w Reflex to ID Panel     Status: Abnormal   Collection Time: 12/29/20  8:36 PM   Specimen: BLOOD  Result Value Ref Range Status    Specimen Description   Final    BLOOD RIGHT ANTECUBITAL Performed at [facility], [address] [place], [place] [address]    Special Requests   Final    BOTTLES DRAWN AEROBIC AND ANAEROBIC Blood Culture results may not be optimal due to an inadequate volume of blood received in culture bottles Performed at [facility], [address] [place], [place] [address]    Culture  Setup Time   Final    IN BOTH AEROBIC AND ANAEROBIC BOTTLES GRAM POSITIVE COCCI Gram Stain Report Called to,Read Back By and Verified With: MARY ANN HAWLERTON, RN  12/30/20 BY JONES,T [facility] CRITICAL VALUE NOTED.  VALUE IS CONSISTENT WITH PREVIOUSLY REPORTED AND CALLED VALUE.    Culture (A)  Final    STAPHYLOCOCCUS AUREUS SUSCEPTIBILITIES PERFORMED ON PREVIOUS CULTURE WITHIN THE LAST 5 DAYS. Performed at [facility], [address], [place], [place] [address]    Report Status 01/01/2021 FINAL  Final  Culture, blood (routine x 2)     Status: Abnormal   Collection Time: 12/30/20  4:20 PM  Specimen: BLOOD RIGHT ARM  Result Value Ref Range Status   Specimen Description   Final    BLOOD RIGHT ARM Performed at [facility], [address] [place], [place] [address]    Special Requests   Final    BOTTLES DRAWN AEROBIC AND ANAEROBIC Blood Culture adequate volume Performed at [facility], [address] [place], [place] [address]    Culture  Setup Time   Final    ANAEROBIC BOTTLE ONLY GRAM POSITIVE COCCI Gram Stain Report Called to,Read Back By and Verified With: T HAIRSTON,RN@[id number]  01/01/21 MKELLY CRITICAL VALUE NOTED.  VALUE IS CONSISTENT WITH PREVIOUSLY REPORTED AND CALLED VALUE. AEROBIC BOTTLE  GRAM POSITIVE COCCI PREVIOUSLY CALLED Performed at [facility], [address] [place], [place] [address]    Culture (A)  Final    STAPHYLOCOCCUS AUREUS SUSCEPTIBILITIES PERFORMED ON PREVIOUS CULTURE WITHIN THE LAST 5 DAYS. Performed at [facility], [address], [place], [place] [address]     Report Status 01/04/2021 FINAL  Final  Culture, blood (routine x 2)     Status: Abnormal   Collection Time: 12/30/20  4:20 PM   Specimen: BLOOD LEFT ARM  Result Value Ref Range Status   Specimen Description   Final    BLOOD LEFT ARM Performed at [facility], [address] [place], [place] [address]    Special Requests   Final    BOTTLES DRAWN AEROBIC AND ANAEROBIC Blood Culture adequate volume Performed at [facility], [address] [place], [place] [address]    Culture  Setup Time   Final    GRAM POSITIVE COCCI ANAEROBIC BOTTLE Gram Stain Report Called to,Read Back By and Verified With: P BENGTSON 01/01/21 @ 0857 BY Chauncey Mann Performed at [facility], [address], [place], [place] [address]    Culture (A)  Final    STAPHYLOCOCCUS AUREUS SUSCEPTIBILITIES PERFORMED ON PREVIOUS CULTURE WITHIN THE LAST 5 DAYS. Performed at [facility], [address], [place], [place] [address]    Report Status 01/03/2021 FINAL  Final  Culture, blood (Routine X 2) w Reflex to ID Panel     Status: None   Collection Time: 01/01/21  6:41 PM   Specimen: BLOOD LEFT ARM  Result Value Ref Range Status   Specimen Description BLOOD LEFT ARM  Final   Special Requests   Final    BOTTLES DRAWN AEROBIC AND ANAEROBIC Blood Culture results may not be optimal due to an excessive volume of blood received in culture bottles   Culture   Final    NO GROWTH 5 DAYS Performed at [facility], [address], [place], [place] [address]    Report Status 01/06/2021 FINAL  Final  Culture, blood (Routine X 2) w Reflex to ID Panel     Status: None   Collection Time: 01/01/21  6:41 PM   Specimen: BLOOD RIGHT ARM  Result Value Ref Range Status   Specimen Description BLOOD RIGHT ARM  Final   Special Requests   Final    BOTTLES DRAWN AEROBIC ONLY Blood Culture results may not be optimal due to an inadequate volume of blood received in culture bottles   Culture   Final    NO GROWTH 5 DAYS Performed at [facility], [address] [place], [place] [address]    Report Status 01/06/2021 FINAL  Final     Radiology Studies: DG Chest 2 View  Result Date: 01/06/2021 CLINICAL DATA:  Leukocytosis. EXAM: CHEST - 2 VIEW  COMPARISON:  Jan 03, 2021. FINDINGS: The heart size and mediastinal contours are within normal limits. No pneumothorax is noted. Bilateral pleural effusions are again noted, right greater than left, with associated bibasilar atelectasis. The visualized skeletal structures are unremarkable. IMPRESSION: Continued presence of bilateral pleural effusions, right greater than left, with associated bibasilar atelectasis. Electronically Signed   By: Lupita Raider M.D.   On: 01/06/2021 09:12   Scheduled Meds:  buprenorphine-naloxone  2 tablet Sublingual Q1200   buprenorphine-naloxone  1 tablet Sublingual BID   cholecalciferol  2,000 Units Oral Daily   enoxaparin (LOVENOX) injection  40 mg Subcutaneous Q24H   nicotine  21 mg Transdermal Daily   pantoprazole  40 mg Oral QPM   polyethylene glycol  17 g Oral Daily   senna-docusate  2 tablet Oral QHS   Continuous Infusions:  vancomycin Stopped (01/06/21 0545)    LOS: 9 days   Time spent: 35 mins   Norm Parcel, MS4 How to contact the [facility] Attending or Consulting provider 7A - 7P or covering provider during after hours 7P -7A, for this patient?  Check the care team in [facility] and look for a) attending/consulting TR[facility] provider listed and b) the [facility] team listed Log into [email or web address] and use [place] universal password to access. If you do not have the password, please contact the hospital operator. Locate the [facility] provider you are looking for under [facility] and page to a number that you can be directly reached. If you still have difficulty reaching the provider, please page the [facility] (Director on Call) for the Hospitalists listed on amion for assistance.  01/06/2021, 2:24 PM   _____________________________________________  ATTENDING  NOTE  Patient seen and examined with Norm Parcel, Medical student. In addition to supervising the encounter, I played a key role in the decision making process as well as reviewed key findings.  I agree with the documentation and made changes as needed.  Full head to toe exam completed.  Repeated blood cultures today.  CXR no acute findings.  Continue to ambulate. Check new UA and UDS today. Continue inpatient care for IV antibiotics.      Maryln Manuel MD [facility] How to contact the [facility] Attending or Consulting provider 7A - 7P or covering provider during after hours 7P -7A, for this patient?  Check the care team in [facility] and look for a) attending/consulting TR[facility] provider listed and b) the [facility] team listed Log into [email or web address] and use [place]'s universal password to access. If you do not have the password, please contact the hospital operator. Locate the [facility] provider you are looking for under [facility] and page to a number that you can be directly reached. If you still have difficulty reaching the provider, please page the [facility] (Director on Call) for the Hospitalists listed on amion for assistance.

## 2021-01-07 DIAGNOSIS — L089 Local infection of the skin and subcutaneous tissue, unspecified: Secondary | ICD-10-CM | POA: Diagnosis not present

## 2021-01-07 DIAGNOSIS — J189 Pneumonia, unspecified organism: Secondary | ICD-10-CM | POA: Diagnosis not present

## 2021-01-07 DIAGNOSIS — R7881 Bacteremia: Secondary | ICD-10-CM | POA: Diagnosis not present

## 2021-01-07 DIAGNOSIS — E871 Hypo-osmolality and hyponatremia: Secondary | ICD-10-CM | POA: Diagnosis not present

## 2021-01-07 LAB — CBC WITH DIFFERENTIAL/PLATELET
Abs Immature Granulocytes: 0.13 10*3/uL — ABNORMAL HIGH (ref 0.00–0.07)
Basophils Absolute: 0.1 10*3/uL (ref 0.0–0.1)
Basophils Relative: 1 %
Eosinophils Absolute: 0.4 10*3/uL (ref 0.0–0.5)
Eosinophils Relative: 3 %
HCT: 41 % (ref 39.0–52.0)
Hemoglobin: 13.3 g/dL (ref 13.0–17.0)
Immature Granulocytes: 1 %
Lymphocytes Relative: 26 %
Lymphs Abs: 2.9 10*3/uL (ref 0.7–4.0)
MCH: 29.1 pg (ref 26.0–34.0)
MCHC: 32.4 g/dL (ref 30.0–36.0)
MCV: 89.7 fL (ref 80.0–100.0)
Monocytes Absolute: 1 10*3/uL (ref 0.1–1.0)
Monocytes Relative: 9 %
Neutro Abs: 6.6 10*3/uL (ref 1.7–7.7)
Neutrophils Relative %: 60 %
Platelets: 591 10*3/uL — ABNORMAL HIGH (ref 150–400)
RBC: 4.57 MIL/uL (ref 4.22–5.81)
RDW: 14 % (ref 11.5–15.5)
WBC: 11.1 10*3/uL — ABNORMAL HIGH (ref 4.0–10.5)
nRBC: 0 % (ref 0.0–0.2)

## 2021-01-07 LAB — COMPREHENSIVE METABOLIC PANEL
ALT: 80 U/L — ABNORMAL HIGH (ref 0–44)
AST: 58 U/L — ABNORMAL HIGH (ref 15–41)
Albumin: 2.4 g/dL — ABNORMAL LOW (ref 3.5–5.0)
Alkaline Phosphatase: 77 U/L (ref 38–126)
Anion gap: 8 (ref 5–15)
BUN: 11 mg/dL (ref 6–20)
CO2: 28 mmol/L (ref 22–32)
Calcium: 8.5 mg/dL — ABNORMAL LOW (ref 8.9–10.3)
Chloride: 95 mmol/L — ABNORMAL LOW (ref 98–111)
Creatinine, Ser: 0.55 mg/dL — ABNORMAL LOW (ref 0.61–1.24)
GFR, Estimated: >60 mL/min (ref >60)
Glucose, Bld: 108 mg/dL — ABNORMAL HIGH (ref 70–99)
Potassium: 4 mmol/L (ref 3.5–5.1)
Sodium: 131 mmol/L — ABNORMAL LOW (ref 135–145)
Total Bilirubin: 0.7 mg/dL (ref 0.3–1.2)
Total Protein: 8.3 g/dL — ABNORMAL HIGH (ref 6.5–8.1)

## 2021-01-07 LAB — VANCOMYCIN, TROUGH: Vancomycin Tr: 15 ug/mL (ref 15–20)

## 2021-01-07 LAB — VANCOMYCIN, PEAK: Vancomycin Pk: 23 ug/mL — ABNORMAL LOW (ref 30–40)

## 2021-01-07 MED ORDER — NYSTATIN-TRIAMCINOLONE 100000-0.1 UNIT/GM-% EX OINT
TOPICAL_OINTMENT | Freq: Two times a day (BID) | CUTANEOUS | Status: DC
  Filled 2021-01-07: qty 15

## 2021-01-07 MED ORDER — SODIUM CHLORIDE 0.9 % IV SOLN
INTRAVENOUS | Status: DC | PRN
  Administered 2021-01-07: 1000 mL via INTRAVENOUS

## 2021-01-07 MED ORDER — TRIAMCINOLONE ACETONIDE 0.1 % EX OINT
TOPICAL_OINTMENT | Freq: Two times a day (BID) | CUTANEOUS | Status: DC
  Filled 2021-01-07: qty 15

## 2021-01-07 MED ORDER — NYSTATIN 100000 UNIT/GM EX OINT
TOPICAL_OINTMENT | Freq: Two times a day (BID) | CUTANEOUS | Status: DC
  Filled 2021-01-07: qty 15

## 2021-01-07 NOTE — Progress Notes (Signed)
Pharmacy Antibiotic Note  George Lucas is a 30 y.o. 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  Pharmacy has been consulted for vancomycin dosing.    Plan: Calculated Vanco AUC: 490. Therapeutic range. Continue vancomycin 1000mg  IV every 8 hours F/U cxs and clinical progress Monitor V/S, labs and levels as indicated  Height: 6\' 2"  (188 cm) Weight: 81.6 kg (180 lb) IBW/kg (Calculated) : 82.2  Temp (24hrs), Avg:98.5 F (36.9 C), Min:97.7 F (36.5 C), Max:99.2 F (37.3 C)  Recent Labs  Lab 01/02/21 0507 01/03/21 0538 01/04/21 0410 01/05/21 0458 01/06/21 0811 01/07/21 0056 01/07/21 0525  WBC 11.0* 10.7*  --  12.4* 12.6*  --  11.1*  CREATININE 0.56* 0.47* 0.47* 0.64  --   --  0.55*  VANCOTROUGH  --   --   --   --   --   --  15  VANCOPEAK  --   --   --   --   --  23*  --     Estimated Creatinine Clearance: 157.3 mL/min (A) (by C-G formula based on SCr of 0.55 mg/dL (L)).    Allergies  Allergen Reactions  . Sulfa Antibiotics Anaphylaxis and Rash    Antimicrobials this admission:  Vancomycin 5/8 >>  5/8 cefepime >> 5/9  Microbiology results: 5/17 BCx: ngtd 5/8 BCx: MRSA  5/9 MRSA PCR positive 5/9 BCX: MRSA   Thank you for allowing pharmacy to be a part of this patient's care.  7/9, PharmD Clinical Pharmacist 01/07/2021 10:49 AM   01/07/2021 10:47 AM

## 2021-01-07 NOTE — Progress Notes (Signed)
PROGRESS NOTE   George Lucas  BUL:[id number] DOB: 04/08/1991 DOA: 12/28/2020 PCP: System, Provider Not In   Chief Complaint  Patient presents with  . Chest Pain   Level of care: Med-Surg  Brief Admission History:  30 y.o. male with medical history significant of nicotine dependence, IVDU presented to ED with a complaint of chest pain.  Currently patient, he woke up at 10 o'clock in the morning with the chest pain.  He then had fever of 104 at around 2:30 AM.  He also started having some dry cough.  He arrived to the ED at around 2 PM as he did not have a ride.  No other complaint.  He claims that he has been clean and has not used IVDU in about 2 months but continues to smoke.   ED Course: Upon arrival to ED, he was tachycardic and tachypneic and afebrile.  CBC showed white cells of 17.  BMP showed hyponatremia and hyperglycemia.  Chest x-ray unremarkable however CT angiogram of the chest showed multifocal pneumonia and septic emboli.  Patient received vancomycin in the ED as well as 1 L of IV fluid bolus.  Hospitalist service were consulted to admit the patient for further management for possible bacterial endocarditis.  Assessment & Plan:   Active Problems:   Multifocal pneumonia   Severe sepsis (HCC)   Septic embolism (HCC)   Acute hyponatremia   Bacteremia   Infection of skin  Severe sepsis from MRSA bacteremia, and multifocal pneumonia with Bil Pleural Effusions (Rt> Lt), and   cellulitis of L knuckle -- - Continue IV vancomcycin per ID recommendation, TEE did not show vegetations.  --Pt  Spiked fever up  to 101.2 on 01/06/21, -Otherwise has been afebrile -Blood cultures from 12/28/2020, 12/29/2020 and 12/30/2020 all grew MRSA -Repeat blood cultures from 01/01/2021 and 01/06/2021 NGTD   Skin infection of L 2nd digit knuckle - Pt reports it had been hurting slightly a day before admission - currently erythematous, swollen, and flat with rubor, diffuse borders, significant warmth and  swelling circumferentially around entire knuckle most c/w cellulitis, - Conference with ID--continue IV Vanco  Hyponatremia/Hypochloremia- improving  Hypokalemia - resolved    History of IV drug abuse -  ID recommending to keep in hospital for 2 weeks of IV therapy followed by long acting therapy.     Mild R chest pain during inspiration - resolved 01/06/21, repeat CXR 01/06/21 w/ bil pleural effusions Rt > Lt  HCV - HCV quant elevated to 95k and reflex genotype revealed HCV type 1a. ID following,  -outpatient ID follow up at the [facility] advised  Opioid Dependence - Pt was having withdrawal symptoms.  Discussed with pharm D.  Increased suboxone to TID with noontime dose added on 5/15.  Toradol has completed on 5/15 and ibuprofen ordered for breakthrough symptoms.   Opioid induced constipation C/n  laxatives.   DVT prophylaxis: enoxaparin  Code Status: full  Family Communication:  Updated wife at bedside Disposition: -ID recommending to keep in hospital for 2 weeks of IV therapy followed by long acting therapy.  Status is: Inpatient  Remains inpatient appropriate because:ID recommending to keep in hospital for 2 weeks of IV therapy followed by long acting therapy.   Dispo: The patient is from: Home              Anticipated d/c is to: Home              Patient currently is not medically stable to  d/c.   Difficult to place patient No  Consultants:   ID for sepsis and HCV, appreciate recs  Cardiology, appreciate recs  Pharmacy, following Vanc dosage and titers  Procedures:   TEE on 5/13, no abnormalities or vegetations noted  Antimicrobials:  Vancomycin  5/10>>  Subjective: Pt feels worse today, with increased night sweats per his baseline, brief spike of fever to 101.2, and sweats during the AM while was awake which is uncommon for him. Reports improvement on inspiratory pain on R side, as pt has been following recommendations to ambulate and use inspiratory spirometer.  Pt also reports worsening pain of L hand knuckle of index finger, which had started hurting a day before admission but was worse today because any movement of the finger now is extremely painful. Pt describes the pain as burning, neither sharp nor dull, and extremely tender, red, and hot. Despite elevated WBC pt denies feeling febrile, chills, nausea, vomiting, or other concerns.  Objective: Vitals:   01/06/21 0651 01/06/21 2200 01/07/21 0434 01/07/21 1235  BP:  120/73 110/77 113/78  Pulse:  (!) 102 94 (!) 102  Resp:  Temp: 99 F (37.2 C) 99.2 F (37.3 C) 97.7 F (36.5 C) 98 F (36.7 C)  TempSrc: Oral Oral  Oral  SpO2:  99% 96% 97%  Weight:      Height:        Intake/Output Summary (Last 24 hours) at 01/07/2021 1433 Last data filed at 01/07/2021 1300 Gross per 24 hour  Intake 1520 ml  Output --  Net 1520 ml   Filed Weights   12/28/20 1702  Weight: 81.6 kg    Examination:  General exam: Awake, alert, no acute distress Respiratory system: Diminished breath sounds, right more than left, no wheezing  cardiovascular system: normal S1 & S2 heard. No JVD, rubs, gallops or clicks.   Gastrointestinal system: Abdomen is nondistended and soft. . Normal bowel sounds heard. Central nervous system: Alert and oriented. No focal neurological deficits. Extremities: Pt spontaneously moving all extremities with full strength and ROM.  Skin: Rt upper inner thigh/groin area rash - L knuckle index finger with improved erythema, rubor, swelling, and tenderness around index finger knuckle.  Psychiatry: Judgement and insight appear poor. Mood & affect appropriate.   Data Reviewed: I have personally reviewed following labs and imaging studies  CBC: Recent Labs  Lab 01/02/21 0507 01/03/21 0538 01/05/21 0458 01/06/21 0811 01/07/21 0525  WBC 11.0* 10.7* 12.4* 12.6* 11.1*  NEUTROABS 7.6 6.1 8.0* 8.9* 6.6  HGB 11.8* 12.8* 12.0* 13.9 13.3  HCT 35.1* 38.7* 36.7* 42.8 41.0  MCV 87.1  88.8 88.4 89.9 89.7  PLT 238 322 473* 559* 591*    Basic Metabolic Panel: Recent Labs  Lab 01/01/21 0612 01/02/21 0507 01/03/21 0538 01/04/21 0410 01/05/21 0458 01/07/21 0525  NA 131* 135 136  --  134* 131*  K 3.0* 3.6 3.5  --  3.9 4.0  CL 101 102 102  --  100 95*  CO2 --  28 28  GLUCOSE 158* 116* 108*  --  111* 108*  BUN --  7 11  CREATININE 0.62 0.56* 0.47* 0.47* 0.64 0.55*  CALCIUM 7.1* 7.2* 7.7*  --  7.8* 8.5*  MG 2.0 1.9 1.9  --  2.0  --     GFR: Estimated Creatinine Clearance: 157.3 mL/min (A) (by C-G formula based on SCr of 0.55 mg/dL (L)).  Liver Function Tests: Recent Labs  Lab 01/01/21 0612 01/02/21 0507 01/03/21 0538 01/05/21 0458 01/07/21 0525  AST 39 51* 58* 61* 58*  ALT 41 51* 63* 73* 80*  ALKPHOS 120 103 92 78 77  BILITOT 0.7 0.8 0.8 0.5 0.7  PROT 5.5* 5.7* 6.1* 6.9 8.3*  ALBUMIN 2.1* 2.1* 2.1* 2.1* 2.4*    CBG: Recent Labs  Lab 01/04/21 0758  GLUCAP 120*    Recent Results (from the past 240 hour(s))  Blood culture (routine x 2)     Status: Abnormal   Collection Time: 12/28/20  5:21 PM   Specimen: BLOOD RIGHT HAND  Result Value Ref Range Status   Specimen Description   Final    BLOOD RIGHT HAND Performed at [facility], [address], [place], [place] [address]    Special Requests   Final    BOTTLES DRAWN AEROBIC AND ANAEROBIC Blood Culture adequate volume Performed at [facility], [address] [place], [place] [address]    Culture  Setup Time   Final    GRAM POSITIVE COCCI IN BOTH AEROBIC AND ANAEROBIC BOTTLES Gram Stain Report Called to,Read Back By and Verified With: HOWARDTON,M@0727  BY MATTHEWS, B 5.9.22 CRITICAL RESULT CALLED TO, READ BACK BY AND VERIFIED WITH: RN Denman George AT 1813 12/29/2020 BY L BENFIELD Performed at [facility], [address] [place], [place] [address]    Culture METHICILLIN RESISTANT STAPHYLOCOCCUS AUREUS (A)  Final   Report Status 12/31/2020 FINAL  Final   Organism ID,  Bacteria METHICILLIN RESISTANT STAPHYLOCOCCUS AUREUS  Final      Susceptibility   Methicillin resistant staphylococcus aureus - MIC*    CIPROFLOXACIN <=0.5 SENSITIVE Sensitive     ERYTHROMYCIN >=8 RESISTANT Resistant     GENTAMICIN <=0.5 SENSITIVE Sensitive     OXACILLIN >=4 RESISTANT Resistant     TETRACYCLINE <=1 SENSITIVE Sensitive     VANCOMYCIN <=0.5 SENSITIVE Sensitive     TRIMETH/SULFA <=10 SENSITIVE Sensitive     CLINDAMYCIN <=0.25 SENSITIVE Sensitive     RIFAMPIN <=0.5 SENSITIVE Sensitive     Inducible Clindamycin NEGATIVE Sensitive     * METHICILLIN RESISTANT STAPHYLOCOCCUS AUREUS  Blood culture (routine x 2)     Status: Abnormal   Collection Time: 12/28/20  5:21 PM   Specimen: BLOOD LEFT HAND  Result Value Ref Range Status   Specimen Description   Final    BLOOD LEFT HAND Performed at [facility], [address] [place], [place] [address]    Special Requests   Final    BOTTLES DRAWN AEROBIC AND ANAEROBIC Blood Culture adequate volume Performed at [facility], [address], [place], [place] [address]    Culture  Setup Time   Final    GRAM POSITIVE COCCI ANAEROBIC AND AEROBIC BOTTLES Gram Stain Report Called to,Read Back By and Verified With: HOWARDTON,M@0727  BY MATTHEWS, B 5.9.22 Performed at [facility], [address], [place], [place] [address]    Culture (A)  Final    STAPHYLOCOCCUS AUREUS SUSCEPTIBILITIES PERFORMED ON PREVIOUS CULTURE WITHIN THE LAST 5 DAYS. Performed at [facility], [address] [place], [place] [address]    Report Status 12/31/2020 FINAL  Final  Blood Culture ID Panel (Reflexed)     Status: Abnormal   Collection Time: 12/28/20  5:21 PM  Result Value Ref Range Status   Enterococcus faecalis NOT DETECTED NOT DETECTED Final   Enterococcus Faecium NOT DETECTED NOT DETECTED Final   Listeria monocytogenes NOT DETECTED NOT DETECTED Final   Staphylococcus species DETECTED (  A) NOT DETECTED Final    Comment: CRITICAL RESULT CALLED TO,  READ BACK BY AND VERIFIED WITH: RN M HOWERTON AT 1813 12/29/2020 BY L BENFIELD    Staphylococcus aureus (BCID) DETECTED (A) NOT DETECTED Final    Comment: Methicillin (oxacillin)-resistant Staphylococcus aureus (MRSA). MRSA is predictably resistant to beta-lactam antibiotics (except ceftaroline). Preferred therapy is vancomycin unless clinically contraindicated. Patient requires contact precautions if  hospitalized. CRITICAL RESULT CALLED TO, READ BACK BY AND VERIFIED WITH: RN Denman GeorgeM HOWERTON AT 1813 12/29/2020 BY L BENFIELD    Staphylococcus epidermidis NOT DETECTED NOT DETECTED Final   Staphylococcus lugdunensis NOT DETECTED NOT DETECTED Final   Streptococcus species NOT DETECTED NOT DETECTED Final   Streptococcus agalactiae NOT DETECTED NOT DETECTED Final   Streptococcus pneumoniae NOT DETECTED NOT DETECTED Final   Streptococcus pyogenes NOT DETECTED NOT DETECTED Final   A.calcoaceticus-baumannii NOT DETECTED NOT DETECTED Final   Bacteroides fragilis NOT DETECTED NOT DETECTED Final   Enterobacterales NOT DETECTED NOT DETECTED Final   Enterobacter cloacae complex NOT DETECTED NOT DETECTED Final   Escherichia coli NOT DETECTED NOT DETECTED Final   Klebsiella aerogenes NOT DETECTED NOT DETECTED Final   Klebsiella oxytoca NOT DETECTED NOT DETECTED Final   Klebsiella pneumoniae NOT DETECTED NOT DETECTED Final   Proteus species NOT DETECTED NOT DETECTED Final   Salmonella species NOT DETECTED NOT DETECTED Final   Serratia marcescens NOT DETECTED NOT DETECTED Final   Haemophilus influenzae NOT DETECTED NOT DETECTED Final   Neisseria meningitidis NOT DETECTED NOT DETECTED Final   Pseudomonas aeruginosa NOT DETECTED NOT DETECTED Final   Stenotrophomonas maltophilia NOT DETECTED NOT DETECTED Final   Candida albicans NOT DETECTED NOT DETECTED Final   Candida auris NOT DETECTED NOT DETECTED Final   Candida glabrata NOT DETECTED NOT DETECTED Final   Candida krusei NOT DETECTED NOT DETECTED Final    Candida parapsilosis NOT DETECTED NOT DETECTED Final   Candida tropicalis NOT DETECTED NOT DETECTED Final   Cryptococcus neoformans/gattii NOT DETECTED NOT DETECTED Final   Meth resistant mecA/C and MREJ DETECTED (A) NOT DETECTED Final    Comment: CRITICAL RESULT CALLED TO, READ BACK BY AND VERIFIED WITH: RN Denman GeorgeM HOWERTON AT 1813 12/29/2020 BY L BENFIELD Performed at [facility], [address] [place], [place] [address]   Respiratory (~20 pathogens) panel by PCR     Status: None   Collection Time: 12/29/20  7:30 AM   Specimen: Nasopharyngeal Swab; Respiratory  Result Value Ref Range Status   Adenovirus NOT DETECTED NOT DETECTED Final   Coronavirus 229E NOT DETECTED NOT DETECTED Final    Comment: (NOTE) The Coronavirus on the Respiratory Panel, DOES NOT test for the novel  Coronavirus (2019 nCoV)    Coronavirus HKU1 NOT DETECTED NOT DETECTED Final   Coronavirus NL63 NOT DETECTED NOT DETECTED Final   Coronavirus OC43 NOT DETECTED NOT DETECTED Final   Metapneumovirus NOT DETECTED NOT DETECTED Final   Rhinovirus / Enterovirus NOT DETECTED NOT DETECTED Final   Influenza A NOT DETECTED NOT DETECTED Final   Influenza B NOT DETECTED NOT DETECTED Final   Parainfluenza Virus 1 NOT DETECTED NOT DETECTED Final   Parainfluenza Virus 2 NOT DETECTED NOT DETECTED Final   Parainfluenza Virus 3 NOT DETECTED NOT DETECTED Final   Parainfluenza Virus 4 NOT DETECTED NOT DETECTED Final   Respiratory Syncytial Virus NOT DETECTED NOT DETECTED Final   Bordetella pertussis NOT DETECTED NOT DETECTED Final   Bordetella Parapertussis NOT DETECTED NOT DETECTED Final   Chlamydophila pneumoniae NOT  DETECTED NOT DETECTED Final   Mycoplasma pneumoniae NOT DETECTED NOT DETECTED Final    Comment: Performed at [facility], [address], [place], [place] [address]  MRSA PCR Screening     Status: Abnormal   Collection Time: 12/29/20  7:32 AM   Specimen: Nasal Mucosa; Nasopharyngeal  Result Value Ref Range  Status   MRSA by PCR POSITIVE (A) NEGATIVE Final    Comment:        The GeneXpert MRSA Assay (FDA approved for NASAL specimens only), is one component of a comprehensive MRSA colonization surveillance program. It is not intended to diagnose MRSA infection nor to guide or monitor treatment for MRSA infections. RESULT CALLED TO, READ BACK BY AND VERIFIED WITH: HOWERTON M. AT 1018AM ON [id number] BY THOMPSON S. Performed at [facility], [address], [place], [place] [address]   Culture, blood (Routine X 2) w Reflex to ID Panel     Status: Abnormal   Collection Time: 12/29/20  8:35 PM   Specimen: BLOOD RIGHT HAND  Result Value Ref Range Status   Specimen Description   Final    BLOOD RIGHT HAND Performed at [facility], [address], [place], [place] [address]    Special Requests   Final    BOTTLES DRAWN AEROBIC AND ANAEROBIC Blood Culture results may not be optimal due to an inadequate volume of blood received in culture bottles Performed at [facility], [address], [place], [place] [address]    Culture  Setup Time   Final    ANAEROBIC BOTTLE ONLY GRAM POSITIVE COCCI Gram Stain Report Called to,Read Back By and Verified With: RESULTS PREVIOUSLY CALLED RN MARY ANN HAWLERTON @1324  12/30/20 BY JONES,T [facility] Performed at [facility], [address], [place], [place]    Culture (A)  Final    STAPHYLOCOCCUS AUREUS SUSCEPTIBILITIES PERFORMED ON PREVIOUS CULTURE WITHIN THE LAST 5 DAYS. Performed at [facility], [address] [place], [place]    Report Status 01/01/2021 FINAL  Final  Culture, blood (Routine X 2) w Reflex to ID Panel     Status: Abnormal   Collection Time: 12/29/20  8:36 PM   Specimen: BLOOD  Result Value Ref Range Status   Specimen Description   Final    BLOOD RIGHT ANTECUBITAL Performed at [facility], [address] [place], [place]    Special Requests   Final    BOTTLES DRAWN AEROBIC AND ANAEROBIC Blood Culture results  may not be optimal due to an inadequate volume of blood received in culture bottles Performed at [facility], [address] [place], [place]    Culture  Setup Time   Final    IN BOTH AEROBIC AND ANAEROBIC BOTTLES GRAM POSITIVE COCCI Gram Stain Report Called to,Read Back By and Verified With: MARY ANN HAWLERTON, RN @1354  12/30/20 BY JONES,T [facility] CRITICAL VALUE NOTED.  VALUE IS CONSISTENT WITH PREVIOUSLY REPORTED AND CALLED VALUE.    Culture (A)  Final    STAPHYLOCOCCUS AUREUS SUSCEPTIBILITIES PERFORMED ON PREVIOUS CULTURE WITHIN THE LAST 5 DAYS. Performed at [facility], [address] [place], [address] [place]    Report Status 01/01/2021 FINAL  Final  Culture, blood (routine x 2)     Status: Abnormal   Collection Time: 12/30/20  4:20 PM   Specimen: BLOOD RIGHT ARM  Result Value Ref Range Status   Specimen Description   Final    BLOOD RIGHT ARM Performed at  [facility], [address], [place], [place] [address]    Special Requests   Final    BOTTLES DRAWN AEROBIC AND ANAEROBIC Blood Culture adequate volume Performed at [facility], [address], [place], [place] [address]    Culture  Setup Time   Final    ANAEROBIC BOTTLE ONLY GRAM POSITIVE COCCI Gram Stain Report Called to,Read Back By and Verified With: T HAIRSTON,RN@0307  01/01/21 MKELLY CRITICAL VALUE NOTED.  VALUE IS CONSISTENT WITH PREVIOUSLY REPORTED AND CALLED VALUE. AEROBIC BOTTLE  GRAM POSITIVE COCCI PREVIOUSLY CALLED Performed at [facility], [address] [place], [place] [address]    Culture (A)  Final    STAPHYLOCOCCUS AUREUS SUSCEPTIBILITIES PERFORMED ON PREVIOUS CULTURE WITHIN THE LAST 5 DAYS. Performed at [facility], [address], [place], [place] [address]    Report Status 01/04/2021 FINAL  Final  Culture, blood (routine x 2)     Status: Abnormal   Collection Time: 12/30/20  4:20 PM   Specimen: BLOOD LEFT ARM  Result Value Ref Range Status   Specimen Description   Final     BLOOD LEFT ARM Performed at [facility], [address], [place], [place] [address]    Special Requests   Final    BOTTLES DRAWN AEROBIC AND ANAEROBIC Blood Culture adequate volume Performed at [facility], [address] [place], [place] [address]    Culture  Setup Time   Final    GRAM POSITIVE COCCI ANAEROBIC BOTTLE Gram Stain Report Called to,Read Back By and Verified With: P BENGTSON 01/01/21 @ 0857 BY Chauncey Mann Performed at [facility], [address], [place], [place] [address]    Culture (A)  Final    STAPHYLOCOCCUS AUREUS SUSCEPTIBILITIES PERFORMED ON PREVIOUS CULTURE WITHIN THE LAST 5 DAYS. Performed at [facility], [address], [place], [place] [address]    Report Status 01/03/2021 FINAL  Final  Culture, blood (Routine X 2) w Reflex to ID Panel     Status: None   Collection Time: 01/01/21  6:41 PM   Specimen: BLOOD LEFT ARM  Result Value Ref Range Status   Specimen Description BLOOD LEFT ARM  Final   Special Requests   Final    BOTTLES DRAWN AEROBIC AND ANAEROBIC Blood Culture results may not be optimal due to an excessive volume of blood received in culture bottles   Culture   Final    NO GROWTH 5 DAYS Performed at [facility], [address], [place], [place] [address]    Report Status 01/06/2021 FINAL  Final  Culture, blood (Routine X 2) w Reflex to ID Panel     Status: None   Collection Time: 01/01/21  6:41 PM   Specimen: BLOOD RIGHT ARM  Result Value Ref Range Status   Specimen Description BLOOD RIGHT ARM  Final   Special Requests   Final    BOTTLES DRAWN AEROBIC ONLY Blood Culture results may not be optimal due to an inadequate volume of blood received in culture bottles   Culture   Final    NO GROWTH 5 DAYS Performed at [facility], [address] [place], [place] [address]    Report Status 01/06/2021 FINAL  Final  Culture, blood (Routine X 2) w Reflex to ID Panel     Status: None (Preliminary result)   Collection Time: 01/06/21 11:07 AM    Specimen: BLOOD  Result Value Ref Range Status   Specimen Description BLOOD  Final   Special Requests NONE  Final   Culture   Final    NO GROWTH < 24 HOURS Performed at [facility], [address] [place], [place] [address]    Report Status PENDING  Incomplete  Culture, blood (Routine X 2) w Reflex to ID Panel     Status: None (Preliminary result)   Collection Time: 01/06/21 11:07 AM   Specimen: BLOOD  Result Value Ref Range Status   Specimen Description BLOOD  Final   Special Requests NONE  Final   Culture   Final    NO GROWTH < 24 HOURS Performed at [facility], [address] [place], [place] [address]    Report Status PENDING  Incomplete     Radiology Studies: DG Chest 2 View  Result Date: 01/06/2021 CLINICAL DATA:  Leukocytosis. EXAM: CHEST - 2 VIEW COMPARISON:  Jan 03, 2021. FINDINGS: The heart size and mediastinal contours are within normal limits. No pneumothorax is noted. Bilateral pleural effusions are again noted, right greater than left, with associated bibasilar atelectasis. The visualized skeletal structures are unremarkable. IMPRESSION: Continued presence of bilateral pleural effusions, right greater than left, with associated bibasilar atelectasis. Electronically Signed   By: Lupita Raider M.D.   On: 01/06/2021 09:12   Scheduled Meds: . buprenorphine-naloxone  2 tablet Sublingual Q1200  . buprenorphine-naloxone  1 tablet Sublingual BID  . cholecalciferol  2,000 Units Oral Daily  . enoxaparin (LOVENOX) injection  40 mg Subcutaneous Q24H  . nicotine  21 mg Transdermal Daily  . pantoprazole  40 mg Oral QPM  . polyethylene glycol  17 g Oral Daily  . senna-docusate  2 tablet Oral QHS   Continuous Infusions: . vancomycin 1,000 mg (01/07/21 0732)    LOS: 10 days   Time spent: 35 mins   Shon Hale, MD How to contact the [facility] Attending or Consulting provider 7A - 7P or covering provider during after hours 7P -7A, for this patient?  1. Check the care team  in [facility] and look for a) attending/consulting [facility] provider listed and b) the [facility] team listed 2. Log into [email or web address] and use [place]'s universal password to access. If you do not have the password, please contact the hospital operator. 3. Locate the [facility] provider you are looking for under [facility] and page to a number that you can be directly reached. 4. If you still have difficulty reaching the provider, please page the [facility] (Director on Call) for the Hospitalists listed on amion for assistance.  01/07/2021, 2:33 PM   _____________________________________________

## 2021-01-08 DIAGNOSIS — R7881 Bacteremia: Secondary | ICD-10-CM | POA: Diagnosis not present

## 2021-01-08 DIAGNOSIS — E871 Hypo-osmolality and hyponatremia: Secondary | ICD-10-CM | POA: Diagnosis not present

## 2021-01-08 DIAGNOSIS — I76 Septic arterial embolism: Secondary | ICD-10-CM | POA: Diagnosis not present

## 2021-01-08 DIAGNOSIS — L089 Local infection of the skin and subcutaneous tissue, unspecified: Secondary | ICD-10-CM | POA: Diagnosis not present

## 2021-01-08 LAB — CBC WITH DIFFERENTIAL/PLATELET
Abs Immature Granulocytes: 0.08 10*3/uL — ABNORMAL HIGH (ref 0.00–0.07)
Basophils Absolute: 0.1 10*3/uL (ref 0.0–0.1)
Basophils Relative: 1 %
Eosinophils Absolute: 0.4 10*3/uL (ref 0.0–0.5)
Eosinophils Relative: 4 %
HCT: 38.1 % — ABNORMAL LOW (ref 39.0–52.0)
Hemoglobin: 12.2 g/dL — ABNORMAL LOW (ref 13.0–17.0)
Immature Granulocytes: 1 %
Lymphocytes Relative: 30 %
Lymphs Abs: 2.9 10*3/uL (ref 0.7–4.0)
MCH: 29.1 pg (ref 26.0–34.0)
MCHC: 32 g/dL (ref 30.0–36.0)
MCV: 90.9 fL (ref 80.0–100.0)
Monocytes Absolute: 1 10*3/uL (ref 0.1–1.0)
Monocytes Relative: 11 %
Neutro Abs: 5.2 10*3/uL (ref 1.7–7.7)
Neutrophils Relative %: 53 %
Platelets: 612 10*3/uL — ABNORMAL HIGH (ref 150–400)
RBC: 4.19 MIL/uL — ABNORMAL LOW (ref 4.22–5.81)
RDW: 13.6 % (ref 11.5–15.5)
WBC: 9.7 10*3/uL (ref 4.0–10.5)
nRBC: 0 % (ref 0.0–0.2)

## 2021-01-08 LAB — COMPREHENSIVE METABOLIC PANEL
ALT: 103 U/L — ABNORMAL HIGH (ref 0–44)
AST: 82 U/L — ABNORMAL HIGH (ref 15–41)
Albumin: 2.4 g/dL — ABNORMAL LOW (ref 3.5–5.0)
Alkaline Phosphatase: 74 U/L (ref 38–126)
Anion gap: 8 (ref 5–15)
BUN: 10 mg/dL (ref 6–20)
CO2: 31 mmol/L (ref 22–32)
Calcium: 8.5 mg/dL — ABNORMAL LOW (ref 8.9–10.3)
Chloride: 94 mmol/L — ABNORMAL LOW (ref 98–111)
Creatinine, Ser: 0.65 mg/dL (ref 0.61–1.24)
GFR, Estimated: >60 mL/min (ref >60)
Glucose, Bld: 104 mg/dL — ABNORMAL HIGH (ref 70–99)
Potassium: 4 mmol/L (ref 3.5–5.1)
Sodium: 133 mmol/L — ABNORMAL LOW (ref 135–145)
Total Bilirubin: 0.6 mg/dL (ref 0.3–1.2)
Total Protein: 7.9 g/dL (ref 6.5–8.1)

## 2021-01-08 NOTE — Progress Notes (Signed)
PROGRESS NOTE   George Lucas  AVW:[id number] DOB: 1991-06-30 DOA: 12/28/2020 PCP: System, Provider Not In   Chief Complaint  Patient presents with  . Chest Pain   Level of care: Med-Surg  Brief Admission History:  30 y.o. male with medical history significant of nicotine dependence, IVDU presented to ED with a complaint of chest pain.  Currently patient, he woke up at 10 o'clock in the morning with the chest pain.  He then had fever of 104 at around 2:30 AM.  He also started having some dry cough.  He arrived to the ED at around 2 PM as he did not have a ride.  No other complaint.  He claims that he has been clean and has not used IVDU in about 2 months but continues to smoke.   ED Course: Upon arrival to ED, he was tachycardic and tachypneic and afebrile.  CBC showed white cells of 17.  BMP showed hyponatremia and hyperglycemia.  Chest x-ray unremarkable however CT angiogram of the chest showed multifocal pneumonia and septic emboli.  Patient received vancomycin in the ED as well as 1 L of IV fluid bolus.  Hospitalist service were consulted to admit the patient for further management for possible bacterial endocarditis.  Assessment & Plan:   Active Problems:   Multifocal pneumonia   Severe sepsis (HCC)   Septic embolism (HCC)   Acute hyponatremia   Bacteremia   Infection of skin  1)Severe sepsis from MRSA bacteremia, and multifocal pneumonia with Bil Pleural Effusions (Rt> Lt), and   cellulitis of L knuckle -- - Continue IV vancomcycin per ID recommendation, TEE did not show vegetations.  --Pt  Spiked fever up  to 101.2 on 01/06/21, -Otherwise has been afebrile -Blood cultures from 12/28/2020, 12/29/2020 and 12/30/2020 all grew MRSA -Repeat blood cultures from 01/01/2021 and 01/06/2021 NGTD -ID recommends inpatient IV vancomycin administration for 2 weeks from  date of first negative culture (01/01/21) -Leukocytosis has resolved as of 01/08/2021  2)Skin infection of L 2nd digit knuckle -  Pt reports it had been hurting slightly a day before admission - currently erythematous, swollen, and flat with rubor, diffuse borders, significant warmth and swelling circumferentially around entire knuckle most c/w cellulitis, - Conference with ID--continue IV Vanco as above #1  3)Hyponatremia/Hypochloremia- improving  4)Hypokalemia - resolved    5)History of IV drug abuse -  ID recommending to keep in hospital for 2 weeks of IV therapy followed by long acting therapy.     6)Mild R chest pain during inspiration - resolved 01/06/21, repeat CXR 01/06/21 w/ bil pleural effusions Rt > Lt  7)HCV - HCV quant elevated to 95k and reflex genotype revealed HCV type 1a. ID following,  -outpatient ID follow up at the [facility] advised  8)Opioid Dependence - Pt was having withdrawal symptoms.  Discussed with pharm D.  Increased suboxone to TID with noontime dose added on 5/15.  Toradol has completed on 5/15 and ibuprofen ordered for breakthrough symptoms.   9)Opioid induced constipation C/n  laxatives.  10)Acute Anemia -in the setting of acute infection and IV hydration -Hemoglobin currently stable around 12 -No evidence of ongoing bleeding  DVT prophylaxis: enoxaparin  Code Status: full  Family Communication:  Updated wife   Disposition: -ID recommending to keep in hospital for 2 weeks of IV therapy from date of first negative culture which was 01/01/2021 followed by long acting therapy.  Status is: Inpatient  Remains inpatient appropriate because:ID recommending to keep in hospital for 2  weeks of IV therapy followed by long acting therapy.   Dispo: The patient is from: Home              Anticipated d/c is to: Home              Patient currently is not medically stable to d/c.   Difficult to place patient No  Consultants:   ID for sepsis and HCV, appreciate recs  Cardiology, appreciate recs  Pharmacy, following Vanc dosage and titers  Procedures:   TEE on 5/13, no abnormalities or  vegetations noted  Antimicrobials:  Vancomycin  5/10>>  Subjective: -No further fevers, no chills, eating and drinking okay, had BM  Objective: Vitals:   01/07/21 0434 01/07/21 1235 01/07/21 2052 01/08/21 0402  BP: 110/77 113/78 121/74 109/80  Pulse: 94 (!) 102 100 87  Resp: 19 18 18 18   Temp: 97.7 F (36.5 C) 98 F (36.7 C) 99.5 F (37.5 C) 98.5 F (36.9 C)  TempSrc:  Oral Oral Oral  SpO2: 96% 97% 100% 98%  Weight:      Height:        Intake/Output Summary (Last 24 hours) at 01/08/2021 1204 Last data filed at 01/08/2021 0900 Gross per 24 hour  Intake 1899.51 ml  Output --  Net 1899.51 ml   Filed Weights   12/28/20 1702  Weight: 81.6 kg    Examination:  General exam: Awake, alert, no acute distress Respiratory system: Diminished breath sounds, right more than left, no wheezing  cardiovascular system: normal S1 & S2 heard. No JVD, rubs, gallops or clicks.   Gastrointestinal system: Abdomen is nondistended and soft. . Normal bowel sounds heard. Central nervous system: Alert and oriented. No focal neurological deficits. Extremities: Pt spontaneously moving all extremities with full strength and ROM.  Skin: Rt upper inner thigh/groin area rash - L knuckle index finger with improved erythema, rubor, swelling, and tenderness around index finger knuckle.  Psychiatry: Judgement and insight appear poor. Mood & affect appropriate.   Data Reviewed: I have personally reviewed following labs and imaging studies  CBC: Recent Labs  Lab 01/03/21 0538 01/05/21 0458 01/06/21 0811 01/07/21 0525 01/08/21 0614  WBC 10.7* 12.4* 12.6* 11.1* 9.7  NEUTROABS 6.1 8.0* 8.9* 6.6 5.2  HGB 12.8* 12.0* 13.9 13.3 12.2*  HCT 38.7* 36.7* 42.8 41.0 38.1*  MCV 88.8 88.4 89.9 89.7 90.9  PLT 322 473* 559* 591* 612*    Basic Metabolic Panel: Recent Labs  Lab 01/02/21 0507 01/03/21 0538 01/04/21 0410 01/05/21 0458 01/07/21 0525 01/08/21 0614  NA 135 136  --  134* 131* 133*  K 3.6  3.5  --  3.9 4.0 4.0  CL 102 102  --  100 95* 94*  CO2 24 27  --  28 28 31   GLUCOSE 116* 108*  --  111* 108* 104*  BUN 7 7  --  7 11 10   CREATININE 0.56* 0.47* 0.47* 0.64 0.55* 0.65  CALCIUM 7.2* 7.7*  --  7.8* 8.5* 8.5*  MG 1.9 1.9  --  2.0  --   --     GFR: Estimated Creatinine Clearance: 157.3 mL/min (by C-G formula based on SCr of 0.65 mg/dL).  Liver Function Tests: Recent Labs  Lab 01/02/21 0507 01/03/21 0538 01/05/21 0458 01/07/21 0525 01/08/21 0614  AST 51* 58* 61* 58* 82*  ALT 51* 63* 73* 80* 103*  ALKPHOS 103 92 78 77 74  BILITOT 0.8 0.8 0.5 0.7 0.6  PROT 5.7* 6.1* 6.9 8.3* 7.9  ALBUMIN 2.1* 2.1* 2.1* 2.4* 2.4*    CBG: Recent Labs  Lab 01/04/21 0758  GLUCAP 120*    Recent Results (from the past 240 hour(s))  Culture, blood (Routine X 2) w Reflex to ID Panel     Status: Abnormal   Collection Time: 12/29/20  8:35 PM   Specimen: BLOOD RIGHT HAND  Result Value Ref Range Status   Specimen Description   Final    BLOOD RIGHT HAND Performed at [facility], [address] [place], [place] [address]    Special Requests   Final    BOTTLES DRAWN AEROBIC AND ANAEROBIC Blood Culture results may not be optimal due to an inadequate volume of blood received in culture bottles Performed at [facility], [address], [place], [place] [address]    Culture  Setup Time   Final    ANAEROBIC BOTTLE ONLY GRAM POSITIVE COCCI Gram Stain Report Called to,Read Back By and Verified With: RESULTS PREVIOUSLY CALLED RN MARY ANN HAWLERTON @1324  12/30/20 BY JONES,T [facility] Performed at [facility], [address] [place], [place]    Culture (A)  Final    STAPHYLOCOCCUS AUREUS SUSCEPTIBILITIES PERFORMED ON PREVIOUS CULTURE WITHIN THE LAST 5 DAYS. Performed at [facility], [address], [place], [place]    Report Status 01/01/2021 FINAL  Final  Culture, blood (Routine X 2) w Reflex to ID Panel     Status: Abnormal   Collection Time: 12/29/20  8:36 PM    Specimen: BLOOD  Result Value Ref Range Status   Specimen Description   Final    BLOOD RIGHT ANTECUBITAL Performed at [facility], [address] [place], [place]    Special Requests   Final    BOTTLES DRAWN AEROBIC AND ANAEROBIC Blood Culture results may not be optimal due to an inadequate volume of blood received in culture bottles Performed at [facility], [address] [place], [place]    Culture  Setup Time   Final    IN BOTH AEROBIC AND ANAEROBIC BOTTLES GRAM POSITIVE COCCI Gram Stain Report Called to,Read Back By and Verified With: MARY ANN HAWLERTON, RN @1354  12/30/20 BY JONES,T [facility] CRITICAL VALUE NOTED.  VALUE IS CONSISTENT WITH PREVIOUSLY REPORTED AND CALLED VALUE.    Culture (A)  Final    STAPHYLOCOCCUS AUREUS SUSCEPTIBILITIES PERFORMED ON PREVIOUS CULTURE WITHIN THE LAST 5 DAYS. Performed at [facility], [address] [place], [address] [place]    Report Status 01/01/2021 FINAL  Final  Culture, blood (routine x 2)     Status: Abnormal   Collection Time: 12/30/20  4:20 PM   Specimen: BLOOD RIGHT ARM  Result Value Ref Range Status   Specimen Description   Final    BLOOD RIGHT ARM Performed at [facility], [address], [place], [address] [place]    Special Requests   Final    BOTTLES DRAWN AEROBIC AND ANAEROBIC Blood Culture adequate volume Performed at [facility], [address], [place], [address] [place]    Culture  Setup Time   Final    ANAEROBIC BOTTLE ONLY GRAM POSITIVE COCCI Gram Stain Report Called to,Read Back By and Verified With: T HAIRSTON,RN@0307  01/01/21 MKELLY CRITICAL VALUE NOTED.  VALUE IS CONSISTENT WITH PREVIOUSLY REPORTED AND CALLED VALUE. AEROBIC BOTTLE  GRAM POSITIVE COCCI PREVIOUSLY CALLED Performed at [facility], [address], [place], [address] [place]    Culture (A)  Final    STAPHYLOCOCCUS AUREUS SUSCEPTIBILITIES PERFORMED  ON PREVIOUS CULTURE WITHIN THE LAST 5 DAYS. Performed at [facility], [address] [place], [place] [address]    Report Status 01/04/2021 FINAL  Final  Culture, blood (routine x 2)     Status: Abnormal   Collection Time: 12/30/20  4:20 PM   Specimen: BLOOD LEFT ARM  Result Value Ref Range Status   Specimen Description   Final    BLOOD LEFT ARM Performed at [facility], [address] [place], [place] [address]    Special Requests   Final    BOTTLES DRAWN AEROBIC AND ANAEROBIC Blood Culture adequate volume Performed at [facility], [address], [place], [place] [address]    Culture  Setup Time   Final    GRAM POSITIVE COCCI ANAEROBIC BOTTLE Gram Stain Report Called to,Read Back By and Verified With: P BENGTSON 01/01/21 @ 0857 BY Chauncey Mann Performed at [facility], [address], [place], [place] [address]    Culture (A)  Final    STAPHYLOCOCCUS AUREUS SUSCEPTIBILITIES PERFORMED ON PREVIOUS CULTURE WITHIN THE LAST 5 DAYS. Performed at [facility], [address] [place], [place] [address]    Report Status 01/03/2021 FINAL  Final  Culture, blood (Routine X 2) w Reflex to ID Panel     Status: None   Collection Time: 01/01/21  6:41 PM   Specimen: BLOOD LEFT ARM  Result Value Ref Range Status   Specimen Description BLOOD LEFT ARM  Final   Special Requests   Final    BOTTLES DRAWN AEROBIC AND ANAEROBIC Blood Culture results may not be optimal due to an excessive volume of blood received in culture bottles   Culture   Final    NO GROWTH 5 DAYS Performed at [facility], [address] [place], [place] [address]    Report Status 01/06/2021 FINAL  Final  Culture, blood (Routine X 2) w Reflex to ID Panel     Status: None   Collection Time: 01/01/21  6:41 PM   Specimen: BLOOD RIGHT ARM  Result Value Ref Range Status   Specimen Description BLOOD RIGHT ARM  Final   Special Requests   Final    BOTTLES DRAWN AEROBIC ONLY Blood Culture results may not be optimal due to an inadequate volume of blood received in culture bottles    Culture   Final    NO GROWTH 5 DAYS Performed at [facility], [address], [place], [place] [address]    Report Status 01/06/2021 FINAL  Final  Culture, blood (Routine X 2) w Reflex to ID Panel     Status: None (Preliminary result)   Collection Time: 01/06/21 11:07 AM   Specimen: BLOOD  Result Value Ref Range Status   Specimen Description BLOOD  Final   Special Requests NONE  Final   Culture   Final    NO GROWTH 2 DAYS Performed at [facility], [address], [place], [place] [address]    Report Status PENDING  Incomplete  Culture, blood (Routine X 2) w Reflex to ID Panel     Status: None (Preliminary result)   Collection Time: 01/06/21 11:07 AM   Specimen: BLOOD  Result Value Ref Range Status   Specimen Description BLOOD  Final   Special Requests NONE  Final   Culture   Final    NO GROWTH 2 DAYS Performed at [facility], [address] [place], [place] [address]    Report Status PENDING  Incomplete  Radiology Studies: No results found. Scheduled Meds: . buprenorphine-naloxone  2 tablet Sublingual Q1200  . buprenorphine-naloxone  1 tablet Sublingual BID  . cholecalciferol  2,000 Units Oral Daily  . enoxaparin (LOVENOX) injection  40 mg Subcutaneous Q24H  . nicotine  21 mg Transdermal Daily  . nystatin ointment   Topical BID   And  . triamcinolone ointment   Topical BID  . pantoprazole  40 mg Oral QPM  . polyethylene glycol  17 g Oral Daily  . senna-docusate  2 tablet Oral QHS   Continuous Infusions: . sodium chloride 1,000 mL (01/07/21 1508)  . vancomycin 1,000 mg (01/08/21 0641)    LOS: 11 days   Shon Hale, MD How to contact the [facility] Attending or Consulting provider 7A - 7P or covering provider during after hours 7P -7A, for this patient?  1. Check the care team in [facility] and look for a) attending/consulting TRH provider listed and b) the [facility] team listed 2. Log into [email or web address] and use [facility] universal password to access. If you do not have the  password, please contact the hospital operator. 3. Locate the [facility] provider you are looking for under [facility] and page to a number that you can be directly reached. 4. If you still have difficulty reaching the provider, please page the [facility] (Director on Call) for the Hospitalists listed on amion for assistance.  01/08/2021, 12:04 PM   _____________________________________________

## 2021-01-08 NOTE — TOC Progression Note (Signed)
Transition of Care [facility]) - Progression Note    Patient Details  Name: FELTON BUCZYNSKI MRN: [id number] Date of Birth: 1990-12-19  [facility] CM/SW Contact  Elliot Gault, LCSW Phone Number: 01/08/2021, 2:39 PM  Clinical Narrative:     TOC following. Contacted Kerri at [facility] to refer pt with the request for him to finish his IV anbx treatment there. Referral sent through the hub. Awaiting DON review and determination. Pt aware this might be an option. Will follow.    Barriers to Discharge: Active Substance Use with PICC Line  Expected Discharge Plan and Services   In-house Referral: Clinical Social Work                                             Social Determinants of Health (SDOH) Interventions    Readmission Risk Interventions No flowsheet data found.

## 2021-01-09 ENCOUNTER — Inpatient Hospital Stay: Payer: Self-pay

## 2021-01-09 DIAGNOSIS — I76 Septic arterial embolism: Secondary | ICD-10-CM | POA: Diagnosis not present

## 2021-01-09 DIAGNOSIS — E871 Hypo-osmolality and hyponatremia: Secondary | ICD-10-CM | POA: Diagnosis not present

## 2021-01-09 DIAGNOSIS — J189 Pneumonia, unspecified organism: Secondary | ICD-10-CM | POA: Diagnosis not present

## 2021-01-09 DIAGNOSIS — R7881 Bacteremia: Secondary | ICD-10-CM | POA: Diagnosis not present

## 2021-01-09 LAB — COMPREHENSIVE METABOLIC PANEL
ALT: 112 U/L — ABNORMAL HIGH (ref 0–44)
AST: 79 U/L — ABNORMAL HIGH (ref 15–41)
Albumin: 2.4 g/dL — ABNORMAL LOW (ref 3.5–5.0)
Alkaline Phosphatase: 77 U/L (ref 38–126)
Anion gap: 8 (ref 5–15)
BUN: 10 mg/dL (ref 6–20)
CO2: 30 mmol/L (ref 22–32)
Calcium: 8.5 mg/dL — ABNORMAL LOW (ref 8.9–10.3)
Chloride: 96 mmol/L — ABNORMAL LOW (ref 98–111)
Creatinine, Ser: 0.63 mg/dL (ref 0.61–1.24)
GFR, Estimated: >60 mL/min (ref >60)
Glucose, Bld: 105 mg/dL — ABNORMAL HIGH (ref 70–99)
Potassium: 4 mmol/L (ref 3.5–5.1)
Sodium: 134 mmol/L — ABNORMAL LOW (ref 135–145)
Total Bilirubin: 0.4 mg/dL (ref 0.3–1.2)
Total Protein: 7.6 g/dL (ref 6.5–8.1)

## 2021-01-09 LAB — CBC WITH DIFFERENTIAL/PLATELET
Abs Immature Granulocytes: 0.06 10*3/uL (ref 0.00–0.07)
Basophils Absolute: 0.1 10*3/uL (ref 0.0–0.1)
Basophils Relative: 1 %
Eosinophils Absolute: 0.3 10*3/uL (ref 0.0–0.5)
Eosinophils Relative: 4 %
HCT: 37.8 % — ABNORMAL LOW (ref 39.0–52.0)
Hemoglobin: 12.1 g/dL — ABNORMAL LOW (ref 13.0–17.0)
Immature Granulocytes: 1 %
Lymphocytes Relative: 35 %
Lymphs Abs: 3.2 10*3/uL (ref 0.7–4.0)
MCH: 29.1 pg (ref 26.0–34.0)
MCHC: 32 g/dL (ref 30.0–36.0)
MCV: 90.9 fL (ref 80.0–100.0)
Monocytes Absolute: 0.9 10*3/uL (ref 0.1–1.0)
Monocytes Relative: 10 %
Neutro Abs: 4.5 10*3/uL (ref 1.7–7.7)
Neutrophils Relative %: 49 %
Platelets: 631 10*3/uL — ABNORMAL HIGH (ref 150–400)
RBC: 4.16 MIL/uL — ABNORMAL LOW (ref 4.22–5.81)
RDW: 13.4 % (ref 11.5–15.5)
WBC: 9.2 10*3/uL (ref 4.0–10.5)
nRBC: 0 % (ref 0.0–0.2)

## 2021-01-09 NOTE — Progress Notes (Signed)
PROGRESS NOTE   George Lucas  NLG:[id number] DOB: 1990-12-15 DOA: 12/28/2020 PCP: System, Provider Not In   Chief Complaint  Patient presents with  . Chest Pain   Level of care: Med-Surg  Brief Admission History:  30 y.o. male with medical history significant of nicotine dependence, IVDU presented to ED with a complaint of chest pain.  Currently patient, he woke up at 10 o'clock in the morning with the chest pain.  He then had fever of 104 at around 2:30 AM.  He also started having some dry cough.  He arrived to the ED at around 2 PM as he did not have a ride.  No other complaint.  He claims that he has been clean and has not used IVDU in about 2 months but continues to smoke.   ED Course: Upon arrival to ED, he was tachycardic and tachypneic and afebrile.  CBC showed white cells of 17.  BMP showed hyponatremia and hyperglycemia.  Chest x-ray unremarkable however CT angiogram of the chest showed multifocal pneumonia and septic emboli.  Patient received vancomycin in the ED as well as 1 L of IV fluid bolus.  Hospitalist service were consulted to admit the patient for further management for possible bacterial endocarditis.  Assessment & Plan:   Active Problems:   Multifocal pneumonia   Severe sepsis (HCC)   Septic embolism (HCC)   Acute hyponatremia   Bacteremia   Infection of skin  1)Severe sepsis from MRSA bacteremia, and multifocal pneumonia with Bil Pleural Effusions (Rt> Lt), and   cellulitis of L knuckle -- - Continue IV vancomcycin per ID recommendation, TEE did not show vegetations.  --Pt  Spiked fever up  to 101.2 on 01/06/21, -Otherwise has been afebrile -Blood cultures from 12/28/2020, 12/29/2020 and 12/30/2020 all grew MRSA -Repeat blood cultures from 01/01/2021 and 01/06/2021 NGTD -ID recommends inpatient IV vancomycin administration for 2 weeks from  date of first negative culture (01/01/21) -Leukocytosis has resolved as of 01/08/2021  2)Skin infection of L 2nd digit knuckle -  Pt reports it had been hurting slightly a day before admission - currently erythematous, swollen, and flat with rubor, diffuse borders, significant warmth and swelling circumferentially around entire knuckle most c/w cellulitis, - Conference with ID--continue IV Vanco as above #1  3)Hyponatremia/Hypochloremia- improving  4)Hypokalemia - resolved    5)History of IV drug abuse -  ID recommending to keep in hospital for 2 weeks of IV therapy followed by long acting therapy--please see 1 above  6)Mild R chest pain during inspiration - resolved 01/06/21, repeat CXR 01/06/21 w/ bil pleural effusions Rt > Lt  7)HCV - HCV quant elevated to 95k and reflex genotype revealed HCV type 1a. ID following,  -outpatient ID follow up at [facility] advised  8)Opioid Dependence - Pt was having withdrawal symptoms.  Discussed with pharm D.  Increased suboxone to TID with noontime dose added on 5/15.    9)Opioid induced constipation C/n  laxatives.  10)Acute Anemia -in the setting of acute infection and IV hydration -Hemoglobin currently stable around 12 -No evidence of ongoing bleeding  DVT prophylaxis: enoxaparin  Code Status: full  Family Communication:  Updated wife   Disposition: -ID recommending to keep in hospital for 2 weeks of IV therapy from date of first negative culture which was 01/01/2021 followed by long acting therapy.  Status is: Inpatient  Remains inpatient appropriate because:ID recommending to keep in hospital for 2 weeks of IV therapy followed by long acting therapy.  Dispo: The patient is from: Home              Anticipated d/c is to: Home              Patient currently is not medically stable to d/c.   Difficult to place patient No  Consultants:   ID for sepsis and HCV, appreciate recs  Cardiology, appreciate recs  Pharmacy, following Vanc dosage and titers  Procedures:   TEE on 5/13, no abnormalities or vegetations noted  Antimicrobials:  Vancomycin   5/10>>  Subjective: -Refusing DVT prophylaxis -Difficulties with IV access - may need PICC line while in hospital  Objective: Vitals:   01/08/21 0402 01/08/21 1500 01/08/21 2105 01/09/21 0539  BP: 109/80 110/73 109/70 112/78  Pulse: 87 96 89 87  Resp: 18 17 17 17   Temp: 98.5 F (36.9 C) 98.6 F (37 C) 99.7 F (37.6 C) 99.2 F (37.3 C)  TempSrc: Oral Oral Oral Oral  SpO2: 98% 97% 100% 93%  Weight:      Height:        Intake/Output Summary (Last 24 hours) at 01/09/2021 1035 Last data filed at 01/09/2021 0544 Gross per 24 hour  Intake 720 ml  Output 800 ml  Net -80 ml   Filed Weights   12/28/20 1702  Weight: 81.6 kg    Examination:  General exam: Awake, alert, no acute distress Respiratory system: Diminished breath sounds, right more than left, no wheezing  cardiovascular system: normal S1 & S2 heard. No JVD, rubs, gallops or clicks.   Gastrointestinal system: Abdomen is nondistended and soft. . Normal bowel sounds heard. Central nervous system: Alert and oriented. No focal neurological deficits. Extremities: Pt spontaneously moving all extremities with full strength and ROM, good pedal pulses Skin: Rt upper inner thigh/groin area rash - L knuckle index finger with improved erythema, rubor, swelling, and tenderness around index finger knuckle.  Psychiatry: Judgement and insight appear appropriate, mood & affect appropriate.   Data Reviewed: I have personally reviewed following labs and imaging studies  CBC: Recent Labs  Lab 01/05/21 0458 01/06/21 0811 01/07/21 0525 01/08/21 0614 01/09/21 0403  WBC 12.4* 12.6* 11.1* 9.7 9.2  NEUTROABS 8.0* 8.9* 6.6 5.2 4.5  HGB 12.0* 13.9 13.3 12.2* 12.1*  HCT 36.7* 42.8 41.0 38.1* 37.8*  MCV 88.4 89.9 89.7 90.9 90.9  PLT 473* 559* 591* 612* 631*    Basic Metabolic Panel: Recent Labs  Lab 01/03/21 0538 01/04/21 0410 01/05/21 0458 01/07/21 0525 01/08/21 0614 01/09/21 0403  NA 136  --  134* 131* 133* 134*  K 3.5  --   3.9 4.0 4.0 4.0  CL 102  --  100 95* 94* 96*  CO2 27  --  28 28 31 30   GLUCOSE 108*  --  111* 108* 104* 105*  BUN 7  --  7 11 10 10   CREATININE 0.47* 0.47* 0.64 0.55* 0.65 0.63  CALCIUM 7.7*  --  7.8* 8.5* 8.5* 8.5*  MG 1.9  --  2.0  --   --   --     GFR: Estimated Creatinine Clearance: 157.3 mL/min (by C-G formula based on SCr of 0.63 mg/dL).  Liver Function Tests: Recent Labs  Lab 01/03/21 0538 01/05/21 0458 01/07/21 0525 01/08/21 0614 01/09/21 0403  AST 58* 61* 58* 82* 79*  ALT 63* 73* 80* 103* 112*  ALKPHOS 92 78 77 74 77  BILITOT 0.8 0.5 0.7 0.6 0.4  PROT 6.1* 6.9 8.3* 7.9 7.6  ALBUMIN 2.1* 2.1*  2.4* 2.4* 2.4*    CBG: Recent Labs  Lab 01/04/21 0758  GLUCAP 120*    Recent Results (from the past 240 hour(s))  Culture, blood (routine x 2)     Status: Abnormal   Collection Time: 12/30/20  4:20 PM   Specimen: BLOOD RIGHT ARM  Result Value Ref Range Status   Specimen Description   Final    BLOOD RIGHT ARM Performed at [facility], [address], [place], [place] [address]    Special Requests   Final    BOTTLES DRAWN AEROBIC AND ANAEROBIC Blood Culture adequate volume Performed at [facility], [address], [place], [place] [address]    Culture  Setup Time   Final    ANAEROBIC BOTTLE ONLY GRAM POSITIVE COCCI Gram Stain Report Called to,Read Back By and Verified With: T HAIRSTON,RN@[id number]  01/01/21 MKELLY CRITICAL VALUE NOTED.  VALUE IS CONSISTENT WITH PREVIOUSLY REPORTED AND CALLED VALUE. AEROBIC BOTTLE  GRAM POSITIVE COCCI PREVIOUSLY CALLED Performed at [facility], [address], [place], [place] [address]    Culture (A)  Final    STAPHYLOCOCCUS AUREUS SUSCEPTIBILITIES PERFORMED ON PREVIOUS CULTURE WITHIN THE LAST 5 DAYS. Performed at [facility], [address], [place], [place] [address]    Report Status 01/04/2021 FINAL  Final  Culture, blood (routine x 2)     Status: Abnormal   Collection Time: 12/30/20  4:20 PM   Specimen: BLOOD LEFT ARM   Result Value Ref Range Status   Specimen Description   Final    BLOOD LEFT ARM Performed at [facility], [address], [place], [place] [address]    Special Requests   Final    BOTTLES DRAWN AEROBIC AND ANAEROBIC Blood Culture adequate volume Performed at [facility], [address] [place], [place] [address]    Culture  Setup Time   Final    GRAM POSITIVE COCCI ANAEROBIC BOTTLE Gram Stain Report Called to,Read Back By and Verified With: P BENGTSON 01/01/21 @ 0857 BY Chauncey Mann Performed at [facility], [address], [place], [place] [address]    Culture (A)  Final    STAPHYLOCOCCUS AUREUS SUSCEPTIBILITIES PERFORMED ON PREVIOUS CULTURE WITHIN THE LAST 5 DAYS. Performed at [facility], [address] [place], [place] [address]    Report Status 01/03/2021 FINAL  Final  Culture, blood (Routine X 2) w Reflex to ID Panel     Status: None   Collection Time: 01/01/21  6:41 PM   Specimen: BLOOD LEFT ARM  Result Value Ref Range Status   Specimen Description BLOOD LEFT ARM  Final   Special Requests   Final    BOTTLES DRAWN AEROBIC AND ANAEROBIC Blood Culture results may not be optimal due to an excessive volume of blood received in culture bottles   Culture   Final    NO GROWTH 5 DAYS Performed at [facility], [address] [place], [place] [address]    Report Status 01/06/2021 FINAL  Final  Culture, blood (Routine X 2) w Reflex to ID Panel     Status: None   Collection Time: 01/01/21  6:41 PM   Specimen: BLOOD RIGHT ARM  Result Value Ref Range Status   Specimen Description BLOOD RIGHT ARM  Final   Special Requests   Final    BOTTLES DRAWN AEROBIC ONLY Blood Culture results may not be optimal due to an inadequate volume of blood received in culture bottles   Culture   Final  NO GROWTH 5 DAYS Performed at [facility], [address] [place], [place] [address]    Report Status 01/06/2021 FINAL  Final  Culture, blood (Routine X 2) w Reflex to ID Panel     Status: None  (Preliminary result)   Collection Time: 01/06/21 11:07 AM   Specimen: BLOOD  Result Value Ref Range Status   Specimen Description BLOOD  Final   Special Requests NONE  Final   Culture   Final    NO GROWTH 3 DAYS Performed at [facility], [address] [place], [place] [address]    Report Status PENDING  Incomplete  Culture, blood (Routine X 2) w Reflex to ID Panel     Status: None (Preliminary result)   Collection Time: 01/06/21 11:07 AM   Specimen: BLOOD  Result Value Ref Range Status   Specimen Description BLOOD  Final   Special Requests NONE  Final   Culture   Final    NO GROWTH 3 DAYS Performed at [facility], [address], [place], [place] [address]    Report Status PENDING  Incomplete     Radiology Studies: No results found. Scheduled Meds: . buprenorphine-naloxone  2 tablet Sublingual Q1200  . buprenorphine-naloxone  1 tablet Sublingual BID  . cholecalciferol  2,000 Units Oral Daily  . enoxaparin (LOVENOX) injection  40 mg Subcutaneous Q24H  . nicotine  21 mg Transdermal Daily  . nystatin ointment   Topical BID   And  . triamcinolone ointment   Topical BID  . pantoprazole  40 mg Oral QPM  . polyethylene glycol  17 g Oral Daily  . senna-docusate  2 tablet Oral QHS   Continuous Infusions: . sodium chloride 1,000 mL (01/07/21 1508)  . vancomycin 1,000 mg (01/09/21 0549)    LOS: 12 days   Shon Hale, MD How to contact the [facility] Attending or Consulting provider 7A - 7P or covering provider during after hours 7P -7A, for this patient?  1. Check the care team in [facility] and look for a) attending/consulting TRH provider listed and b) the [facility] team listed 2. Log into [email or web address] and use [place]'s universal password to access. If you do not have the password, please contact the hospital operator. 3. Locate the [facility] provider you are looking for under [facility] and page to a number that you can be directly reached. 4. If you still have difficulty reaching  the provider, please page the [facility] (Director on Call) for the Hospitalists listed on amion for assistance.  01/09/2021, 10:35 AM   _____________________________________________

## 2021-01-09 NOTE — TOC Progression Note (Signed)
Transition of Care [facility]) - Progression Note    Patient Details  Name: George Lucas MRN: [id number] Date of Birth: [birth year]  Transition of Care [employer] [facility]) CM/SW Contact  Elliot Gault, LCSW Phone Number: 01/09/2021, 3:38 PM  Clinical Narrative:     [facility] declined admission to patient. Per RN and MD, pt having issues with peripheral IVs needing to be restarted frequently. MD considering tamper resistant PICC placement with continued plan for at least two total weeks of IV anbx in the hospital.   [facility] will follow and assist as needed.    Barriers to Discharge: Active Substance Use with PICC Line  Expected Discharge Plan and Services   In-house Referral: Clinical Social Work                                             Social Determinants of Health (SDOH) Interventions    Readmission Risk Interventions No flowsheet data found.

## 2021-01-10 DIAGNOSIS — B1920 Unspecified viral hepatitis C without hepatic coma: Secondary | ICD-10-CM

## 2021-01-10 DIAGNOSIS — R7881 Bacteremia: Secondary | ICD-10-CM | POA: Diagnosis not present

## 2021-01-10 DIAGNOSIS — B9562 Methicillin resistant Staphylococcus aureus infection as the cause of diseases classified elsewhere: Secondary | ICD-10-CM

## 2021-01-10 DIAGNOSIS — F1120 Opioid dependence, uncomplicated: Secondary | ICD-10-CM

## 2021-01-10 HISTORY — DX: Unspecified viral hepatitis C without hepatic coma: B19.20

## 2021-01-10 HISTORY — DX: Opioid dependence, uncomplicated: F11.20

## 2021-01-10 HISTORY — DX: Bacteremia: R78.81

## 2021-01-10 LAB — CBC WITH DIFFERENTIAL/PLATELET
Abs Immature Granulocytes: 0.04 10*3/uL (ref 0.00–0.07)
Basophils Absolute: 0.1 10*3/uL (ref 0.0–0.1)
Basophils Relative: 1 %
Eosinophils Absolute: 0.4 10*3/uL (ref 0.0–0.5)
Eosinophils Relative: 5 %
HCT: 41.6 % (ref 39.0–52.0)
Hemoglobin: 13.2 g/dL (ref 13.0–17.0)
Immature Granulocytes: 1 %
Lymphocytes Relative: 41 %
Lymphs Abs: 3.2 10*3/uL (ref 0.7–4.0)
MCH: 29.5 pg (ref 26.0–34.0)
MCHC: 31.7 g/dL (ref 30.0–36.0)
MCV: 93.1 fL (ref 80.0–100.0)
Monocytes Absolute: 0.8 10*3/uL (ref 0.1–1.0)
Monocytes Relative: 10 %
Neutro Abs: 3.2 10*3/uL (ref 1.7–7.7)
Neutrophils Relative %: 42 %
Platelets: 620 10*3/uL — ABNORMAL HIGH (ref 150–400)
RBC: 4.47 MIL/uL (ref 4.22–5.81)
RDW: 13.5 % (ref 11.5–15.5)
WBC: 7.7 10*3/uL (ref 4.0–10.5)
nRBC: 0 % (ref 0.0–0.2)

## 2021-01-10 LAB — COMPREHENSIVE METABOLIC PANEL
ALT: 98 U/L — ABNORMAL HIGH (ref 0–44)
AST: 61 U/L — ABNORMAL HIGH (ref 15–41)
Albumin: 2.5 g/dL — ABNORMAL LOW (ref 3.5–5.0)
Alkaline Phosphatase: 76 U/L (ref 38–126)
Anion gap: 7 (ref 5–15)
BUN: 10 mg/dL (ref 6–20)
CO2: 30 mmol/L (ref 22–32)
Calcium: 8.6 mg/dL — ABNORMAL LOW (ref 8.9–10.3)
Chloride: 97 mmol/L — ABNORMAL LOW (ref 98–111)
Creatinine, Ser: 0.62 mg/dL (ref 0.61–1.24)
GFR, Estimated: >60 mL/min (ref >60)
Glucose, Bld: 103 mg/dL — ABNORMAL HIGH (ref 70–99)
Potassium: 4.7 mmol/L (ref 3.5–5.1)
Sodium: 134 mmol/L — ABNORMAL LOW (ref 135–145)
Total Bilirubin: 0.8 mg/dL (ref 0.3–1.2)
Total Protein: 7.8 g/dL (ref 6.5–8.1)

## 2021-01-10 NOTE — Progress Notes (Signed)
PROGRESS NOTE   George Lucas  FOY:[id number] DOB: September 05, 1990 DOA: 12/28/2020 PCP: System, Provider Not In   Chief Complaint  Patient presents with  . Chest Pain   Level of care: Med-Surg  Brief Admission History:  30 y.o. male with medical history significant of nicotine dependence, IVDU presented to ED with a complaint of chest pain.  Currently patient, he woke up at 10 o'clock in the morning with the chest pain.  He then had fever of 104 at around 2:30 AM.  He also started having some dry cough.  He arrived to the ED at around 2 PM as he did not have a ride.  No other complaint.  He claims that he has been clean and has not used IVDU in about 2 months but continues to smoke.   ED Course: Upon arrival to ED, he was tachycardic and tachypneic and afebrile.  CBC showed white cells of 17.  BMP showed hyponatremia and hyperglycemia.  Chest x-ray unremarkable however CT angiogram of the chest showed multifocal pneumonia and septic emboli.  Patient received vancomycin in the ED as well as 1 L of IV fluid bolus.  Hospitalist service were consulted to admit the patient for further management for possible bacterial endocarditis.  Assessment & Plan:   Principal Problem:   MRSA bacteremia----IVDU Active Problems:   MRSA Severe Sepsis (HCC)   MRSA Septic Embolism /IVDU   MRSA Bacteremia and Sepsis and IVDU   Heroin addiction ---IVDU   MRSA Multifocal pneumonia   Acute hyponatremia   Opiate Dependence/IVDU    Hepatitis-C----IVDU   Infection of skin   1)Severe Sepsis from MRSA Bacteremia, and Multifocal Pneumonia with Bil Pleural Effusions (Rt> Lt) and Cellulitis of L knuckle -- - Continue IV vancomcycin per ID recommendation, TEE did not show vegetations.  --Pt  Spiked fever up  to 101.2 on 01/06/21, -Otherwise has been afebrile -Blood cultures from 12/28/2020, 12/29/2020 and 12/30/2020 all grew MRSA -Repeat blood cultures from 01/01/2021 and 01/06/2021 NGTD -ID recommending to keep in hospital  for 2 weeks of IV therapy from date of first negative culture which was 01/01/2021 ,anticipate that last day of iv Vancomycin in the hospital will be around 01/15/2021 --followed by long acting therap -Leukocytosis has resolved as of 01/08/2021  2)Skin infection of L 2nd digit knuckle - Pt reports it had been hurting slightly a day before admission - currently erythematous, swollen, and flat with rubor, diffuse borders, significant warmth and swelling circumferentially around entire knuckle most c/w cellulitis, - Conference with ID--continue IV Vanco as above #1  3)Hyponatremia/Hypochloremia- improving  4)Hypokalemia - resolved    5)History of IV drug abuse -  ID recommending to keep in hospital for 2 weeks of IV therapy followed by long acting therapy--please see 1 above  6)Mild R Chest pain during inspiration - resolved 01/06/21, repeat CXR 01/06/21 w/ bil pleural effusions Rt > Lt  7)HCV - HCV quant elevated to 95k and reflex genotype revealed HCV type 1a. ID following -Outpatient ID follow up at the [facility] advised  8)Opioid Dependence - Pt was having withdrawal symptoms.  Discussed with pharm D.  Increased suboxone to TID with noontime dose added on 5/15.    9)Opioid induced constipation--- C/n  laxatives.  10)Acute Anemia -in the setting of acute infection and IV hydration -Hemoglobin currently stable around 12 -No evidence of ongoing bleeding  DVT prophylaxis: enoxaparin  Code Status: full  Family Communication:  Updated wife   Disposition: -ID recommending to keep in  hospital for 2 weeks of IV therapy from date of first negative culture which was 01/01/2021 ,anticipate that last day of iv Vancomycin in the hospital will be around 01/15/2021 --followed by long acting therapy Status is: Inpatient  Remains inpatient appropriate because:Please see disposition above  Dispo: The patient is from: Home              Anticipated d/c is to: Home around 01/15/21              Patient  currently is not medically stable to d/c.   Difficult to place patient No  Consultants:   ID for sepsis and HCV, appreciate recs  Cardiology, appreciate recs  Pharmacy, following Vanc dosage and titers  Procedures:   TEE on 5/13, no abnormalities or vegetations noted  Antimicrobials:  Vancomycin  5/10>>  Subjective: - Had ultrasound placed peripheral IVs -Overall cooperative, eating and drinking well no diarrhea  Objective: Vitals:   01/09/21 1509 01/09/21 2138 01/10/21 0435 01/10/21 1301  BP: 114/70 110/72 108/69 101/73  Pulse: 86 87 83 80  Resp: 20 18 18  (!) 21  Temp: 98.6 F (37 C) 99.4 F (37.4 C) 98.5 F (36.9 C) 98.1 F (36.7 C)  TempSrc: Oral Oral Oral Oral  SpO2: 100% 98% 96% 97%  Weight:      Height:        Intake/Output Summary (Last 24 hours) at 01/10/2021 1556 Last data filed at 01/09/2021 1819 Gross per 24 hour  Intake 360 ml  Output --  Net 360 ml   Filed Weights   12/28/20 1702  Weight: 81.6 kg    Examination:  General exam: Awake, alert, no acute distress Respiratory system: Diminished breath sounds, right more than left, no wheezing  cardiovascular system: normal S1 & S2 heard. No JVD, rubs, gallops or clicks.   Gastrointestinal system: Abdomen is nondistended and soft. . Normal bowel sounds heard. Central nervous system: Alert and oriented. No focal neurological deficits. Extremities: Pt spontaneously moving all extremities with full strength and ROM, good pedal pulses Skin: Rt upper inner thigh/groin area rash - L knuckle index finger with mostly residual erythema, swelling, and tenderness around index finger knuckle.  Psychiatry: Judgement and insight appear appropriate, mood & affect appropriate.   Data Reviewed: I have personally reviewed following labs and imaging studies  CBC: Recent Labs  Lab 01/06/21 0811 01/07/21 0525 01/08/21 0614 01/09/21 0403 01/10/21 0449  WBC 12.6* 11.1* 9.7 9.2 7.7  NEUTROABS 8.9* 6.6 5.2 4.5  3.2  HGB 13.9 13.3 12.2* 12.1* 13.2  HCT 42.8 41.0 38.1* 37.8* 41.6  MCV 89.9 89.7 90.9 90.9 93.1  PLT 559* 591* 612* 631* 620*    Basic Metabolic Panel: Recent Labs  Lab 01/05/21 0458 01/07/21 0525 01/08/21 0614 01/09/21 0403 01/10/21 0449  NA 134* 131* 133* 134* 134*  K 3.9 4.0 4.0 4.0 4.7  CL 100 95* 94* 96* 97*  CO2 28 28 31 30 30   GLUCOSE 111* 108* 104* 105* 103*  BUN 7 11 10 10 10   CREATININE 0.64 0.55* 0.65 0.63 0.62  CALCIUM 7.8* 8.5* 8.5* 8.5* 8.6*  MG 2.0  --   --   --   --     GFR: Estimated Creatinine Clearance: 157.3 mL/min (by C-G formula based on SCr of 0.62 mg/dL).  Liver Function Tests: Recent Labs  Lab 01/05/21 0458 01/07/21 0525 01/08/21 0614 01/09/21 0403 01/10/21 0449  AST 61* 58* 82* 79* 61*  ALT 73* 80* 103* 112* 98*  ALKPHOS  78 77 74 77 76  BILITOT 0.5 0.7 0.6 0.4 0.8  PROT 6.9 8.3* 7.9 7.6 7.8  ALBUMIN 2.1* 2.4* 2.4* 2.4* 2.5*    CBG: Recent Labs  Lab 01/04/21 0758  GLUCAP 120*    Recent Results (from the past 240 hour(s))  Culture, blood (Routine X 2) w Reflex to ID Panel     Status: None   Collection Time: 01/01/21  6:41 PM   Specimen: BLOOD LEFT ARM  Result Value Ref Range Status   Specimen Description BLOOD LEFT ARM  Final   Special Requests   Final    BOTTLES DRAWN AEROBIC AND ANAEROBIC Blood Culture results may not be optimal due to an excessive volume of blood received in culture bottles   Culture   Final    NO GROWTH 5 DAYS Performed at [facility], [address], [place], [place] [address]    Report Status 01/06/2021 FINAL  Final  Culture, blood (Routine X 2) w Reflex to ID Panel     Status: None   Collection Time: 01/01/21  6:41 PM   Specimen: BLOOD RIGHT ARM  Result Value Ref Range Status   Specimen Description BLOOD RIGHT ARM  Final   Special Requests   Final    BOTTLES DRAWN AEROBIC ONLY Blood Culture results may not be optimal due to an inadequate volume of blood received in culture bottles   Culture    Final    NO GROWTH 5 DAYS Performed at [facility], [address], [place], [place] [address]    Report Status 01/06/2021 FINAL  Final  Culture, blood (Routine X 2) w Reflex to ID Panel     Status: None (Preliminary result)   Collection Time: 01/06/21 11:07 AM   Specimen: BLOOD  Result Value Ref Range Status   Specimen Description BLOOD  Final   Special Requests NONE  Final   Culture   Final    NO GROWTH 4 DAYS Performed at [facility], [address], [place], [place] [address]    Report Status PENDING  Incomplete  Culture, blood (Routine X 2) w Reflex to ID Panel     Status: None (Preliminary result)   Collection Time: 01/06/21 11:07 AM   Specimen: BLOOD  Result Value Ref Range Status   Specimen Description BLOOD  Final   Special Requests NONE  Final   Culture   Final    NO GROWTH 4 DAYS Performed at [facility], [address], [place], [place] [address]    Report Status PENDING  Incomplete     Radiology Studies: [place] EKG SITE RITE  Result Date: 01/09/2021 If Site Rite image not attached, placement could not be confirmed due to current cardiac rhythm.  Scheduled Meds: . buprenorphine-naloxone  2 tablet Sublingual Q1200  . buprenorphine-naloxone  1 tablet Sublingual BID  . cholecalciferol  2,000 Units Oral Daily  . enoxaparin (LOVENOX) injection  40 mg Subcutaneous Q24H  . nicotine  21 mg Transdermal Daily  . nystatin ointment   Topical BID   And  . triamcinolone ointment   Topical BID  . pantoprazole  40 mg Oral QPM  . polyethylene glycol  17 g Oral Daily  . senna-docusate  2 tablet Oral QHS   Continuous Infusions: . sodium chloride 1,000 mL (01/07/21 1508)  . vancomycin 1,000 mg (01/10/21 1306)    LOS: 13 days   Shon Hale, MD How to contact the [facility] Attending or Consulting provider 7A - 7P or covering provider during  after hours 7P -7A, for this patient?  1. Check the care team in [facility] and look for a) attending/consulting TRH provider listed and b) the  [facility] team listed 2. Log into [email or web address] and use [place]'s universal password to access. If you do not have the password, please contact the hospital operator. 3. Locate the [facility] provider you are looking for under [facility] and page to a number that you can be directly reached. 4. If you still have difficulty reaching the provider, please page the [facility] (Director on Call) for the Hospitalists listed on amion for assistance.  01/10/2021, 3:56 PM   _____________________________________________

## 2021-01-10 NOTE — Progress Notes (Signed)
Pharmacy Antibiotic Note  George Lucas is a 30 y.o. 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  Pharmacy has been consulted for vancomycin dosing.   Plan: Calculated Vanco AUC: 490. Therapeutic range. Continue vancomycin 1000mg  IV every 8 hours F/U cxs and clinical progress Monitor V/S, labs and levels as indicated  Height: 6\' 2"  (188 cm) Weight: 81.6 kg (180 lb) IBW/kg (Calculated) : 82.2  Temp (24hrs), Avg:98.8 F (37.1 C), Min:98.5 F (36.9 C), Max:99.4 F (37.4 C)  Recent Labs  Lab 01/05/21 0458 01/06/21 0811 01/07/21 0056 01/07/21 0525 01/08/21 0614 01/09/21 0403 01/10/21 0449  WBC 12.4* 12.6*  --  11.1* 9.7 9.2 7.7  CREATININE 0.64  --   --  0.55* 0.65 0.63 0.62  VANCOTROUGH  --   --   --  15  --   --   --   VANCOPEAK  --   --  23*  --   --   --   --     Estimated Creatinine Clearance: 157.3 mL/min (by C-G formula based on SCr of 0.62 mg/dL).    Allergies  Allergen Reactions  . Sulfa Antibiotics Anaphylaxis and Rash    Antimicrobials this admission:  Vancomycin 5/8 >>  5/8 cefepime >> 5/9  Microbiology results: 5/17 BCx: ngtd 5/8 BCx: MRSA  5/9 MRSA PCR positive 5/9 BCX: MRSA   Thank you for allowing pharmacy to be a part of this patient's care.  7/9, PharmD Clinical Pharmacist 01/10/2021 11:22 AM

## 2021-01-10 NOTE — Progress Notes (Signed)
Spoke with Elease Hashimoto RN re  PICC and midline order. RN to notify MD that this RN recommends a PICC vs midline d/t 2 weeks of Vanc remaining.  RN to follow up with [facility] RN vs [facility] for PICC placement.

## 2021-01-11 DIAGNOSIS — R7881 Bacteremia: Secondary | ICD-10-CM | POA: Diagnosis not present

## 2021-01-11 DIAGNOSIS — B9562 Methicillin resistant Staphylococcus aureus infection as the cause of diseases classified elsewhere: Secondary | ICD-10-CM | POA: Diagnosis not present

## 2021-01-11 LAB — CULTURE, BLOOD (ROUTINE X 2)
Culture: NO GROWTH
Culture: NO GROWTH
Special Requests: ADEQUATE

## 2021-01-11 LAB — COMPREHENSIVE METABOLIC PANEL
ALT: 80 U/L — ABNORMAL HIGH (ref 0–44)
AST: 40 U/L (ref 15–41)
Albumin: 2.5 g/dL — ABNORMAL LOW (ref 3.5–5.0)
Alkaline Phosphatase: 72 U/L (ref 38–126)
Anion gap: 7 (ref 5–15)
BUN: 8 mg/dL (ref 6–20)
CO2: 30 mmol/L (ref 22–32)
Calcium: 8.5 mg/dL — ABNORMAL LOW (ref 8.9–10.3)
Chloride: 97 mmol/L — ABNORMAL LOW (ref 98–111)
Creatinine, Ser: 0.56 mg/dL — ABNORMAL LOW (ref 0.61–1.24)
GFR, Estimated: >60 mL/min (ref >60)
Glucose, Bld: 107 mg/dL — ABNORMAL HIGH (ref 70–99)
Potassium: 3.7 mmol/L (ref 3.5–5.1)
Sodium: 134 mmol/L — ABNORMAL LOW (ref 135–145)
Total Bilirubin: 0.3 mg/dL (ref 0.3–1.2)
Total Protein: 7.5 g/dL (ref 6.5–8.1)

## 2021-01-11 LAB — CBC WITH DIFFERENTIAL/PLATELET
Abs Immature Granulocytes: 0.03 10*3/uL (ref 0.00–0.07)
Basophils Absolute: 0.1 10*3/uL (ref 0.0–0.1)
Basophils Relative: 2 %
Eosinophils Absolute: 0.3 10*3/uL (ref 0.0–0.5)
Eosinophils Relative: 5 %
HCT: 38.9 % — ABNORMAL LOW (ref 39.0–52.0)
Hemoglobin: 12.5 g/dL — ABNORMAL LOW (ref 13.0–17.0)
Immature Granulocytes: 0 %
Lymphocytes Relative: 36 %
Lymphs Abs: 2.5 10*3/uL (ref 0.7–4.0)
MCH: 29.3 pg (ref 26.0–34.0)
MCHC: 32.1 g/dL (ref 30.0–36.0)
MCV: 91.3 fL (ref 80.0–100.0)
Monocytes Absolute: 0.7 10*3/uL (ref 0.1–1.0)
Monocytes Relative: 10 %
Neutro Abs: 3.3 10*3/uL (ref 1.7–7.7)
Neutrophils Relative %: 47 %
Platelets: 584 10*3/uL — ABNORMAL HIGH (ref 150–400)
RBC: 4.26 MIL/uL (ref 4.22–5.81)
RDW: 13.2 % (ref 11.5–15.5)
WBC: 7 10*3/uL (ref 4.0–10.5)
nRBC: 0 % (ref 0.0–0.2)

## 2021-01-11 NOTE — Progress Notes (Signed)
PROGRESS NOTE   George Lucas  HAL:[id number] DOB: Apr 12, 1991 DOA: 12/28/2020 PCP: System, Provider Not In   Chief Complaint  Patient presents with  . Chest Pain   Level of care: Med-Surg  Brief Admission History:  30 y.o. male with medical history significant of nicotine dependence, IVDU presented to ED with a complaint of chest pain.  Currently patient, he woke up at 10 o'clock in the morning with the chest pain.  He then had fever of 104 at around 2:30 AM.  He also started having some dry cough.  He arrived to the ED at around 2 PM as he did not have a ride.  No other complaint.  He claims that he has been clean and has not used IVDU in about 2 months but continues to smoke.   ED Course: Upon arrival to ED, he was tachycardic and tachypneic and afebrile.  CBC showed white cells of 17.  BMP showed hyponatremia and hyperglycemia.  Chest x-ray unremarkable however CT angiogram of the chest showed multifocal pneumonia and septic emboli.  Patient received vancomycin in the ED as well as 1 L of IV fluid bolus.  Hospitalist service were consulted to admit the patient for further management for possible bacterial endocarditis.  Assessment & Plan:   Principal Problem:   MRSA bacteremia----IVDU Active Problems:   MRSA Severe Sepsis (HCC)   MRSA Septic Embolism /IVDU   MRSA Bacteremia and Sepsis and IVDU   Heroin addiction ---IVDU   MRSA Multifocal pneumonia   Acute hyponatremia   Opiate Dependence/IVDU    Hepatitis-C----IVDU   Infection of skin   1)Severe Sepsis from MRSA Bacteremia, and Multifocal Pneumonia with Bil Pleural Effusions (Rt> Lt) and Cellulitis of L knuckle -- - Continue IV vancomcycin per ID recommendation, TEE did not show vegetations.  --Pt  Spiked fever up  to 101.2 on 01/06/21, -Otherwise has been afebrile -Blood cultures from 12/28/2020, 12/29/2020 and 12/30/2020 all grew MRSA -Repeat blood cultures from 01/01/2021 and 01/06/2021 NGTD -ID recommending to keep in hospital  for 2 weeks of IV therapy from date of first negative culture which was 01/01/2021 ,anticipate that last day of iv Vancomycin in the hospital will be around 01/15/2021 --followed by long acting therap -Leukocytosis has resolved as of 01/08/2021   2)Skin infection of L 2nd digit knuckle - Pt reports it had been hurting slightly a day before admission - currently erythematous, swollen, and flat with rubor, diffuse borders, significant warmth and swelling circumferentially around entire knuckle most c/w cellulitis, - Conference with ID--continue IV Vanco as above #1  3)Hyponatremia/Hypochloremia- improving  4)Hypokalemia - resolved    5)History of IV drug abuse -  ID recommending to keep in hospital for 2 weeks of IV therapy followed by long acting therapy--please see 1 above  6)Mild R Chest pain during inspiration - resolved 01/06/21, repeat CXR 01/06/21 w/ bil pleural effusions Rt > Lt  7)HCV - HCV quant elevated to 95k and reflex genotype revealed HCV type 1a. ID following -Outpatient ID follow up at the [facility] advised  8)Opioid Dependence - Pt was having withdrawal symptoms.  Discussed with pharm D.  Increased suboxone to TID with noontime dose added on 5/15.    9)Opioid induced constipation--- C/n  laxatives.  10)Acute Anemia -in the setting of acute infection and IV hydration -Hemoglobin currently stable around 12 -No evidence of ongoing bleeding  DVT prophylaxis: enoxaparin  Code Status: full  Family Communication:  Updated wife   Disposition: -ID recommending to keep  in hospital for 2 weeks of IV therapy from date of first negative culture which was 01/01/2021 ,anticipate that last day of iv Vancomycin in the hospital will be around 01/15/2021 --followed by long acting therapy Status is: Inpatient  Remains inpatient appropriate because:Please see disposition above  Dispo: The patient is from: Home              Anticipated d/c is to: Home around 01/15/21              Patient  currently is not medically stable to d/c.   Difficult to place patient No  Consultants:   ID for sepsis and HCV, appreciate recs  Cardiology, appreciate recs  Pharmacy, following Vanc dosage and titers  Procedures:   TEE on 5/13, no abnormalities or vegetations noted  Antimicrobials:  Vancomycin  5/10>>  Subjective: On the phone with his wife, no new concerns eating and drinking well  Objective: Vitals:   01/10/21 1301 01/10/21 2144 01/11/21 0548 01/11/21 1311  BP: 101/73 97/64 114/70 107/73  Pulse: 80 92 76 87  Resp: (!) 21 18 18 18   Temp: 98.1 F (36.7 C) 98.5 F (36.9 C) 98.3 F (36.8 C) 98.6 F (37 C)  TempSrc: Oral Oral  Oral  SpO2: 97% 96% 95% 97%  Weight:      Height:        Intake/Output Summary (Last 24 hours) at 01/11/2021 1851 Last data filed at 01/11/2021 1651 Gross per 24 hour  Intake 2570 ml  Output --  Net 2570 ml   Filed Weights   12/28/20 1702  Weight: 81.6 kg    Examination:  General exam: Awake, alert, no acute distress Respiratory system: Diminished breath sounds, right more than left, no wheezing  cardiovascular system: normal S1 & S2 heard. No JVD, rubs, gallops or clicks.   Gastrointestinal system: Abdomen is nondistended and soft. . Normal bowel sounds heard. Central nervous system: Alert and oriented. No focal neurological deficits. Extremities: Pt spontaneously moving all extremities with full strength and ROM, good pedal pulses Skin: Rt upper inner thigh/groin area rash - L knuckle index finger with mostly residual erythema, swelling, and tenderness around index finger knuckle.  Psychiatry: Judgement and insight appear appropriate, mood & affect appropriate.   Data Reviewed: I have personally reviewed following labs and imaging studies  CBC: Recent Labs  Lab 01/07/21 0525 01/08/21 0614 01/09/21 0403 01/10/21 0449 01/11/21 0549  WBC 11.1* 9.7 9.2 7.7 7.0  NEUTROABS 6.6 5.2 4.5 3.2 3.3  HGB 13.3 12.2* 12.1* 13.2 12.5*   HCT 41.0 38.1* 37.8* 41.6 38.9*  MCV 89.7 90.9 90.9 93.1 91.3  PLT 591* 612* 631* 620* 584*    Basic Metabolic Panel: Recent Labs  Lab 01/05/21 0458 01/07/21 0525 01/08/21 0614 01/09/21 0403 01/10/21 0449 01/11/21 0549  NA 134* 131* 133* 134* 134* 134*  K 3.9 4.0 4.0 4.0 4.7 3.7  CL 100 95* 94* 96* 97* 97*  CO2 28 28 31 30 30 30   GLUCOSE 111* 108* 104* 105* 103* 107*  BUN 7 11 10 10 10 8   CREATININE 0.64 0.55* 0.65 0.63 0.62 0.56*  CALCIUM 7.8* 8.5* 8.5* 8.5* 8.6* 8.5*  MG 2.0  --   --   --   --   --     GFR: Estimated Creatinine Clearance: 157.3 mL/min (A) (by C-G formula based on SCr of 0.56 mg/dL (L)).  Liver Function Tests: Recent Labs  Lab 01/07/21 01/08/21 01/09/21 01/09/21 0403 01/10/21 0449 01/11/21 0549  AST 58* 82* 79* 61* 40  ALT 80* 103* 112* 98* 80*  ALKPHOS 77 74 77 76 72  BILITOT 0.7 0.6 0.4 0.8 0.3  PROT 8.3* 7.9 7.6 7.8 7.5  ALBUMIN 2.4* 2.4* 2.4* 2.5* 2.5*    CBG: No results for input(s): GLUCAP in the last 168 hours.  Recent Results (from the past 240 hour(s))  Culture, blood (Routine X 2) w Reflex to ID Panel     Status: None   Collection Time: 01/06/21 11:07 AM   Specimen: BLOOD  Result Value Ref Range Status   Specimen Description BLOOD  Final   Special Requests NONE  Final   Culture   Final    NO GROWTH 5 DAYS Performed at [facility], [address] [place], [place] [address]    Report Status 01/11/2021 FINAL  Final  Culture, blood (Routine X 2) w Reflex to ID Panel     Status: None   Collection Time: 01/06/21 11:07 AM   Specimen: BLOOD  Result Value Ref Range Status   Specimen Description BLOOD BLOOD RIGHT HAND  Final   Special Requests   Final    BOTTLES DRAWN AEROBIC AND ANAEROBIC Blood Culture adequate volume   Culture   Final    NO GROWTH 5 DAYS Performed at [facility], [address], [place], [place] [address]    Report Status 01/11/2021 FINAL  Final     Radiology Studies: No results found. Scheduled  Meds: . buprenorphine-naloxone  2 tablet Sublingual Q1200  . buprenorphine-naloxone  1 tablet Sublingual BID  . cholecalciferol  2,000 Units Oral Daily  . enoxaparin (LOVENOX) injection  40 mg Subcutaneous Q24H  . nicotine  21 mg Transdermal Daily  . nystatin ointment   Topical BID   And  . triamcinolone ointment   Topical BID  . pantoprazole  40 mg Oral QPM  . polyethylene glycol  17 g Oral Daily  . senna-docusate  2 tablet Oral QHS   Continuous Infusions: . sodium chloride 10 mL/hr at 01/11/21 1651  . vancomycin Stopped (01/11/21 1346)    LOS: 14 days   Shon Hale, MD How to contact the [facility] Attending or Consulting provider 7A - 7P or covering provider during after hours 7P -7A, for this patient?  1. Check the care team in [facility] and look for a) attending/consulting TRH provider listed and b) the [facility] team listed 2. Log into [email or web address] and use [facility]'s universal password to access. If you do not have the password, please contact the hospital operator. 3. Locate the [facility] provider you are looking for under [facility] and page to a number that you can be directly reached. 4. If you still have difficulty reaching the provider, please page the [facility] (Director on Call) for the Hospitalists listed on amion for assistance.  01/11/2021, 6:51 PM   _____________________________________________

## 2021-01-12 DIAGNOSIS — R7881 Bacteremia: Secondary | ICD-10-CM | POA: Diagnosis not present

## 2021-01-12 DIAGNOSIS — B9562 Methicillin resistant Staphylococcus aureus infection as the cause of diseases classified elsewhere: Secondary | ICD-10-CM | POA: Diagnosis not present

## 2021-01-12 NOTE — Progress Notes (Addendum)
      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  He had low quality TTE   TEE done  without vegetations--I presume because he embolized all of what he might have had to the lungs  Repeat blood cultures taken 5/10/2022are STILL growing MRSA, 01/01/2021 no growth so far at 2 days   He is done well with IV antibiotics. I would switch to oral doxycycline at DC to complete 6 weeks of total therapy    [name] has an appointment on 01/29/2021 at 9 AM with Dr. Daiva Eves  He should arrive 15 to 30 minutes prior to his appointment.  [facility] is located in the [facility] at  [address] [place] in [place].  [address], which is located to the left of the elevators.  Phone: [phone]  Fax: [phone]  [email or web address]    I am also happy to see him in our clinic for followup  Paulette Blanch Dam 01/12/2021, 4:20 PM

## 2021-01-12 NOTE — Progress Notes (Signed)
PROGRESS NOTE   George Lucas  ZGY:[id number] DOB: June 15, 1991 DOA: 12/28/2020 PCP: System, Provider Not In   Chief Complaint  Patient presents with  . Chest Pain   Level of care: Med-Surg  Brief Admission History:  30 y.o. male with medical history significant of nicotine dependence, IVDU presented to ED with a complaint of chest pain.  Currently patient, he woke up at 10 o'clock in the morning with the chest pain.  He then had fever of 104 at around 2:30 AM.  He also started having some dry cough.  He arrived to the ED at around 2 PM as he did not have a ride.  No other complaint.    ED Course: Upon arrival to ED, he was tachycardic and tachypneic and afebrile.  CBC showed white cells of 17.  BMP showed hyponatremia and hyperglycemia.  Chest x-ray unremarkable however CT angiogram of the chest showed multifocal pneumonia and septic emboli.  Patient received vancomycin in the ED as well as 1 L of IV fluid bolus.  Hospitalist service were consulted to admit the patient for further management for possible bacterial endocarditis.  Assessment & Plan:   Principal Problem:   MRSA bacteremia----IVDU Active Problems:   MRSA Severe Sepsis (HCC)   MRSA Septic Embolism /IVDU   MRSA Bacteremia and Sepsis and IVDU   Heroin addiction ---IVDU   MRSA Multifocal pneumonia   Acute hyponatremia   Opiate Dependence/IVDU    Hepatitis-C----IVDU   Infection of skin   1)Severe Sepsis from MRSA Bacteremia, and Multifocal Pneumonia with Bil Pleural Effusions (Rt> Lt) and Cellulitis of L knuckle -- - Continue IV vancomcycin per ID recommendation, TEE did not show vegetations.  --Pt  Spiked fever up  to 101.2 on 01/06/21, -Otherwise has been afebrile -Blood cultures from 12/28/2020, 12/29/2020 and 12/30/2020 all grew MRSA -Repeat blood cultures from 01/01/2021 and 01/06/2021 NGTD -ID recommending to keep in hospital for 2 weeks of IV therapy from date of first negative culture which was 01/01/2021 ,anticipate that  last day of iv Vancomycin in the hospital will be around 01/15/2021 -- -Discussed with Dr Daiva Eves (ID Physician) on 01/12/21 he advised dc home on Thursday, 01/15/2021 --on p.o. doxycycline 100 mg twice daily for 4 weeks .  Patient will need ID follow-up as outpatient in about 10 days post discharge  -Leukocytosis has resolved as of 01/08/2021   2)Skin infection of L 2nd digit knuckle - Pt reports it had been hurting slightly a day before admission - currently erythematous, swollen, and flat with rubor, diffuse borders, significant warmth and swelling circumferentially around entire knuckle most c/w cellulitis, - Conference with ID--continue IV Vanco as above #1  3)Hyponatremia/Hypochloremia- improving  4)Hypokalemia - resolved    5)History of IV drug abuse -  ID recommending to keep in hospital for 2 weeks of IV therapy followed by long acting therapy--please see 1 above  6)Mild R Chest pain during inspiration - resolved 01/06/21, repeat CXR 01/06/21 w/ bil pleural effusions Rt > Lt  7)HCV - HCV quant elevated to 95k and reflex genotype revealed HCV type 1a. ID following -Outpatient ID follow up at the [facility] clinic advised  8)Opioid Dependence - Pt was having withdrawal symptoms.  Discussed with pharm D.  Increased suboxone to TID with noontime dose added on 5/15.    9)Opioid induced constipation--- C/n  laxatives.  10)Acute Anemia -in the setting of acute infection and IV hydration -Hemoglobin currently stable around 12 -No evidence of ongoing bleeding  DVT prophylaxis:  enoxaparin  Code Status: full  Family Communication:  Updated wife   Disposition: --Discussed with Dr Daiva Eves (ID Physician) on 01/12/21 he advised dc home on Thursday, 01/15/2021 --on p.o. doxycycline 100 mg twice daily for 4 weeks .  Patient will need ID follow-up as outpatient in about 10 days post discharge  Status is: Inpatient  Remains inpatient appropriate because:Please see disposition above  Dispo: The patient  is from: Home              Anticipated d/c is to: Home around 01/15/21 on po doxycycline x 1 month              Patient currently is not medically stable to d/c.   Difficult to place patient No  Consultants:   ID for sepsis and HCV, appreciate recs  Cardiology, appreciate recs  Pharmacy, following Vanc dosage and titers  Procedures:   TEE on 5/13, no abnormalities or vegetations noted  Antimicrobials:  Vancomycin  5/10>>  Subjective: -No further fevers, no chills overall cooperative -  Objective: Vitals:   01/11/21 0548 01/11/21 1311 01/11/21 2120 01/12/21 0605  BP: 114/70 107/73 110/76 106/70  Pulse: 76 87 86 85  Resp: 18 18 17 17   Temp: 98.3 F (36.8 C) 98.6 F (37 C) 98.6 F (37 C) 98.5 F (36.9 C)  TempSrc:  Oral Oral   SpO2: 95% 97% 98% 97%  Weight:      Height:        Intake/Output Summary (Last 24 hours) at 01/12/2021 1203 Last data filed at 01/12/2021 1100 Gross per 24 hour  Intake 919.91 ml  Output 1150 ml  Net -230.09 ml   Filed Weights   12/28/20 1702  Weight: 81.6 kg    Examination:  General exam: Awake, alert, no acute distress Respiratory system: Diminished breath sounds, right more than left, no wheezing  cardiovascular system: normal S1 & S2 heard. No JVD,  Gastrointestinal system: Abdomen is nondistended and soft. . Normal bowel sounds heard. Central nervous system: Alert and oriented. No focal neurological deficits. Extremities: Pt spontaneously moving all extremities with full strength and ROM, good pedal pulses Skin: Warm and dry Psychiatry: Judgement and insight appear appropriate, mood & affect appropriate.   Data Reviewed: I have personally reviewed following labs and imaging studies  CBC: Recent Labs  Lab 01/07/21 0525 01/08/21 0614 01/09/21 0403 01/10/21 0449 01/11/21 0549  WBC 11.1* 9.7 9.2 7.7 7.0  NEUTROABS 6.6 5.2 4.5 3.2 3.3  HGB 13.3 12.2* 12.1* 13.2 12.5*  HCT 41.0 38.1* 37.8* 41.6 38.9*  MCV 89.7 90.9 90.9  93.1 91.3  PLT 591* 612* 631* 620* 584*    Basic Metabolic Panel: Recent Labs  Lab 01/07/21 0525 01/08/21 0614 01/09/21 0403 01/10/21 0449 01/11/21 0549  NA 131* 133* 134* 134* 134*  K 4.0 4.0 4.0 4.7 3.7  CL 95* 94* 96* 97* 97*  CO2 28 31 30 30 30   GLUCOSE 108* 104* 105* 103* 107*  BUN 11 10 10 10 8   CREATININE 0.55* 0.65 0.63 0.62 0.56*  CALCIUM 8.5* 8.5* 8.5* 8.6* 8.5*    GFR: Estimated Creatinine Clearance: 157.3 mL/min (A) (by C-G formula based on SCr of 0.56 mg/dL (L)).  Liver Function Tests: Recent Labs  Lab 01/07/21 0525 01/08/21 0614 01/09/21 0403 01/10/21 0449 01/11/21 0549  AST 58* 82* 79* 61* 40  ALT 80* 103* 112* 98* 80*  ALKPHOS 77 74 77 76 72  BILITOT 0.7 0.6 0.4 0.8 0.3  PROT 8.3* 7.9 7.6  7.8 7.5  ALBUMIN 2.4* 2.4* 2.4* 2.5* 2.5*    CBG: No results for input(s): GLUCAP in the last 168 hours.  Recent Results (from the past 240 hour(s))  Culture, blood (Routine X 2) w Reflex to ID Panel     Status: None   Collection Time: 01/06/21 11:07 AM   Specimen: BLOOD  Result Value Ref Range Status   Specimen Description BLOOD  Final   Special Requests NONE  Final   Culture   Final    NO GROWTH 5 DAYS Performed at [facility], [address], [place], [place] [address]    Report Status 01/11/2021 FINAL  Final  Culture, blood (Routine X 2) w Reflex to ID Panel     Status: None   Collection Time: 01/06/21 11:07 AM   Specimen: BLOOD  Result Value Ref Range Status   Specimen Description BLOOD BLOOD RIGHT HAND  Final   Special Requests   Final    BOTTLES DRAWN AEROBIC AND ANAEROBIC Blood Culture adequate volume   Culture   Final    NO GROWTH 5 DAYS Performed at [facility], [address] [place], [place] [address]    Report Status 01/11/2021 FINAL  Final     Radiology Studies: No results found. Scheduled Meds: . buprenorphine-naloxone  2 tablet Sublingual Q1200  . buprenorphine-naloxone  1 tablet Sublingual BID  . cholecalciferol  2,000 Units  Oral Daily  . enoxaparin (LOVENOX) injection  40 mg Subcutaneous Q24H  . nicotine  21 mg Transdermal Daily  . nystatin ointment   Topical BID   And  . triamcinolone ointment   Topical BID  . pantoprazole  40 mg Oral QPM  . polyethylene glycol  17 g Oral Daily  . senna-docusate  2 tablet Oral QHS   Continuous Infusions: . sodium chloride 10 mL/hr at 01/11/21 1651  . vancomycin 1,000 mg (01/12/21 0601)    LOS: 15 days   Shon Hale, MD How to contact the [facility] Attending or Consulting provider 7A - 7P or covering provider during after hours 7P -7A, for this patient?  1. Check the care team in [facility] and look for a) attending/consulting TRH provider listed and b) the [facility] team listed 2. Log into [email or web address] and use [place]'s universal password to access. If you do not have the password, please contact the hospital operator. 3. Locate the [facility] provider you are looking for under [facility] and page to a number that you can be directly reached. 4. If you still have difficulty reaching the provider, please page the [facility] (Director on Call) for the Hospitalists listed on amion for assistance.  01/12/2021, 12:03 PM   _____________________________________________

## 2021-01-13 ENCOUNTER — Inpatient Hospital Stay (HOSPITAL_COMMUNITY): Payer: Medicaid Other

## 2021-01-13 DIAGNOSIS — R7881 Bacteremia: Secondary | ICD-10-CM | POA: Diagnosis not present

## 2021-01-13 DIAGNOSIS — B9562 Methicillin resistant Staphylococcus aureus infection as the cause of diseases classified elsewhere: Secondary | ICD-10-CM | POA: Diagnosis not present

## 2021-01-13 LAB — CBC
HCT: 38.5 % — ABNORMAL LOW (ref 39.0–52.0)
Hemoglobin: 12.2 g/dL — ABNORMAL LOW (ref 13.0–17.0)
MCH: 29.2 pg (ref 26.0–34.0)
MCHC: 31.7 g/dL (ref 30.0–36.0)
MCV: 92.1 fL (ref 80.0–100.0)
Platelets: 552 10*3/uL — ABNORMAL HIGH (ref 150–400)
RBC: 4.18 MIL/uL — ABNORMAL LOW (ref 4.22–5.81)
RDW: 13.2 % (ref 11.5–15.5)
WBC: 7.1 10*3/uL (ref 4.0–10.5)
nRBC: 0 % (ref 0.0–0.2)

## 2021-01-13 LAB — COMPREHENSIVE METABOLIC PANEL
ALT: 64 U/L — ABNORMAL HIGH (ref 0–44)
AST: 34 U/L (ref 15–41)
Albumin: 2.7 g/dL — ABNORMAL LOW (ref 3.5–5.0)
Alkaline Phosphatase: 76 U/L (ref 38–126)
Anion gap: 9 (ref 5–15)
BUN: 10 mg/dL (ref 6–20)
CO2: 29 mmol/L (ref 22–32)
Calcium: 8.7 mg/dL — ABNORMAL LOW (ref 8.9–10.3)
Chloride: 98 mmol/L (ref 98–111)
Creatinine, Ser: 0.55 mg/dL — ABNORMAL LOW (ref 0.61–1.24)
GFR, Estimated: >60 mL/min (ref >60)
Glucose, Bld: 96 mg/dL (ref 70–99)
Potassium: 3.8 mmol/L (ref 3.5–5.1)
Sodium: 136 mmol/L (ref 135–145)
Total Bilirubin: 0.5 mg/dL (ref 0.3–1.2)
Total Protein: 7.9 g/dL (ref 6.5–8.1)

## 2021-01-13 NOTE — Progress Notes (Signed)
PROGRESS NOTE   George Lucas  DQQ:[id number] DOB: 1991/06/12 DOA: 12/28/2020 PCP: System, Provider Not In   Chief Complaint  Patient presents with  . Chest Pain   Level of care: Med-Surg  Brief Admission History:  30 y.o. male with medical history significant of nicotine dependence, IVDU presented to ED with a complaint of chest pain.  Currently patient, he woke up at 10 o'clock in the morning with the chest pain.  He then had fever of 104 at around 2:30 AM.  He also started having some dry cough.  He arrived to the ED at around 2 PM as he did not have a ride.  No other complaint.    ED Course: Upon arrival to ED, he was tachycardic and tachypneic and afebrile.  CBC showed white cells of 17.  BMP showed hyponatremia and hyperglycemia.  Chest x-ray unremarkable however CT angiogram of the chest showed multifocal pneumonia and septic emboli.  Patient received vancomycin in the ED as well as 1 L of IV fluid bolus.  Hospitalist service were consulted to admit the patient for further management for possible bacterial endocarditis.  Assessment & Plan:   Principal Problem:   MRSA bacteremia----IVDU Active Problems:   MRSA Severe Sepsis (HCC)   MRSA Septic Embolism /IVDU   MRSA Bacteremia and Sepsis and IVDU   Heroin addiction ---IVDU   MRSA Multifocal pneumonia   Acute hyponatremia   Opiate Dependence/IVDU    Hepatitis-C----IVDU   Infection of skin   1)Severe Sepsis from MRSA Bacteremia, and Multifocal Pneumonia with Bil Pleural Effusions (Rt> Lt) and Cellulitis of L knuckle -- - Continue IV vancomcycin per ID recommendation, TEE did not show vegetations.  --Pt  Spiked fever up  to 101.2 on 01/06/21, -Otherwise has been afebrile -Blood cultures from 12/28/2020, 12/29/2020 and 12/30/2020 all grew MRSA -Repeat blood cultures from 01/01/2021 and 01/06/2021 NGTD -ID recommending to keep in hospital for 2 weeks of IV therapy from date of first negative culture which was 01/01/2021 ,anticipate that  last day of iv Vancomycin in the hospital will be around 01/15/2021 -- -Discussed with Dr Daiva Eves (ID Physician) on 01/12/21 he advised dc home on Thursday, 01/15/2021 --on p.o. doxycycline 100 mg twice daily for 4 weeks .  Patient will need ID follow-up as outpatient in about 10 days post discharge  -Leukocytosis has resolved as of 01/08/2021 -Follow-up on 01/29/2021 at 9 AM with Dr. Daiva Eves from infectious disease as outpatient for MRSA bacteremia and hepatitis C management   2)Skin infection of L 2nd digit knuckle/cellulitis -Mostly resolved conference with ID--continue IV Vanco as above #1  3)Hyponatremia/Hypochloremia- improving  4)Hypokalemia - resolved    5)History of IV drug abuse -  ID recommending to keep in hospital for 2 weeks of IV therapy followed by long acting therapy--please see 1 above  6)Mild R Chest pain during inspiration - resolved 01/06/21, repeat CXR 01/06/21 w/ bil pleural effusions Rt > Lt  7)HCV - HCV quant elevated to 95k and reflex genotype revealed HCV type 1a. ID following -Outpatient ID follow up at [facility] advised- --Follow-up on 01/29/2021 at 9 AM with Dr. Daiva Eves from infectious disease as outpatient for MRSA bacteremia and hepatitis C management  8)Opioid Dependence - Pt was having withdrawal symptoms.  Discussed with pharm D.  Increased suboxone to TID with noontime dose added on 5/15.   -Patient will need Suboxone Rx upon discharge  9)Opioid induced constipation--- C/n  laxatives.  10)Acute Anemia -in the setting  of acute infection and IV hydration -Hemoglobin currently stable around 12 -No evidence of ongoing bleeding  DVT prophylaxis: enoxaparin  Code Status: full  Family Communication:  Updated wife   Disposition: --Discussed with Dr Daiva EvesVan Dam (ID Physician) on 01/12/21 he advised dc home on Thursday, 01/15/2021 --on p.o. doxycycline 100 mg twice daily for 4 weeks .  Patient will need ID follow-up as outpatient in about 10 days post discharge  --Follow-up on 01/29/2021 at 9 AM with Dr. Daiva EvesVan Dam from infectious disease as outpatient for MRSA bacteremia and hepatitis C management  Status is: Inpatient  Remains inpatient appropriate because:Please see disposition above  Dispo: The patient is from: Home              Anticipated d/c is to: Home around 01/15/21 on po doxycycline x 1 month              Patient currently is not medically stable to d/c.   Difficult to place patient No  Consultants:   ID for sepsis and HCV, appreciate recs  Cardiology, appreciate recs  Pharmacy, following Vanc dosage and titers  Procedures:   TEE on 5/13, no abnormalities or vegetations noted  Antimicrobials:  Vancomycin  5/10>>  Subjective: -No further fevers, no chills overall cooperative -Eating and drinking well -Looking forward to discharge on Thursday, 01/15/2021 on oral doxycycline with outpatient follow-up with Dr. Daiva EvesVan Dam- -Follow-up on 01/29/2021 at 9 AM with Dr. Daiva EvesVan Dam from infectious disease as outpatient for MRSA bacteremia and hepatitis C management  Objective: Vitals:   01/12/21 1435 01/12/21 2206 01/13/21 0345 01/13/21 1347  BP: 99/71 105/75 100/70 111/69  Pulse: 82 92 79 85  Resp: 17  19 16   Temp: 98.3 F (36.8 C) 99.4 F (37.4 C) 98.3 F (36.8 C) 98.3 F (36.8 C)  TempSrc: Oral Oral  Oral  SpO2: 98% 98% 99% 96%  Weight:      Height:        Intake/Output Summary (Last 24 hours) at 01/13/2021 1759 Last data filed at 01/13/2021 1402 Gross per 24 hour  Intake 480 ml  Output 1050 ml  Net -570 ml   Filed Weights   12/28/20 1702  Weight: 81.6 kg    Examination:  General exam: Awake, alert, no acute distress Respiratory system: Diminished breath sounds, right more than left, no wheezing  cardiovascular system: normal S1 & S2 heard. No JVD,  Gastrointestinal system: Abdomen is nondistended and soft. . Normal bowel sounds heard. Central nervous system: Alert and oriented. No focal neurological deficits. Extremities:  Pt spontaneously moving all extremities with full strength and ROM, good pedal pulses Skin: Warm and dry Psychiatry: Judgement and insight appear appropriate, mood & affect appropriate.   Data Reviewed: I have personally reviewed following labs and imaging studies  CBC: Recent Labs  Lab 01/07/21 0525 01/08/21 0614 01/09/21 0403 01/10/21 0449 01/11/21 0549 01/13/21 0415  WBC 11.1* 9.7 9.2 7.7 7.0 7.1  NEUTROABS 6.6 5.2 4.5 3.2 3.3  --   HGB 13.3 12.2* 12.1* 13.2 12.5* 12.2*  HCT 41.0 38.1* 37.8* 41.6 38.9* 38.5*  MCV 89.7 90.9 90.9 93.1 91.3 92.1  PLT 591* 612* 631* 620* 584* 552*    Basic Metabolic Panel: Recent Labs  Lab 01/08/21 0614 01/09/21 0403 01/10/21 0449 01/11/21 0549 01/13/21 0415  NA 133* 134* 134* 134* 136  K 4.0 4.0 4.7 3.7 3.8  CL 94* 96* 97* 97* 98  CO2 31 30 30 30 29   GLUCOSE 104* 105* 103* 107*  96  BUN 10 10 10 8 10   CREATININE 0.65 0.63 0.62 0.56* 0.55*  CALCIUM 8.5* 8.5* 8.6* 8.5* 8.7*    GFR: Estimated Creatinine Clearance: 157.3 mL/min (A) (by C-G formula based on SCr of 0.55 mg/dL (L)).  Liver Function Tests: Recent Labs  Lab 01/08/21 0614 01/09/21 0403 01/10/21 0449 01/11/21 0549 01/13/21 0415  AST 82* 79* 61* 40 34  ALT 103* 112* 98* 80* 64*  ALKPHOS 74 77 76 72 76  BILITOT 0.6 0.4 0.8 0.3 0.5  PROT 7.9 7.6 7.8 7.5 7.9  ALBUMIN 2.4* 2.4* 2.5* 2.5* 2.7*    CBG: No results for input(s): GLUCAP in the last 168 hours.  Recent Results (from the past 240 hour(s))  Culture, blood (Routine X 2) w Reflex to ID Panel     Status: None   Collection Time: 01/06/21 11:07 AM   Specimen: BLOOD  Result Value Ref Range Status   Specimen Description BLOOD  Final   Special Requests NONE  Final   Culture   Final    NO GROWTH 5 DAYS Performed at [facility], [address], [place], [place]    Report Status 01/11/2021 FINAL  Final  Culture, blood (Routine X 2) w Reflex to ID Panel     Status: None   Collection Time: 01/06/21  11:07 AM   Specimen: BLOOD  Result Value Ref Range Status   Specimen Description BLOOD BLOOD RIGHT HAND  Final   Special Requests   Final    BOTTLES DRAWN AEROBIC AND ANAEROBIC Blood Culture adequate volume   Culture   Final    NO GROWTH 5 DAYS Performed at [facility], [address], [place], [place]    Report Status 01/11/2021 FINAL  Final     Radiology Studies: DG Chest 2 View  Result Date: 01/13/2021 CLINICAL DATA:  Shortness of breath. EXAM: CHEST - 2 VIEW COMPARISON:  01/06/2021.  CT 12/28/2020. FINDINGS: Mediastinum and hilar structures normal. Heart size normal. Persistent density noted the periphery of the right mid lung. Persistent bilateral subsegmental atelectasis. Pulmonary nodular densities best identified on prior CT. Small right pleural effusion, improved from prior exam. No pneumothorax. IMPRESSION: 1. Persistent density noted in the periphery of the right mid lung. Persistent bilateral subsegmental atelectasis. Pulmonary nodular densities best identified by prior CT. 2.  Small right pleural effusion, improved from prior exam. Electronically Signed   By: 02/27/2021  Register   On: 01/13/2021 05:42   Scheduled Meds: . buprenorphine-naloxone  2 tablet Sublingual Q1200  . buprenorphine-naloxone  1 tablet Sublingual BID  . cholecalciferol  2,000 Units Oral Daily  . enoxaparin (LOVENOX) injection  40 mg Subcutaneous Q24H  . nicotine  21 mg Transdermal Daily  . nystatin ointment   Topical BID   And  . triamcinolone ointment   Topical BID  . pantoprazole  40 mg Oral QPM  . polyethylene glycol  17 g Oral Daily  . senna-docusate  2 tablet Oral QHS   Continuous Infusions: . sodium chloride 10 mL/hr at 01/11/21 1651  . vancomycin 1,000 mg (01/13/21 1312)    LOS: 16 days   01/15/21, MD How to contact the [facility] Attending or Consulting provider 7A - 7P or covering provider during after hours 7P -7A, for this patient?  1. Check the care team in [facility] and look for a)  attending/consulting TRH provider listed and b) the [facility] team listed 2. Log into [email or web address] and use [place]'s universal password to access. If  you do not have the password, please contact the hospital operator. 3. Locate the [facility] provider you are looking for under [facility] and page to a number that you can be directly reached. 4. If you still have difficulty reaching the provider, please page the [facility] (Director on Call) for the Hospitalists listed on amion for assistance.  01/13/2021, 5:59 PM   _____________________________________________

## 2021-01-14 ENCOUNTER — Other Ambulatory Visit (HOSPITAL_COMMUNITY): Payer: Self-pay

## 2021-01-14 MED ORDER — DOXYCYCLINE HYCLATE 100 MG PO TABS
100.0000 mg | ORAL_TABLET | Freq: Two times a day (BID) | ORAL | 0 refills | Status: DC
  Filled 2021-01-14: qty 56, 28d supply, fill #0

## 2021-01-14 NOTE — Progress Notes (Signed)
PROGRESS NOTE   George Lucas  ZOX:[id number] DOB: 10/28/1990 DOA: 12/28/2020 PCP: System, Provider Not In   Chief Complaint  Patient presents with  . Chest Pain   Level of care: Med-Surg  Brief Admission History:  30 y.o. male with medical history significant of nicotine dependence, IVDU presented to ED with a complaint of chest pain.  Currently patient, he woke up at 10 o'clock in the morning with the chest pain.  He then had fever of 104 at around 2:30 AM.  He also started having some dry cough.  He arrived to the ED at around 2 PM as he did not have a ride.  No other complaint.    ED Course: Upon arrival to ED, he was tachycardic and tachypneic and afebrile.  CBC showed white cells of 17.  BMP showed hyponatremia and hyperglycemia.  Chest x-ray unremarkable however CT angiogram of the chest showed multifocal pneumonia and septic emboli.  Patient received vancomycin in the ED as well as 1 L of IV fluid bolus.  Hospitalist service were consulted to admit the patient for further management for possible bacterial endocarditis.   Subjective: The patient was seen and examined, no acute distress hemodynamically stable No issues overnight  -Looking forward to discharge on Thursday, 01/15/2021 on oral doxycycline with outpatient follow-up with Dr. Daiva EvesVan Dam- -Follow-up on 01/29/2021 at 9 AM with Dr. Daiva EvesVan Dam from infectious disease as outpatient for MRSA bacteremia and hepatitis C management     Assessment & Plan:   Principal Problem:   MRSA bacteremia----IVDU Active Problems:   MRSA Multifocal pneumonia   MRSA Severe Sepsis (HCC)   MRSA Septic Embolism /IVDU   Acute hyponatremia   MRSA Bacteremia and Sepsis and IVDU   Infection of skin   Heroin addiction ---IVDU   Opiate Dependence/IVDU    Hepatitis-C----IVDU   1)Severe Sepsis from MRSA Bacteremia, and Multifocal Pneumonia with Bil Pleural Effusions (Rt> Lt) and Cellulitis of L knuckle -- - Continue IV vancomcycin per ID  recommendation, TEE did not show vegetations.  --Pt  Spiked fever up  to 101.2 on 01/06/21, -remains afebrile, normotensive -Blood cultures from 12/28/2020, 12/29/2020 and 12/30/2020 all grew MRSA -Repeat blood cultures from 01/01/2021 and 01/06/2021 NGTD -ID recommending to keep in hospital for 2 weeks of IV therapy from date of first negative culture which was 01/01/2021 ,anticipate that last day of iv Vancomycin in the hospital will be around 01/15/2021 -- -Discussed with Dr Daiva EvesVan Dam (ID Physician) on 01/12/21 he advised D/C home on Thursday, 01/15/2021 --on p.o. doxycycline 100 mg twice daily for 4 weeks .   -Leukocytosis has resolved as of 01/08/2021 -Follow-up on 01/29/2021 at 9 AM with Dr. Daiva EvesVan Dam from infectious disease as outpatient for MRSA bacteremia and hepatitis C management   2)Skin infection of L 2nd digit knuckle/cellulitis -Resolved, conference with ID--continue IV Vanco as above #1  3)Hyponatremia/Hypochloremia-resolved,  4)Hypokalemia -resolved,  5)History of IV drug abuse -  ID recommending to keep in hospital for 2 weeks of IV therapy followed by long acting therapy--please see 1 above  6)Mild R Chest pain during inspiration - resolved 01/06/21, repeat CXR 01/06/21 w/ bil pleural effusions Rt > Lt  7)HCV - HCV quant elevated to 95k and reflex genotype revealed HCV type 1a. ID following -Outpatient ID follow up at the [facility] advised- --Follow-up on 01/29/2021 at 9 AM with Dr. Daiva EvesVan Dam from infectious disease as outpatient for MRSA bacteremia and hepatitis C management  8)Opioid Dependence - Pt was  having withdrawal symptoms.  Discussed with pharm D.  Increased suboxone to TID with noontime dose added on 5/15.   -Patient will need Suboxone Rx upon discharge  9)Opioid induced constipation--- C/n  laxatives.  10)Acute Anemia -in the setting of acute infection and IV hydration -Hemoglobin currently stable around 12 -No evidence of ongoing bleeding  DVT prophylaxis: enoxaparin   Code Status: full  Family Communication:  Updated wife   Disposition: --Discussed with Dr Daiva Eves (ID Physician) on 01/12/21 he advised dc home on Thursday, 01/15/2021 --on p.o. doxycycline 100 mg twice daily for 4 weeks .  Patient will need ID follow-up as outpatient in about 10 days post discharge --Follow-up on 01/29/2021 at 9 AM with Dr. Daiva Eves from infectious disease as outpatient for MRSA bacteremia and hepatitis C management  Status is: Inpatient  Remains inpatient appropriate because:Please see disposition above  Dispo: The patient is from: Home              Anticipated d/c is to: Home around 01/15/21 on po doxycycline x 1 month              Patient currently is not medically stable to d/c.   Difficult to place patient No  Consultants:   ID for sepsis and HCV, appreciate recs  Cardiology, appreciate recs  Pharmacy, following Vanc dosage and titers  Procedures:   TEE on 5/13, no abnormalities or vegetations noted  Antimicrobials:  Vancomycin  5/10>>    Objective: Vitals:   01/13/21 1347 01/13/21 2059 01/14/21 0526 01/14/21 1327  BP: 111/69 120/86 107/69 104/70  Pulse: 85 (!) 105 79 79  Resp: 16 20 18 18   Temp: 98.3 F (36.8 C) 98 F (36.7 C) 98.8 F (37.1 C) 98.1 F (36.7 C)  TempSrc: Oral   Oral  SpO2: 96% 99% 92% 99%  Weight:      Height:        Intake/Output Summary (Last 24 hours) at 01/14/2021 1659 Last data filed at 01/14/2021 0500 Gross per 24 hour  Intake 480 ml  Output 2000 ml  Net -1520 ml   Filed Weights   12/28/20 1702  Weight: 81.6 kg      Physical Exam:   General:  Alert, oriented, cooperative, no distress;   HEENT:  Normocephalic, PERRL, otherwise with in Normal limits   Neuro:  CNII-XII intact. , normal motor and sensation, reflexes intact   Lungs:   Clear to auscultation BL, Respirations unlabored, no wheezes / crackles  Cardio:    S1/S2, RRR, No murmure, No Rubs or Gallops   Abdomen:   Soft, non-tender, bowel sounds active all  four quadrants,  no guarding or peritoneal signs.  Muscular skeletal:  Limited exam - in bed, able to move all 4 extremities, Normal strength,  2+ pulses,  symmetric, No pitting edema  Skin:  Dry, warm to touch, negative for any Rashes,  Wounds: Please see nursing documentation           Data Reviewed: I have personally reviewed following labs and imaging studies  CBC: Recent Labs  Lab 01/08/21 0614 01/09/21 0403 01/10/21 0449 01/11/21 0549 01/13/21 0415  WBC 9.7 9.2 7.7 7.0 7.1  NEUTROABS 5.2 4.5 3.2 3.3  --   HGB 12.2* 12.1* 13.2 12.5* 12.2*  HCT 38.1* 37.8* 41.6 38.9* 38.5*  MCV 90.9 90.9 93.1 91.3 92.1  PLT 612* 631* 620* 584* 552*    Basic Metabolic Panel: Recent Labs  Lab 01/08/21 0614 01/09/21 0403 01/10/21 0449 01/11/21  0549 01/13/21 0415  NA 133* 134* 134* 134* 136  K 4.0 4.0 4.7 3.7 3.8  CL 94* 96* 97* 97* 98  CO2 31 30 30 30 29   GLUCOSE 104* 105* 103* 107* 96  BUN 10 10 10 8 10   CREATININE 0.65 0.63 0.62 0.56* 0.55*  CALCIUM 8.5* 8.5* 8.6* 8.5* 8.7*    GFR: Estimated Creatinine Clearance: 157.3 mL/min (A) (by C-G formula based on SCr of 0.55 mg/dL (L)).  Liver Function Tests: Recent Labs  Lab 01/08/21 0614 01/09/21 0403 01/10/21 0449 01/11/21 0549 01/13/21 0415  AST 82* 79* 61* 40 34  ALT 103* 112* 98* 80* 64*  ALKPHOS 74 77 76 72 76  BILITOT 0.6 0.4 0.8 0.3 0.5  PROT 7.9 7.6 7.8 7.5 7.9  ALBUMIN 2.4* 2.4* 2.5* 2.5* 2.7*    CBG: No results for input(s): GLUCAP in the last 168 hours.  Recent Results (from the past 240 hour(s))  Culture, blood (Routine X 2) w Reflex to ID Panel     Status: None   Collection Time: 01/06/21 11:07 AM   Specimen: BLOOD  Result Value Ref Range Status   Specimen Description BLOOD  Final   Special Requests NONE  Final   Culture   Final    NO GROWTH 5 DAYS Performed at [facility], [address] [place], [address] [place]    Report Status 01/11/2021 FINAL  Final  Culture, blood (Routine X 2) w Reflex  to ID Panel     Status: None   Collection Time: 01/06/21 11:07 AM   Specimen: BLOOD  Result Value Ref Range Status   Specimen Description BLOOD BLOOD RIGHT HAND  Final   Special Requests   Final    BOTTLES DRAWN AEROBIC AND ANAEROBIC Blood Culture adequate volume   Culture   Final    NO GROWTH 5 DAYS Performed at [facility], [address] [place], [address] [place]    Report Status 01/11/2021 FINAL  Final     Radiology Studies: DG Chest 2 View  Result Date: 01/13/2021 CLINICAL DATA:  Shortness of breath. EXAM: CHEST - 2 VIEW COMPARISON:  01/06/2021.  CT 12/28/2020. FINDINGS: Mediastinum and hilar structures normal. Heart size normal. Persistent density noted the periphery of the right mid lung. Persistent bilateral subsegmental atelectasis. Pulmonary nodular densities best identified on prior CT. Small right pleural effusion, improved from prior exam. No pneumothorax. IMPRESSION: 1. Persistent density noted in the periphery of the right mid lung. Persistent bilateral subsegmental atelectasis. Pulmonary nodular densities best identified by prior CT. 2.  Small right pleural effusion, improved from prior exam. Electronically Signed   By: 01/08/2021  Register   On: 01/13/2021 05:42   Scheduled Meds: . buprenorphine-naloxone  2 tablet Sublingual Q1200  . buprenorphine-naloxone  1 tablet Sublingual BID  . cholecalciferol  2,000 Units Oral Daily  . enoxaparin (LOVENOX) injection  40 mg Subcutaneous Q24H  . nicotine  21 mg Transdermal Daily  . nystatin ointment   Topical BID   And  . triamcinolone ointment   Topical BID  . pantoprazole  40 mg Oral QPM  . polyethylene glycol  17 g Oral Daily  . senna-docusate  2 tablet Oral QHS   Continuous Infusions: . sodium chloride 10 mL/hr at 01/11/21 1651  . vancomycin 1,000 mg (01/14/21 1258)    LOS: 17 days   01/13/21, MD How to contact the [facility] Attending or Consulting provider 7A - 7P or covering provider during after hours 7P -7A, for  this patient?  1. Check the care team in [facility] and look for a) attending/consulting TRH provider listed and b) the [facility] team listed 2. Log into [email or web address] and use [place]'s universal password to access. If you do not have the password, please contact the hospital operator. 3. Locate the [facility] provider you are looking for under [facility] and page to a number that you can be directly reached. 4. If you still have difficulty reaching the provider, please page the [facility] (Director on Call) for the Hospitalists listed on amion for assistance.  01/14/2021, 4:59 PM   _____________________________________________

## 2021-01-15 ENCOUNTER — Other Ambulatory Visit (HOSPITAL_COMMUNITY): Payer: Self-pay

## 2021-01-15 LAB — BASIC METABOLIC PANEL
Anion gap: 9 (ref 5–15)
BUN: 9 mg/dL (ref 6–20)
CO2: 31 mmol/L (ref 22–32)
Calcium: 8.9 mg/dL (ref 8.9–10.3)
Chloride: 97 mmol/L — ABNORMAL LOW (ref 98–111)
Creatinine, Ser: 0.64 mg/dL (ref 0.61–1.24)
GFR, Estimated: >60 mL/min (ref >60)
Glucose, Bld: 108 mg/dL — ABNORMAL HIGH (ref 70–99)
Potassium: 3.6 mmol/L (ref 3.5–5.1)
Sodium: 137 mmol/L (ref 135–145)

## 2021-01-15 LAB — CBC
HCT: 39.7 % (ref 39.0–52.0)
Hemoglobin: 12.6 g/dL — ABNORMAL LOW (ref 13.0–17.0)
MCH: 29.6 pg (ref 26.0–34.0)
MCHC: 31.7 g/dL (ref 30.0–36.0)
MCV: 93.2 fL (ref 80.0–100.0)
Platelets: 471 10*3/uL — ABNORMAL HIGH (ref 150–400)
RBC: 4.26 MIL/uL (ref 4.22–5.81)
RDW: 13.2 % (ref 11.5–15.5)
WBC: 7.1 10*3/uL (ref 4.0–10.5)
nRBC: 0 % (ref 0.0–0.2)

## 2021-01-15 MED ORDER — ACETAMINOPHEN 325 MG PO TABS: 650.0000 mg | ORAL_TABLET | Freq: Four times a day (QID) | ORAL | 0 refills | Status: AC | PRN

## 2021-01-15 MED ORDER — PANTOPRAZOLE SODIUM 40 MG PO TBEC: 40.0000 mg | DELAYED_RELEASE_TABLET | Freq: Every evening | ORAL | 0 refills | Status: AC

## 2021-01-15 MED ORDER — RISAQUAD PO CAPS: 1.0000 | ORAL_CAPSULE | Freq: Three times a day (TID) | ORAL | 0 refills | Status: AC

## 2021-01-15 MED ORDER — NICOTINE 21 MG/24HR TD PT24: 21.0000 mg | MEDICATED_PATCH | Freq: Every day | TRANSDERMAL | 0 refills | Status: AC

## 2021-01-15 MED ORDER — TRIAMCINOLONE ACETONIDE 0.1 % EX OINT: TOPICAL_OINTMENT | Freq: Two times a day (BID) | CUTANEOUS | 0 refills | Status: AC

## 2021-01-15 MED ORDER — VITAMIN D3 25 MCG PO TABS: 2000.0000 [IU] | ORAL_TABLET | Freq: Every day | ORAL | 0 refills | Status: AC

## 2021-01-15 MED ORDER — DOXYCYCLINE HYCLATE 100 MG PO TABS: 100.0000 mg | ORAL_TABLET | Freq: Two times a day (BID) | ORAL | 0 refills | Status: AC

## 2021-01-15 MED ORDER — RISAQUAD PO CAPS: 1.0000 | ORAL_CAPSULE | Freq: Three times a day (TID) | ORAL | Status: DC

## 2021-01-15 MED ORDER — NYSTATIN 100000 UNIT/GM EX OINT: TOPICAL_OINTMENT | Freq: Two times a day (BID) | CUTANEOUS | 0 refills | Status: AC

## 2021-01-15 NOTE — TOC Transition Note (Signed)
Transition of Care [facility]) - CM/SW Discharge Note   Patient Details  Name: George Lucas MRN: [id number] Date of Birth: 1991-07-17  Transition of Care [facility]) CM/SW Contact:  Barry Brunner, LCSW Phone Number: 01/15/2021, 11:16 AM   Clinical Narrative:    CSW notified of patient's readiness for discharger and consult needs. CSW attempted to meet patient in room to provide PCP and SA resources. Patient' discharged before resources provided. CSW placed call to patient to request email to provide resources. CSW LVM. TOC signing off.   Final next level of care: Home/Self Care Barriers to Discharge: Barriers Resolved   Patient Goals and CMS Choice Patient states their goals for this hospitalization and ongoing recovery are:: Return home CMS Medicare.gov Compare Post Acute Care list provided to:: Patient Choice offered to / list presented to : Patient  Discharge Placement                    Patient and family notified of of transfer: 01/15/21  Discharge Plan and Services In-house Referral: Clinical Social Work                                   Social Determinants of Health (SDOH) Interventions     Readmission Risk Interventions No flowsheet data found.

## 2021-01-15 NOTE — Discharge Summary (Signed)
Physician Discharge Summary [facility]    Patient: George Lucas                   Admit date: 12/28/2020   DOB: 1991-02-15             Discharge date:01/15/2021/8:42 AM OZH:[id number]                          PCP: System, Provider Not In  Disposition: HOME   Recommendations for Outpatient Follow-up:   . Follow up: Dr Daiva Eves (ID Physician) on 01/12/21 he advised D/C home on Thursday, 01/15/2021 --on p.o. doxycycline 100 mg twice daily for 4 weeks .  Discharge Condition: Stable   Code Status:   Code Status: Full Code  Diet recommendation: Regular healthy diet   Discharge Diagnoses:    Principal Problem:   MRSA bacteremia----IVDU Active Problems:   MRSA Multifocal pneumonia   MRSA Severe Sepsis (HCC)   MRSA Septic Embolism /IVDU   Acute hyponatremia   MRSA Bacteremia and Sepsis and IVDU   Infection of skin   Heroin addiction ---IVDU   Opiate Dependence/IVDU    Hepatitis-C----IVDU   History of Present Illness/ Hospital Course [name] Summary:   29 y.o.malewith medical history significant ofnicotine dependence, IVDU presented to ED with a complaint of chest pain. Currently patient, he woke up at 10 o'clock in the morning with the chest pain. He then had fever of 104 at around 2:30 AM. He also started having some dry cough. He arrived to the ED at around 2 PM as he did not have a ride. No other complaint.   ED Course:Upon arrival to ED, he was tachycardic and tachypneic and afebrile. CBC showed white cells of 17. BMP showed hyponatremia and hyperglycemia. Chest x-ray unremarkable however CT angiogram of the chest showed multifocal pneumonia and septic emboli. Patient received vancomycin in the ED as well as 1 L of IV fluid bolus. Hospitalist service were consulted to admit the patient for further management for possible bacterial endocarditis.  Hospital course:  1)Severe Sepsis from MRSA Bacteremia, and Multifocal Pneumonia with Bil Pleural  Effusions (Rt> Lt) and Cellulitis of L knuckle -- - Continue IV vancomcycin per ID recommendation, TEE did not show vegetations.  --Pt  Spiked fever up  to 101.2 on 01/06/21, -remains afebrile, normotensive -Blood cultures from 12/28/2020, 12/29/2020 and 12/30/2020 all grew MRSA -Repeat blood cultures from 01/01/2021 and 01/06/2021 NGTD -ID recommending to keep in hospital for 2 weeks of IV therapy from date of first negative culture which was 01/01/2021 ,anticipate that last day of iv Vancomycin in the hospital will be around 01/15/2021 -- -Discussed with Dr Daiva Eves (ID Physician) on 01/12/21 he advised D/C home on Thursday, 01/15/2021 --on p.o. doxycycline 100 mg twice daily for 4 weeks .   -Leukocytosis has resolved as of 01/08/2021 -Follow-up on 01/29/2021 at 9 AM with Dr. Daiva Eves from infectious disease as outpatient for MRSA bacteremia and hepatitis C management   2)Skin infection of L 2nd digit knuckle/cellulitis -Resolved, conference with ID--continue IV Vanco as above #1  3)Hyponatremia/Hypochloremia-resolved,  4)Hypokalemia -resolved,  5)History of IV drug abuse -  ID recommending to keep in hospital for 2 weeks of IV therapy followed by long acting therapy--please see 1 above  6)Mild R Chest pain during inspiration - resolved 01/06/21, repeat CXR 01/06/21 w/ bil pleural effusions Rt > Lt  7)HCV - HCV quant elevated to 95k and reflex genotype revealed  HCV type 1a. ID following -Outpatient ID follow up at the [facility] advised- --Follow-up on 01/29/2021 at 9 AM with Dr. Daiva Eves from infectious disease as outpatient for MRSA bacteremia and hepatitis C management  8)Opioid Dependence - Pt was having withdrawal symptoms.  Discussed with pharm D.  Increased suboxone to TID with noontime dose added on 5/15.   -Patient will need Suboxone Rx upon discharge  9)Opioid induced constipation--- C/n  laxatives.  10)Acute Anemia -in the setting of acute infection and IV hydration -Hemoglobin  currently stable around 12 -No evidence of ongoing bleeding   Disposition:  Per Dr Daiva Eves (ID Physician) on 01/12/21 he advised dc home on Thursday, 01/15/2021 --on p.o. doxycycline 100 mg twice daily for 4 weeks .  Patient will need ID follow-up as outpatient in about 10 days post discharge --Follow-up on 01/29/2021 at 9 AM with Dr. Daiva Eves from infectious disease as outpatient for MRSA bacteremia and hepatitis C management       Consultants:   ID for sepsis and HCV, appreciate recs  Cardiology, appreciate recs  Pharmacy, following Vanc dosage and titers  Procedures:   TEE on 5/13, no abnormalities or vegetations noted  Antimicrobials:  Vancomycin  5/10>>01/15/21 Doxycycline p.o. 100 mg twice daily x1 month starting 01/16/2021   Discharge Instructions:   Discharge Instructions    Activity as tolerated - No restrictions   Complete by: As directed    Call MD for:  difficulty breathing, headache or visual disturbances   Complete by: As directed    Call MD for:  persistant dizziness or light-headedness   Complete by: As directed    Call MD for:  persistant nausea and vomiting   Complete by: As directed    Call MD for:  temperature >100.4   Complete by: As directed    Diet - low sodium heart healthy   Complete by: As directed    Discharge instructions   Complete by: As directed    Dr Daiva Eves (ID Physician)  --on p.o. doxycycline 100 mg twice daily for 4 weeks .   -Follow-up on 01/29/2021 at 9 AM with Dr. Daiva Eves from infectious disease as outpatient for MRSA bacteremia and hepatitis C management   Increase activity slowly   Complete by: As directed        Medication List    TAKE these medications   acetaminophen 325 MG tablet Commonly known as: TYLENOL Take 2 tablets (650 mg total) by mouth every 6 (six) hours as needed for mild pain (or Fever >/= 101).   acidophilus Caps capsule Take 1 capsule by mouth 3 (three) times daily with meals.    buprenorphine-naloxone 8-2 mg Subl SL tablet Commonly known as: SUBOXONE Place 1 tablet under the tongue 2 (two) times daily.   doxycycline 100 MG tablet Commonly known as: VIBRA-TABS Take 1 tablet (100 mg total) by mouth 2 (two) times daily for 28 days.   ibuprofen 600 MG tablet Commonly known as: ADVIL Take 1 tablet (600 mg total) by mouth every 6 (six) hours as needed. What changed:   how much to take  reasons to take this   nicotine 21 mg/24hr patch Commonly known as: NICODERM CQ - dosed in mg/24 hours Place 1 patch (21 mg total) onto the skin daily. Start taking on: Jan 16, 2021   nystatin ointment Commonly known as: MYCOSTATIN Apply topically 2 (two) times daily.   pantoprazole 40 MG tablet Commonly known as: PROTONIX Take 1 tablet (40  mg total) by mouth every evening.   ProAir HFA 108 (90 Base) MCG/ACT inhaler Generic drug: albuterol Inhale 2 puffs into the lungs every 6 (six) hours as needed.   triamcinolone ointment 0.1 % Commonly known as: KENALOG Apply topically 2 (two) times daily.   Vitamin D3 25 MCG tablet Commonly known as: Vitamin D Take 2 tablets (2,000 Units total) by mouth daily. Start taking on: Jan 16, 2021       Allergies  Allergen Reactions  . Sulfa Antibiotics Anaphylaxis and Rash     Procedures /Studies:   DG Chest 2 View  Result Date: 01/13/2021 CLINICAL DATA:  Shortness of breath. EXAM: CHEST - 2 VIEW COMPARISON:  01/06/2021.  CT 12/28/2020. FINDINGS: Mediastinum and hilar structures normal. Heart size normal. Persistent density noted the periphery of the right mid lung. Persistent bilateral subsegmental atelectasis. Pulmonary nodular densities best identified on prior CT. Small right pleural effusion, improved from prior exam. No pneumothorax. IMPRESSION: 1. Persistent density noted in the periphery of the right mid lung. Persistent bilateral subsegmental atelectasis. Pulmonary nodular densities best identified by prior CT. 2.   Small right pleural effusion, improved from prior exam. Electronically Signed   By: Maisie Fushomas  Register   On: 01/13/2021 05:42   DG Chest 2 View  Result Date: 01/06/2021 CLINICAL DATA:  Leukocytosis. EXAM: CHEST - 2 VIEW COMPARISON:  Jan 03, 2021. FINDINGS: The heart size and mediastinal contours are within normal limits. No pneumothorax is noted. Bilateral pleural effusions are again noted, right greater than left, with associated bibasilar atelectasis. The visualized skeletal structures are unremarkable. IMPRESSION: Continued presence of bilateral pleural effusions, right greater than left, with associated bibasilar atelectasis. Electronically Signed   By: Lupita RaiderJames  Green Jr M.D.   On: 01/06/2021 09:12   DG Chest 2 View  Result Date: 01/03/2021 CLINICAL DATA:  Chest pain, shortness of breath, liver disease EXAM: CHEST - 2 VIEW COMPARISON:  12/28/2020 FINDINGS: The heart and mediastinum are normal. Moderate right, small left pleural effusions and associated atelectasis or consolidation. Osseous structures are unremarkable. IMPRESSION: Moderate right, small left pleural effusions and associated atelectasis or consolidation. Electronically Signed   By: Lauralyn PrimesAlex  Bibbey M.D.   On: 01/03/2021 15:32   CT Angio Chest PE W and/or Wo Contrast  Result Date: 12/28/2020 CLINICAL DATA:  Chest pain beginning yesterday. EXAM: CT ANGIOGRAPHY CHEST WITH CONTRAST TECHNIQUE: Multidetector CT imaging of the chest was performed using the standard protocol during bolus administration of intravenous contrast. Multiplanar CT image reconstructions and MIPs were obtained to evaluate the vascular anatomy. CONTRAST:  100mL OMNIPAQUE IOHEXOL 350 MG/ML SOLN COMPARISON:  Current chest radiograph. FINDINGS: Cardiovascular: There is satisfactory opacification of the pulmonary arteries to the proximal segmental level. The smaller segmental vessels less well opacified and are also affected by some respiratory motion. Allowing for this minor  limitation, there is no evidence of a pulmonary embolism. Heart is normal in size and configuration. No pericardial effusion. Normal great vessels. Mediastinum/Nodes: Visualized thyroid is unremarkable. No neck base, axillary, mediastinal or hilar masses or enlarged lymph nodes. Trachea and esophagus are unremarkable. Lungs/Pleura: There are bilateral rounded areas of pleural based opacity with several additional small nodular opacities. Most prominent areas are in the right upper lobe, abutting the oblique and lateral pleural margins of the right lower lobe and abutting the oblique fissure the left lower lobe extending to the lung base. Differential diagnosis includes septic emboli and multifocal infection/inflammation. Mild paraseptal noted at the lung apices. No other abnormalities. No pleural  effusion or pneumothorax. Upper Abdomen: Unremarkable. Musculoskeletal: No chest wall abnormality. No acute or significant osseous findings. Review of the MIP images confirms the above findings. IMPRESSION: 1. No evidence of a pulmonary embolism. 2. Bilateral pleural based focal areas of lung consolidation in addition to multiple small nodules. Differential diagnosis includes septic emboli and multifocal infection/inflammation. Electronically Signed   By: Amie Portland M.D.   On: 12/28/2020 16:00   DG Chest Port 1 View  Result Date: 12/28/2020 CLINICAL DATA:  Chest pain EXAM: PORTABLE CHEST 1 VIEW COMPARISON:  Chest radiograph dated 03/11/2015. FINDINGS: The heart size and mediastinal contours are within normal limits. Both lungs are clear. The visualized skeletal structures are unremarkable. IMPRESSION: No active disease. Electronically Signed   By: Romona Curls M.D.   On: 12/28/2020 13:57   ECHOCARDIOGRAM COMPLETE  Result Date: 12/29/2020    ECHOCARDIOGRAM REPORT   Patient Name:   George Lucas [facility] Date of Exam: 12/29/2020 Medical Rec #:  [id number]         Height:       74.0 in Accession #:    [id number]         Weight:       180.0 lb Date of Birth:  15-Nov-1990          BSA:          2.078 m Patient Age:    30 years          BP:           129/83 mmHg Patient Gender: M                 HR:           87 bpm. [facility] Procedure: 2D Echo Indications:    Endocarditis I38  History:        Patient has no prior history of Echocardiogram examinations.                 Signs/Symptoms:Chest Pain and Fever; Risk Factors:Current                 Smoker. IVDU.  Sonographer:    Jeryl Columbia RDCS (AE) Referring Phys: [id number] Lamont Dowdy [facility]  Sonographer Comments: Patient complaining of pain throughout test. Moving and sitting up throughout test. IMPRESSIONS  1. Left ventricular ejection fraction, by estimation, is 55 to 60%. The left ventricle has normal function. Left ventricular endocardial border not optimally defined to evaluate regional wall motion. Left ventricular diastolic parameters were normal.  2. Right ventricular systolic function is normal. The right ventricular size is normal. Tricuspid regurgitation signal is inadequate for assessing PA pressure.  3. The mitral valve is abnormal, mildly thickened but without diagnostic vegetation. No evidence of mitral valve regurgitation.  4. The aortic valve was not well visualized. Aortic valve regurgitation is not visualized.  5. Unable to estimate CVP.  6. If there is high suspicion for endocarditis, TEE could be considered given overall limited views. FINDINGS  Left Ventricle: Left ventricular ejection fraction, by estimation, is 55 to 60%. The left ventricle has normal function. Left ventricular endocardial border not optimally defined to evaluate regional wall motion. The left ventricular internal cavity size was normal in size. There is no left ventricular hypertrophy. Left ventricular diastolic parameters were normal. Right Ventricle: The right ventricular size is normal. No increase in right ventricular wall thickness. Right ventricular systolic function is normal.  Tricuspid regurgitation signal is inadequate for assessing PA pressure. Left Atrium:  Left atrial size was normal in size. Right Atrium: Right atrial size was normal in size. Pericardium: There is no evidence of pericardial effusion. Mitral Valve: The mitral valve is abnormal. There is mild thickening of the mitral valve leaflet(s). No evidence of mitral valve regurgitation. Tricuspid Valve: The tricuspid valve is not well visualized. Tricuspid valve regurgitation is trivial. Aortic Valve: The aortic valve was not well visualized. Aortic valve regurgitation is not visualized. Pulmonic Valve: The pulmonic valve was grossly normal. Pulmonic valve regurgitation is trivial. Aorta: The aortic root is normal in size and structure. Venous: Unable to estimate CVP. The inferior vena cava was not well visualized. IAS/Shunts: No atrial level shunt detected by color flow Doppler.  LEFT VENTRICLE PLAX 2D LVIDd:         4.06 cm Diastology LVIDs:         2.62 cm LV e' medial:    11.40 cm/s LV PW:         1.07 cm LV E/e' medial:  8.7 LV IVS:        0.74 cm LV e' lateral:   17.70 cm/s                        LV E/e' lateral: 5.6  LEFT ATRIUM           Index LA diam:      2.50 cm 1.20 cm/m LA Vol (A4C): 45.2 ml 21.75 ml/m   AORTA Ao Root diam: 3.10 cm MITRAL VALVE MV Area (PHT): 2.58 cm MV Decel Time: 294 msec MV E velocity: 99.30 cm/s MV A velocity: 82.80 cm/s MV E/A ratio:  1.20 Nona Dell MD Electronically signed by Nona Dell MD Signature Date/Time: 12/29/2020/10:58:01 AM    Final    ECHO TEE  Result Date: 01/02/2021    TRANSESOPHOGEAL ECHO REPORT   Patient Name:   George Lucas [facility] Date of Exam: 01/02/2021 Medical Rec #:  [id number]         Height:       74.0 in Accession #:    [id number]        Weight:       180.0 lb Date of Birth:  11-17-1990          BSA:          2.078 m Patient Age:    30 years          BP:           111/63 mmHg Patient Gender: M                 HR:           78 bpm. [facility]  Procedure: Transesophageal Echo Indications:    Bacteremia  History:        Patient has prior history of Echocardiogram examinations, most                 recent 12/29/2020. Risk Factors:Current Smoker. IVDU, Multifocal                 Pneumonia, Sepsis, Septic Embolism.  Sonographer:    Jeryl Columbia RDCS (AE) Referring Phys: [id number] Ellsworth Lennox PROCEDURE: After discussion of the risks and benefits of a TEE, an informed consent was obtained from the patient. TEE procedure time was 8 minutes. The transesophogeal probe was passed without difficulty through the esophogus of the patient. Imaged were  obtained with the patient in a left lateral  decubitus position. Local oropharyngeal anesthetic was provided with viscous lidocaine. Sedation performed by different physician. The patient was monitored while under deep sedation. Anesthestetic sedation was provided intravenously by Anesthesiology:  of Propofol. Image quality was good. The patient's vital signs; including heart rate, blood pressure, and oxygen saturation; remained stable throughout the procedure. The patient developed no complications during the procedure. IMPRESSIONS  1. Left ventricular ejection fraction, by estimation, is 60 to 65%. The left ventricle has normal function.  2. Right ventricular systolic function is normal. The right ventricular size is normal.  3. No left atrial/left atrial appendage thrombus was detected.  4. The mitral valve is grossly normal. Trivial mitral valve regurgitation.  5. The aortic valve is tricuspid. Aortic valve regurgitation is not visualized.  6. Descending aorta without significant atherosclerosis.  7. No valvular vegetations. FINDINGS  Left Ventricle: Left ventricular ejection fraction, by estimation, is 60 to 65%. The left ventricle has normal function. The left ventricular internal cavity size was normal in size. Right Ventricle: The right ventricular size is normal. Right vetricular wall thickness was not  assessed. Right ventricular systolic function is normal. Left Atrium: Left atrial size was normal in size. No left atrial/left atrial appendage thrombus was detected. Right Atrium: Right atrial size was normal in size. Pericardium: There is no evidence of pericardial effusion. Mitral Valve: The mitral valve is grossly normal. Trivial mitral valve regurgitation. Tricuspid Valve: The tricuspid valve is grossly normal. Tricuspid valve regurgitation is trivial. Aortic Valve: The aortic valve is tricuspid. Aortic valve regurgitation is not visualized. Pulmonic Valve: The pulmonic valve was grossly normal. Pulmonic valve regurgitation is trivial. Aorta: Descending aorta without significant atherosclerosis. The aortic root is normal in size and structure. IAS/Shunts: No atrial level shunt detected by color flow Doppler. Nona Dell MD Electronically signed by Nona Dell MD Signature Date/Time: 01/02/2021/10:41:05 AM    Final    [place] EKG SITE RITE  Result Date: 01/09/2021 If Site Rite image not attached, placement could not be confirmed due to current cardiac rhythm.   Subjective:   Patient was seen and examined 01/15/2021, 8:42 AM Patient stable today. No acute distress.  No issues overnight Stable for discharge.  Discharge Exam:    Vitals:   01/13/21 2059 01/14/21 0526 01/14/21 1327 01/14/21 2018  BP: 120/86 107/69 104/70 107/73  Pulse: (!) 105 79 79 72  Resp: Temp: 98 F (36.7 C) 98.8 F (37.1 C) 98.1 F (36.7 C) 98.3 F (36.8 C)  TempSrc:   Oral   SpO2: 99% 92% 99% 96%  Weight:      Height:        General: Pt lying comfortably in bed & appears in no obvious distress. Cardiovascular: S1 & S2 heard, RRR, S1/S2 +. No murmurs, rubs, gallops or clicks. No JVD or pedal edema. Respiratory: Clear to auscultation without wheezing, rhonchi or crackles. No increased work of breathing. Abdominal:  Non-distended, non-tender & soft. No organomegaly or masses appreciated. Normal  bowel sounds heard. CNS: Alert and oriented. No focal deficits. Extremities: no edema, no cyanosis      The results of significant diagnostics from this hospitalization (including imaging, microbiology, ancillary and laboratory) are listed below for reference.      Microbiology:   Recent Results (from the past 240 hour(s))  Culture, blood (Routine X 2) w Reflex to ID Panel     Status: None   Collection Time: 01/06/21 11:07 AM   Specimen: BLOOD  Result Value Ref Range  Status   Specimen Description BLOOD  Final   Special Requests NONE  Final   Culture   Final    NO GROWTH 5 DAYS Performed at [facility], [address], [place], [place] [address]    Report Status 01/11/2021 FINAL  Final  Culture, blood (Routine X 2) w Reflex to ID Panel     Status: None   Collection Time: 01/06/21 11:07 AM   Specimen: BLOOD  Result Value Ref Range Status   Specimen Description BLOOD BLOOD RIGHT HAND  Final   Special Requests   Final    BOTTLES DRAWN AEROBIC AND ANAEROBIC Blood Culture adequate volume   Culture   Final    NO GROWTH 5 DAYS Performed at [facility], [address], [place], [place] [address]    Report Status 01/11/2021 FINAL  Final     Labs:   CBC: Recent Labs  Lab 01/09/21 0403 01/10/21 0449 01/11/21 0549 01/13/21 0415 01/15/21 0528  WBC 9.2 7.7 7.0 7.1 7.1  NEUTROABS 4.5 3.2 3.3  --   --   HGB 12.1* 13.2 12.5* 12.2* 12.6*  HCT 37.8* 41.6 38.9* 38.5* 39.7  MCV 90.9 93.1 91.3 92.1 93.2  PLT 631* 620* 584* 552* 471*   Basic Metabolic Panel: Recent Labs  Lab 01/09/21 0403 01/10/21 0449 01/11/21 0549 01/13/21 0415 01/15/21 0528  NA 134* 134* 134* 136 137  K 4.0 4.7 3.7 3.8 3.6  CL 96* 97* 97* 98 97*  CO2 30 30 30 29 31   GLUCOSE 105* 103* 107* 96 108*  BUN 10 10 8 10 9   CREATININE 0.63 0.62 0.56* 0.55* 0.64  CALCIUM 8.5* 8.6* 8.5* 8.7* 8.9   Liver Function Tests: Recent Labs  Lab 01/09/21 0403 01/10/21 0449 01/11/21 0549 01/13/21 0415  AST 79*  61* 40 34  ALT 112* 98* 80* 64*  ALKPHOS 77 76 72 76  BILITOT 0.4 0.8 0.3 0.5  PROT 7.6 7.8 7.5 7.9  ALBUMIN 2.4* 2.5* 2.5* 2.7*   BNP (last 3 results) No results for input(s): BNP in the last 8760 hours. Cardiac Enzymes: No results for input(s): CKTOTAL, CKMB, CKMBINDEX, TROPONINI in the last 168 hours. CBG: No results for input(s): GLUCAP in the last 168 hours. Hgb A1c No results for input(s): HGBA1C in the last 72 hours. Lipid Profile No results for input(s): CHOL, HDL, LDLCALC, TRIG, CHOLHDL, LDLDIRECT in the last 72 hours. Thyroid function studies No results for input(s): TSH, T4TOTAL, T3FREE, THYROIDAB in the last 72 hours.  Invalid input(s): FREET3 Anemia work up No results for input(s): VITAMINB12, FOLATE, FERRITIN, TIBC, IRON, RETICCTPCT in the last 72 hours. Urinalysis    Component Value Date/Time   COLORURINE YELLOW 01/06/2021 0715   APPEARANCEUR HAZY (A) 01/06/2021 0715   LABSPEC 1.010 01/06/2021 0715   PHURINE 6.0 01/06/2021 0715   GLUCOSEU NEGATIVE 01/06/2021 0715   HGBUR NEGATIVE 01/06/2021 0715   BILIRUBINUR NEGATIVE 01/06/2021 0715   KETONESUR NEGATIVE 01/06/2021 0715   PROTEINUR NEGATIVE 01/06/2021 0715   UROBILINOGEN 1.0 08/04/2014 2225   NITRITE NEGATIVE 01/06/2021 0715   LEUKOCYTESUR NEGATIVE 01/06/2021 0715         Time coordinating discharge: Over 45 minutes  SIGNED: 01/08/2021, MD, FACP, FHM. [facility],  Please use [email or web address] to Page If 7PM-7AM, please contact night-coverage [email or web address] [facility] 01/15/2021, 8:42 AM

## 2021-01-15 NOTE — Discharge Planning (Signed)
  Jan 15, 2021  Patient: George Lucas  Date of Birth: 1991-04-14  Date of Visit: 12/28/2020    To Whom It May Concern:  George Lucas was seen and treated at [facility] from 12/28/2020 to 01/15/2021. George Lucas  may return to work on 01/20/2021.  Sincerely,   Dr. Flossie Dibble

## 2021-01-16 ENCOUNTER — Other Ambulatory Visit (HOSPITAL_COMMUNITY): Payer: Self-pay

## 2021-01-29 ENCOUNTER — Other Ambulatory Visit: Payer: Self-pay

## 2021-01-29 ENCOUNTER — Other Ambulatory Visit (HOSPITAL_COMMUNITY): Payer: Self-pay

## 2021-01-29 ENCOUNTER — Ambulatory Visit (INDEPENDENT_AMBULATORY_CARE_PROVIDER_SITE_OTHER): Payer: Medicaid Other | Admitting: Infectious Disease

## 2021-01-29 VITALS — BP 110/74 | HR 56 | Wt 178.6 lb

## 2021-01-29 DIAGNOSIS — I76 Septic arterial embolism: Secondary | ICD-10-CM | POA: Diagnosis not present

## 2021-01-29 DIAGNOSIS — B182 Chronic viral hepatitis C: Secondary | ICD-10-CM | POA: Diagnosis not present

## 2021-01-29 DIAGNOSIS — J189 Pneumonia, unspecified organism: Secondary | ICD-10-CM | POA: Diagnosis not present

## 2021-01-29 DIAGNOSIS — R7881 Bacteremia: Secondary | ICD-10-CM | POA: Diagnosis not present

## 2021-01-29 NOTE — Progress Notes (Signed)
Subjective:  Reason for infectious disease Consult: MRSA bacteremia with endocarditis  Requesting Physician: Dr. Mariea Clonts   Patient ID: George Lucas, male    DOB: 04/14/1991, 30 y.o.   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  He did undergo transesophageal echocardiogram which at the time of TEE failed to show any evidence of vegetations though I have no doubt he had them before before they embolized to his lungs.  He completed several weeks of IV antibiotics in the hospital and has been discharged on doxycycline to complete a total of 6 weeks of therapy.  Is been established with Suboxone clinic for his opiate abuse.  He has stayed clean since then his wife has also been referred to our clinic for treatment of her chronic hepatitis C infection.  He denies alcohol use and is stopped all other drugs at this point in time.  He has a very open eye-opening experience for him and he does not want to go back to using intravenous drugs.  He is quite eager to have his hepatitis C cured as well.  Still does suffer from some pleuritic chest pain.   Past Medical History:  Diagnosis Date   Hepatitis C infection 01/21/15   Seizures (HCC)     Past Surgical History:  Procedure Laterality Date   FRACTURE SURGERY     HERNIA REPAIR     TEE WITHOUT CARDIOVERSION N/A 01/02/2021   Procedure: TRANSESOPHAGEAL ECHOCARDIOGRAM  (TEE) WITH PROPOFOL;  Surgeon: Jonelle Sidle, MD;  Location: [facility];  Service: Cardiovascular;  Laterality: N/A;    Family History  Problem Relation Age of Onset   Seizures Mother    Seizures Father       Social History   Socioeconomic History   Marital status: Single    Spouse name: Not on file   Number of children: Not  on file   Years of education: Not on file   Highest education level: Not on file  Occupational History   Not on file  Tobacco Use   Smoking status: Every Day    Packs/day: 0.50    Years: 10.00    Pack years: 5.00    Types: Cigarettes   Smokeless tobacco: Former    Quit date: 01/21/1999  Vaping Use   Vaping Use: Never used  Substance and Sexual Activity   Alcohol use: No   Drug use: No   Sexual activity: Yes    Birth control/protection: None  Other Topics Concern   Not on file  Social History Narrative   Not on file   Social Determinants of Health   Financial Resource Strain: Not on file  Food Insecurity: Not on file  Transportation Needs: Not on file  Physical Activity: Not on file  Stress: Not on file  Social Connections: Not on file    Allergies  Allergen Reactions   Sulfa Antibiotics Anaphylaxis and Rash     Current Outpatient Medications:    acetaminophen (TYLENOL) 325 MG tablet, Take 2 tablets (650 mg total) by mouth every 6 (six) hours as needed for mild pain (or Fever >/= 101)., Disp: 30 tablet, Rfl: 0   acidophilus (RISAQUAD) CAPS capsule, Take 1 capsule by mouth 3 (three) times daily with meals., Disp: 90  capsule, Rfl: 0   buprenorphine-naloxone (SUBOXONE) 8-2 mg SUBL SL tablet, Place 1 tablet under the tongue 2 (two) times daily. (Patient not taking: Reported on 12/28/2020), Disp: , Rfl:    cholecalciferol (VITAMIN D) 25 MCG tablet, Take 2 tablets (2,000 Units total) by mouth daily., Disp: 60 tablet, Rfl: 0   doxycycline (VIBRA-TABS) 100 MG tablet, Take 1 tablet (100 mg total) by mouth 2 (two) times daily for 28 days., Disp: 56 tablet, Rfl: 0   ibuprofen (ADVIL,MOTRIN) 600 MG tablet, Take 1 tablet (600 mg total) by mouth every 6 (six) hours as needed. (Patient taking differently: Take 200 mg by mouth every 6 (six) hours as needed for moderate pain, mild pain or headache.), Disp: 30 tablet, Rfl: 0   nicotine (NICODERM CQ - DOSED IN MG/24 HOURS) 21 mg/24hr patch,  Place 1 patch (21 mg total) onto the skin daily., Disp: 28 patch, Rfl: 0   nystatin ointment (MYCOSTATIN), Apply topically 2 (two) times daily., Disp: 30 g, Rfl: 0   pantoprazole (PROTONIX) 40 MG tablet, Take 1 tablet (40 mg total) by mouth every evening., Disp: 30 tablet, Rfl: 0   PROAIR HFA 108 (90 Base) MCG/ACT inhaler, Inhale 2 puffs into the lungs every 6 (six) hours as needed., Disp: , Rfl:    triamcinolone ointment (KENALOG) 0.1 %, Apply topically 2 (two) times daily., Disp: 30 g, Rfl: 0 No current facility-administered medications for this visit.  Facility-Administered Medications Ordered in Other Visits:    lactated ringers infusion, , Intravenous, Continuous PRN, Julian Reil, CRNA, New Bag at 12/30/20 1312   Review of Systems  Constitutional:  Negative for chills and fever.  HENT:  Negative for congestion and sore throat.   Eyes:  Negative for photophobia.  Respiratory:  Negative for cough, shortness of breath and wheezing.   Cardiovascular:  Positive for chest pain. Negative for palpitations and leg swelling.  Gastrointestinal:  Negative for abdominal pain, blood in stool, constipation, diarrhea, nausea and vomiting.  Genitourinary:  Negative for dysuria, flank pain and hematuria.  Musculoskeletal:  Negative for back pain and myalgias.  Skin:  Negative for rash.  Neurological:  Negative for dizziness, weakness and headaches.  Hematological:  Does not bruise/bleed easily.  Psychiatric/Behavioral:  Negative for agitation, behavioral problems, confusion, dysphoric mood, hallucinations and suicidal ideas. The patient is not nervous/anxious and is not hyperactive.       Objective:   Physical Exam Constitutional:      General: He is not in acute distress.    Appearance: Normal appearance. He is well-developed. He is not ill-appearing or diaphoretic.  HENT:     Head: Normocephalic and atraumatic.     Right Ear: Hearing and external ear normal.     Left Ear: Hearing and  external ear normal.     Nose: No nasal deformity or rhinorrhea.  Eyes:     General: No scleral icterus.    Extraocular Movements: Extraocular movements intact.     Conjunctiva/sclera: Conjunctivae normal.     Right eye: Right conjunctiva is not injected.     Left eye: Left conjunctiva is not injected.     Pupils: Pupils are equal, round, and reactive to light.  Neck:     Vascular: No JVD.  Cardiovascular:     Rate and Rhythm: Normal rate and regular rhythm.     Pulses: Normal pulses.     Heart sounds: Normal heart sounds, S1 normal and S2 normal. No murmur heard.   No friction rub. No  gallop.  Pulmonary:     Effort: Pulmonary effort is normal. No respiratory distress.     Breath sounds: Normal breath sounds. No stridor. No wheezing or rhonchi.  Abdominal:     General: Bowel sounds are normal. There is no distension.     Palpations: Abdomen is soft.     Tenderness: There is no abdominal tenderness.  Musculoskeletal:        General: Normal range of motion.     Right shoulder: Normal.     Left shoulder: Normal.     Cervical back: Normal range of motion and neck supple.     Right hip: Normal.     Left hip: Normal.     Right knee: Normal.     Left knee: Normal.  Lymphadenopathy:     Head:     Right side of head: No submandibular, preauricular or posterior auricular adenopathy.     Left side of head: No submandibular, preauricular or posterior auricular adenopathy.     Cervical: No cervical adenopathy.     Right cervical: No superficial or deep cervical adenopathy.    Left cervical: No superficial or deep cervical adenopathy.  Skin:    General: Skin is warm and dry.     Coloration: Skin is not pale.     Findings: No abrasion, bruising, ecchymosis, erythema, lesion or rash.     Nails: There is no clubbing.  Neurological:     General: No focal deficit present.     Mental Status: He is alert and oriented to person, place, and time.     Sensory: No sensory deficit.      Coordination: Coordination normal.     Gait: Gait normal.  Psychiatric:        Attention and Perception: He is attentive.        Mood and Affect: Mood normal.        Speech: Speech normal.        Behavior: Behavior normal. Behavior is cooperative.        Thought Content: Thought content normal.        Judgment: Judgment normal.          Assessment & Plan:   MSSA bacteremia with tricuspid valve endocarditis and septic embolization to the lungs:  Complete course of doxycycline and then afterwards we can check some surveillance blood cultures.  Chronic hepatitis C without hepatic coma: Check hepatitis A total antibody hepatitis B surface antibody titer check hepatitis C FibroSure and refer for elastography.  I will see him back in July to initiate treatment for his hepatitis C.  IVDU on box own.  I spent more than 80 minutes with the patient including greater than 50% of time in face to face counseling of the patient personally reviewing radiographs, along with pertinent laboratory microbiological data review of medical records and in coordination of his care.

## 2021-01-30 LAB — CBC WITH DIFFERENTIAL/PLATELET
Absolute Monocytes: 616 cells/uL (ref 200–950)
Basophils Absolute: 70 cells/uL (ref 0–200)
Basophils Relative: 1 %
Eosinophils Absolute: 539 cells/uL — ABNORMAL HIGH (ref 15–500)
Eosinophils Relative: 7.7 %
HCT: 43.2 % (ref 38.5–50.0)
Hemoglobin: 13.9 g/dL (ref 13.2–17.1)
Lymphs Abs: 3136 cells/uL (ref 850–3900)
MCH: 28.6 pg (ref 27.0–33.0)
MCHC: 32.2 g/dL (ref 32.0–36.0)
MCV: 88.9 fL (ref 80.0–100.0)
MPV: 11.2 fL (ref 7.5–12.5)
Monocytes Relative: 8.8 %
Neutro Abs: 2639 cells/uL (ref 1500–7800)
Neutrophils Relative %: 37.7 %
Platelets: 266 10*3/uL (ref 140–400)
RBC: 4.86 10*6/uL (ref 4.20–5.80)
RDW: 12.6 % (ref 11.0–15.0)
Total Lymphocyte: 44.8 %
WBC: 7 10*3/uL (ref 3.8–10.8)

## 2021-01-30 LAB — COMPLETE METABOLIC PANEL WITH GFR
AG Ratio: 1 (calc) (ref 1.0–2.5)
ALT: 50 U/L — ABNORMAL HIGH (ref 9–46)
AST: 43 U/L — ABNORMAL HIGH (ref 10–40)
Albumin: 3.7 g/dL (ref 3.6–5.1)
Alkaline phosphatase (APISO): 86 U/L (ref 36–130)
BUN: 8 mg/dL (ref 7–25)
CO2: 31 mmol/L (ref 20–32)
Calcium: 9.2 mg/dL (ref 8.6–10.3)
Chloride: 102 mmol/L (ref 98–110)
Creat: 0.68 mg/dL (ref 0.60–1.35)
GFR, Est African American: 150 mL/min/{1.73_m2} (ref >=60)
GFR, Est Non African American: 129 mL/min/{1.73_m2} (ref >=60)
Globulin: 3.7 g/dL (calc) (ref 1.9–3.7)
Glucose, Bld: 85 mg/dL (ref 65–99)
Potassium: 3.8 mmol/L (ref 3.5–5.3)
Sodium: 139 mmol/L (ref 135–146)
Total Bilirubin: 0.3 mg/dL (ref 0.2–1.2)
Total Protein: 7.4 g/dL (ref 6.1–8.1)

## 2021-01-30 LAB — C-REACTIVE PROTEIN: CRP: 4.9 mg/L (ref <8.0)

## 2021-01-30 LAB — SEDIMENTATION RATE: Sed Rate: 9 mm/h (ref 0–15)

## 2021-01-30 LAB — HEPATITIS B SURFACE ANTIBODY, QUANTITATIVE: Hep B S AB Quant (Post): <5 m[IU]/mL — ABNORMAL LOW (ref >=10)

## 2021-01-30 LAB — HEPATITIS A ANTIBODY, TOTAL: Hepatitis A AB,Total: BORDERLINE — AB

## 2021-02-03 LAB — HCV FIBROSURE
ALPHA 2-MACROGLOBULINS, QN: 202 mg/dL (ref 110–276)
ALT (SGPT) P5P: 63 IU/L — ABNORMAL HIGH (ref 0–55)
Apolipoprotein A-1: 111 mg/dL (ref 101–178)
Bilirubin, Total: 0.2 mg/dL (ref 0.0–1.2)
Fibrosis Score: 0.09 (ref 0.00–0.21)
GGT: 19 IU/L (ref 0–65)
Haptoglobin: 147 mg/dL (ref 17–317)
Necroinflammat Activity Score: 0.3 — ABNORMAL HIGH (ref 0.00–0.17)

## 2021-02-16 ENCOUNTER — Inpatient Hospital Stay: Admission: RE | Admit: 2021-02-16 | Payer: Medicaid Other | Source: Ambulatory Visit

## 2021-02-26 ENCOUNTER — Ambulatory Visit: Payer: Medicaid Other | Admitting: Infectious Disease

## 2021-09-24 ENCOUNTER — Other Ambulatory Visit: Payer: Self-pay

## 2021-09-24 ENCOUNTER — Ambulatory Visit
Admission: EM | Admit: 2021-09-24 | Discharge: 2021-09-24 | Disposition: A | Discharge status: Home / Self Care | Payer: Commercial Managed Care - PPO | Attending: Family Medicine | Admitting: Family Medicine

## 2021-09-24 DIAGNOSIS — J069 Acute upper respiratory infection, unspecified: Secondary | ICD-10-CM

## 2021-09-24 DIAGNOSIS — R52 Pain, unspecified: Secondary | ICD-10-CM | POA: Diagnosis not present

## 2021-09-24 DIAGNOSIS — R112 Nausea with vomiting, unspecified: Secondary | ICD-10-CM

## 2021-09-24 DIAGNOSIS — R509 Fever, unspecified: Secondary | ICD-10-CM | POA: Diagnosis not present

## 2021-09-24 DIAGNOSIS — R0789 Other chest pain: Secondary | ICD-10-CM

## 2021-09-24 DIAGNOSIS — Z9189 Other specified personal risk factors, not elsewhere classified: Secondary | ICD-10-CM

## 2021-09-24 MED ORDER — PROMETHAZINE-DM 6.25-15 MG/5ML PO SYRP: 5.0000 mL | ORAL_SOLUTION | Freq: Four times a day (QID) | ORAL | 0 refills | Status: AC | PRN

## 2021-09-24 MED ORDER — ALBUTEROL SULFATE HFA 108 (90 BASE) MCG/ACT IN AERS: 2.0000 | INHALATION_SPRAY | Freq: Four times a day (QID) | RESPIRATORY_TRACT | 0 refills | Status: AC | PRN

## 2021-09-24 MED ORDER — ONDANSETRON 4 MG PO TBDP: 4.0000 mg | ORAL_TABLET | Freq: Three times a day (TID) | ORAL | 0 refills | Status: AC | PRN

## 2021-09-24 NOTE — ED Provider Notes (Signed)
[facility]    CSN: [id number] Arrival date & time: 09/24/21  1257      History   Chief Complaint Chief Complaint  Patient presents with   Cough    Fever, cough, hoarse and SOB    HPI George Lucas is a 31 y.o. male.   Presenting today with 2-day history of fever, chills, body aches, cough, chest tightness, nasal congestion, nausea, vomiting.  Denies diarrhea, abdominal pain, chest pain, sore throat.  So far taking ibuprofen with minimal relief.  Recent COVID-positive exposure.  No known chronic pulmonary disease.   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   Opiate Dependence/IVDU  01/10/2021   Hepatitis-C----IVDU 01/10/2021   Infection of skin 01/06/2021   MRSA Bacteremia and Sepsis and IVDU    MRSA Multifocal pneumonia 12/28/2020   MRSA Severe Sepsis ([place]) 12/28/2020   MRSA Septic Embolism /IVDU 12/28/2020   Acute hyponatremia 12/28/2020    Past Surgical History:  Procedure Laterality Date   FRACTURE SURGERY     HERNIA REPAIR     TEE WITHOUT CARDIOVERSION N/A 01/02/2021   Procedure: TRANSESOPHAGEAL ECHOCARDIOGRAM  (TEE) WITH PROPOFOL;  Surgeon: Satira Sark, MD;  Location: [facility];  Service: Cardiovascular;  Laterality: N/A;       Home Medications    Prior to Admission medications   Medication Sig Start Date End Date Taking? Authorizing Provider  ondansetron (ZOFRAN-ODT) 4 MG disintegrating tablet Take 1 tablet (4 mg total) by mouth every 8 (eight) hours as needed for nausea or vomiting. 09/24/21  Yes Volney American, PA-C  promethazine-dextromethorphan (PROMETHAZINE-DM) 6.25-15 MG/5ML syrup Take 5 mLs by mouth 4 (four) times daily as needed for cough. 09/24/21  Yes Volney American, PA-C  albuterol [facility]) 108 [phone] Base) MCG/ACT inhaler Inhale 2  puffs into the lungs every 6 (six) hours as needed. 09/24/21   Volney American, PA-C  buprenorphine-naloxone (SUBOXONE) 8-2 mg SUBL SL tablet Place 1 tablet under the tongue 2 (two) times daily. 12/18/20   [provider]  ibuprofen (ADVIL,MOTRIN) 600 MG tablet Take 1 tablet (600 mg total) by mouth every 6 (six) hours as needed. Patient taking differently: Take 200 mg by mouth every 6 (six) hours as needed for moderate pain, mild pain or headache. 03/11/15   Idol, Almyra Free, PA-C  nicotine (NICODERM CQ - DOSED IN MG/24 HOURS) 21 mg/24hr patch Place 1 patch (21 mg total) onto the skin daily. 01/16/21   Shahmehdi, Valeria Batman, MD  nystatin ointment (MYCOSTATIN) Apply topically 2 (two) times daily. 01/15/21   Deatra James, MD  pantoprazole (PROTONIX) 40 MG tablet Take 1 tablet (40 mg total) by mouth every evening. 01/15/21 02/14/21  Shahmehdi, Valeria Batman, MD  triamcinolone ointment (KENALOG) 0.1 % Apply topically 2 (two) times daily. 01/15/21   Deatra James, MD    Family History Family History  Problem Relation Age of Onset   Seizures Mother    Seizures Father     Social History Social History   Tobacco Use   Smoking status: Every Day    Packs/day: 0.50    Years: 10.00    Pack years: 5.00    Types: Cigarettes   Smokeless tobacco: Former    Quit date: 01/21/1999  Vaping Use   Vaping Use: Some days  Substances: Nicotine, Flavoring  Substance Use Topics   Alcohol use: No   Drug use: No     Allergies   Sulfa antibiotics   Review of Systems Review of Systems Per HPI  Physical Exam Triage Vital Signs ED Triage Vitals  Enc Vitals Group     BP 09/24/21 1325 (!) 148/81     Pulse Rate 09/24/21 1325 68     Resp 09/24/21 1325 20     Temp 09/24/21 1325 98.2 F (36.8 C)     Temp Source 09/24/21 1325 Oral     SpO2 09/24/21 1325 97 %     Weight --      Height --      Head Circumference --      Peak Flow --      Pain Score 09/24/21 1326 0     Pain Loc --      Pain  Edu? --      Excl. in [place]? --    No data found.  Updated Vital Signs BP (!) 148/81 (BP Location: Right Arm)    Pulse 68    Temp 98.2 F (36.8 C) (Oral)    Resp 20    SpO2 97%   Visual Acuity Right Eye Distance:   Left Eye Distance:   Bilateral Distance:    Right Eye Near:   Left Eye Near:    Bilateral Near:     Physical Exam Vitals and nursing note reviewed.  Constitutional:      Appearance: He is well-developed.  HENT:     Head: Atraumatic.     Right Ear: External ear normal.     Left Ear: External ear normal.     Nose: Rhinorrhea present.     Mouth/Throat:     Mouth: Mucous membranes are moist.     Pharynx: Posterior oropharyngeal erythema present. No oropharyngeal exudate.  Eyes:     Conjunctiva/sclera: Conjunctivae normal.     Pupils: Pupils are equal, round, and reactive to light.  Cardiovascular:     Rate and Rhythm: Normal rate and regular rhythm.     Heart sounds: Normal heart sounds.  Pulmonary:     Effort: Pulmonary effort is normal. No respiratory distress.     Breath sounds: No wheezing or rales.  Abdominal:     General: Bowel sounds are normal. There is no distension.     Palpations: Abdomen is soft.     Tenderness: There is no abdominal tenderness. There is no guarding.  Musculoskeletal:        General: Normal range of motion.     Cervical back: Normal range of motion and neck supple.  Lymphadenopathy:     Cervical: No cervical adenopathy.  Skin:    General: Skin is warm and dry.  Neurological:     Mental Status: He is alert and oriented to person, place, and time.  Psychiatric:        Mood and Affect: Mood normal.        Behavior: Behavior normal.        Thought Content: Thought content normal.        Judgment: Judgment normal.     UC Treatments / Results  Labs (all labs ordered are listed, but only abnormal results are displayed) Labs Reviewed  COVID-19, FLU A+B NAA    EKG   Radiology No results found.  Procedures Procedures  (including critical care time)  Medications Ordered in UC Medications - No data to display  Initial Impression /  Assessment and Plan / UC Course  I have reviewed the triage vital signs and the nursing notes.  Pertinent labs & imaging results that were available during my care of the patient were reviewed by me and considered in my medical decision making (see chart for details).     Vital signs overall reassuring today, suspect viral illness possibly COVID-19.  COVID, flu testing pending, will treat symptomatically with albuterol inhaler, Phenergan DM, Zofran for as needed use.  Discussed supportive over-the-counter medications and home care additionally.  Work note given.  Return for acutely worsening symptoms.  Final Clinical Impressions(s) / UC Diagnoses   Final diagnoses:  At increased risk of exposure to COVID-19 virus  Viral URI with cough  Fever, unspecified  Generalized body aches  Chest tightness  Nausea and vomiting, unspecified vomiting type   Discharge Instructions   None    ED Prescriptions     Medication Sig Dispense Auth. Provider   albuterol (PROAIR HFA) 108 (90 Base) MCG/ACT inhaler Inhale 2 puffs into the lungs every 6 (six) hours as needed. [place], [place]   promethazine-dextromethorphan (PROMETHAZINE-DM) 6.25-15 MG/5ML syrup Take 5 mLs by mouth 4 (four) times daily as needed for cough. 118 mL Volney American, PA-C   ondansetron (ZOFRAN-ODT) 4 MG disintegrating tablet Take 1 tablet (4 mg total) by mouth every 8 (eight) hours as needed for nausea or vomiting. 20 tablet Volney American, [place]      PDMP not reviewed this encounter.   Volney American, [place] 09/24/21 1357

## 2021-09-24 NOTE — ED Triage Notes (Signed)
Patient states he has been out of work for 2 days because of cough, fever, and SOB  Patient states that he took Ibuprofen

## 2021-09-25 LAB — COVID-19, FLU A+B NAA
Influenza A, NAA: NOT DETECTED
Influenza B, NAA: NOT DETECTED
SARS-CoV-2, NAA: NOT DETECTED

## 2022-06-27 ENCOUNTER — Other Ambulatory Visit: Payer: Self-pay

## 2022-06-27 ENCOUNTER — Emergency Department (HOSPITAL_COMMUNITY)
Admission: EM | Admit: 2022-06-27 | Discharge: 2022-06-28 | Disposition: A | Discharge status: Home / Self Care | Payer: Medicaid Other | Attending: Emergency Medicine | Admitting: Emergency Medicine

## 2022-06-27 ENCOUNTER — Emergency Department (HOSPITAL_COMMUNITY): Payer: Medicaid Other

## 2022-06-27 ENCOUNTER — Encounter (HOSPITAL_COMMUNITY): Payer: Self-pay

## 2022-06-27 DIAGNOSIS — M79604 Pain in right leg: Secondary | ICD-10-CM | POA: Diagnosis not present

## 2022-06-27 DIAGNOSIS — J449 Chronic obstructive pulmonary disease, unspecified: Secondary | ICD-10-CM | POA: Insufficient documentation

## 2022-06-27 DIAGNOSIS — M545 Low back pain, unspecified: Secondary | ICD-10-CM | POA: Diagnosis not present

## 2022-06-27 DIAGNOSIS — R102 Pelvic and perineal pain: Secondary | ICD-10-CM | POA: Diagnosis present

## 2022-06-27 DIAGNOSIS — M79605 Pain in left leg: Secondary | ICD-10-CM | POA: Insufficient documentation

## 2022-06-27 DIAGNOSIS — M25552 Pain in left hip: Secondary | ICD-10-CM | POA: Diagnosis not present

## 2022-06-27 DIAGNOSIS — Z23 Encounter for immunization: Secondary | ICD-10-CM | POA: Diagnosis not present

## 2022-06-27 DIAGNOSIS — W19XXXA Unspecified fall, initial encounter: Secondary | ICD-10-CM

## 2022-06-27 LAB — TYPE AND SCREEN
ABO/RH(D): B POS
Antibody Screen: NEGATIVE

## 2022-06-27 LAB — PROTIME-INR
INR: 0.9 (ref 0.8–1.2)
Prothrombin Time: 12.3 seconds (ref 11.4–15.2)

## 2022-06-27 LAB — CBC
HCT: 47.2 % (ref 39.0–52.0)
Hemoglobin: 15.2 g/dL (ref 13.0–17.0)
MCH: 27.6 pg (ref 26.0–34.0)
MCHC: 32.2 g/dL (ref 30.0–36.0)
MCV: 85.7 fL (ref 80.0–100.0)
Platelets: 268 10*3/uL (ref 150–400)
RBC: 5.51 MIL/uL (ref 4.22–5.81)
RDW: 15.5 % (ref 11.5–15.5)
WBC: 10.9 10*3/uL — ABNORMAL HIGH (ref 4.0–10.5)
nRBC: 0 % (ref 0.0–0.2)

## 2022-06-27 LAB — COMPREHENSIVE METABOLIC PANEL
ALT: 68 U/L — ABNORMAL HIGH (ref 0–44)
AST: 51 U/L — ABNORMAL HIGH (ref 15–41)
Albumin: 3.7 g/dL (ref 3.5–5.0)
Alkaline Phosphatase: 83 U/L (ref 38–126)
Anion gap: 6 (ref 5–15)
BUN: 12 mg/dL (ref 6–20)
CO2: 32 mmol/L (ref 22–32)
Calcium: 8.7 mg/dL — ABNORMAL LOW (ref 8.9–10.3)
Chloride: 99 mmol/L (ref 98–111)
Creatinine, Ser: 0.78 mg/dL (ref 0.61–1.24)
GFR, Estimated: >60 mL/min (ref >60)
Glucose, Bld: 96 mg/dL (ref 70–99)
Potassium: 3.8 mmol/L (ref 3.5–5.1)
Sodium: 137 mmol/L (ref 135–145)
Total Bilirubin: 0.7 mg/dL (ref 0.3–1.2)
Total Protein: 7.5 g/dL (ref 6.5–8.1)

## 2022-06-27 LAB — ETHANOL: Alcohol, Ethyl (B): <10 mg/dL (ref <10)

## 2022-06-27 MED ORDER — SODIUM CHLORIDE 0.9 % IV BOLUS
1000.0000 mL | Freq: Once | INTRAVENOUS | Status: AC
  Administered 2022-06-27: 1000 mL via INTRAVENOUS

## 2022-06-27 MED ORDER — TETANUS-DIPHTH-ACELL PERTUSSIS 5-2.5-18.5 LF-MCG/0.5 IM SUSY
0.5000 mL | PREFILLED_SYRINGE | Freq: Once | INTRAMUSCULAR | Status: AC
  Administered 2022-06-28: 0.5 mL via INTRAMUSCULAR
  Filled 2022-06-27: qty 0.5

## 2022-06-27 MED ORDER — IBUPROFEN 800 MG PO TABS: 800.0000 mg | ORAL_TABLET | Freq: Three times a day (TID) | ORAL | 1 refills | Status: AC

## 2022-06-27 MED ORDER — IOHEXOL 300 MG/ML  SOLN
100.0000 mL | Freq: Once | INTRAMUSCULAR | Status: AC | PRN
  Administered 2022-06-27: 100 mL via INTRAVENOUS
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
## 2022-06-27 NOTE — ED Notes (Signed)
Patient returned to room from CT. 

## 2022-06-27 NOTE — Discharge Instructions (Signed)
Follow-up with Dr. Aline Brochure or one of his colleagues in next week.

## 2022-06-27 NOTE — ED Notes (Signed)
Patient transported to CT 

## 2022-10-31 ENCOUNTER — Emergency Department (HOSPITAL_COMMUNITY)
Admission: EM | Admit: 2022-10-31 | Discharge: 2022-10-31 | Disposition: A | Discharge status: Home / Self Care | Payer: Medicaid Other | Attending: Emergency Medicine | Admitting: Emergency Medicine

## 2022-10-31 ENCOUNTER — Encounter (HOSPITAL_COMMUNITY): Payer: Self-pay

## 2022-10-31 ENCOUNTER — Emergency Department (HOSPITAL_COMMUNITY): Payer: Medicaid Other

## 2022-10-31 DIAGNOSIS — R051 Acute cough: Secondary | ICD-10-CM | POA: Insufficient documentation

## 2022-10-31 DIAGNOSIS — Z87891 Personal history of nicotine dependence: Secondary | ICD-10-CM | POA: Insufficient documentation

## 2022-10-31 DIAGNOSIS — Z1152 Encounter for screening for COVID-19: Secondary | ICD-10-CM | POA: Insufficient documentation

## 2022-10-31 LAB — RESP PANEL BY RT-PCR (RSV, FLU A&B, COVID)  RVPGX2
Influenza A by PCR: NEGATIVE
Influenza B by PCR: NEGATIVE
Resp Syncytial Virus by PCR: NEGATIVE
SARS Coronavirus 2 by RT PCR: NEGATIVE

## 2022-10-31 NOTE — ED Triage Notes (Addendum)
Pt presents to ED with c/o cough and left rib pain x 4 days. Pt has tenderness to left ribs with palpation, guarding noted. Pt has pinpoint pupils, mumbling speech noted. Pt denies taking anything prior to arrival other than mucinex. No fever

## 2022-10-31 NOTE — ED Provider Notes (Signed)
[facility] Emergency Department Provider Note MRN:  [id number]  Arrival date & time: 10/31/22     Chief Complaint   Cough (Left rib pain)   History of Present Illness   George Lucas is a 32 y.o. year-old male with a history of substance use disorder, IV drug use presenting to the ED with chief complaint of cough.  Cough, rib pain for the past 2 or 3 days.  Thinks he might have pneumonia.  Review of Systems  A thorough review of systems was obtained and all systems are negative except as noted in the HPI and PMH.   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   MRSA Multifocal pneumonia 12/28/2020   MRSA Septic Embolism /IVDU 12/28/2020   MRSA Severe Sepsis ([place]) 12/28/2020   Opiate Dependence/IVDU  01/10/2021   Seizures ([place])     Past Surgical History:  Procedure Laterality Date   FRACTURE SURGERY     HERNIA REPAIR     TEE WITHOUT CARDIOVERSION N/A 01/02/2021   Procedure: TRANSESOPHAGEAL ECHOCARDIOGRAM  (TEE) WITH PROPOFOL;  Surgeon: Satira Sark, MD;  Location: [facility];  Service: Cardiovascular;  Laterality: N/A;    Family History  Problem Relation Age of Onset   Seizures Mother    Seizures Father     Social History   Socioeconomic History   Marital status: Single    Spouse name: Not on file   Number of children: Not on file   Years of education: Not on file   Highest education level: Not on file  Occupational History   Not on file  Tobacco Use   Smoking status: Every Day    Packs/day: 0.50    Years: 10.00    Total pack years: 5.00    Types: Cigarettes   Smokeless tobacco: Former    Quit date: 01/21/1999  Vaping Use   Vaping Use: Some days   Substances: Nicotine, Flavoring  Substance and Sexual Activity   Alcohol use: Not on file    Drug use: Not on file    Comment: hx of heroin/opiate abuse   Sexual activity: Yes    Birth control/protection: None  Other Topics Concern   Not on file  Social History Narrative   Not on file   Social Determinants of Health   Financial Resource Strain: Not on file  Food Insecurity: Not on file  Transportation Needs: Not on file  Physical Activity: Not on file  Stress: Not on file  Social Connections: Not on file  Intimate Partner Violence: Not on file     Physical Exam   Vitals:   10/31/22 0452 10/31/22 0500  BP: 129/86 (!) 126/94  Pulse: 85 83  Resp: 12 13  Temp:    SpO2: 96% 96%    CONSTITUTIONAL: Chronically ill-appearing, NAD NEURO/PSYCH:  Alert and oriented x 3, no focal deficits EYES:  eyes equal and reactive ENT/NECK:  no LAD, no JVD CARDIO: Regular rate, well-perfused, normal S1 and S2 PULM:  CTAB no wheezing or rhonchi GI/GU:  non-distended, non-tender MSK/SPINE:  No gross deformities, no edema SKIN:  no rash, atraumatic   *Additional and/or pertinent findings included in MDM below  Diagnostic and Interventional Summary    EKG Interpretation  Date/Time:  Sunday October 31 2022 04:50:54 EDT Ventricular Rate:  81 PR Interval:  147 QRS Duration: 81 QT Interval:  365 QTC Calculation: 424 R Axis:   82 Text Interpretation: Sinus rhythm Confirmed by Gerlene Fee [phone]) on 10/31/2022 6:09:31 AM       Labs Reviewed  RESP PANEL BY RT-PCR (RSV, FLU A&B, COVID)  [id number]    DG Chest [facility] 1 View  Final Result      Medications - No data to display   Procedures  /  Critical Care Procedures  ED Course and Medical Decision Making  Initial Impression and Ddx Patient appears a bit intoxicated, possibly opioid toxidrome given the small pupils.  Vital signs overall reassuring, blood pressure a bit soft.  No increased work of breathing, abdomen soft and nontender.  Differential diagnosis includes pneumothorax, pneumonia, flu, doubt PE.  Past  medical/surgical history that increases complexity of ED encounter: History of IV drug use  Interpretation of Diagnostics I personally reviewed the EKG and my interpretation is as follows: Sinus rhythm  Chest x-ray unremarkable  Patient Reassessment and Ultimate Disposition/Management     Patient seems more appropriate/sober on reassessment.  Claims that he is 60 days sober.  Ready to go home.  No emergent process, appropriate for discharge.  Patient management required discussion with the following services or consulting groups:  None  Complexity of Problems Addressed Acute complicated illness or Injury  Additional Data Reviewed and Analyzed Further history obtained from: None  Additional Factors Impacting ED Encounter Risk None  Barth Kirks. Sedonia Small, [place] [email or web address]  Final Clinical Impressions(s) / ED Diagnoses     ICD-10-CM   1. Acute cough  R05.1       ED Discharge Orders     None        Discharge Instructions Discussed with and Provided to Patient:     Discharge Instructions      You were evaluated in the Emergency Department and after careful evaluation, we did not find any emergent condition requiring admission or further testing in the hospital.  Your exam/testing today is overall reassuring.  Suspect upper respiratory infection from a virus.  Please return to the Emergency Department if you experience any worsening of your condition.   Thank you for allowing [place] to be a part of your care.       Maudie Flakes, MD 10/31/22 [phone]

## 2022-10-31 NOTE — Discharge Instructions (Signed)
You were evaluated in the Emergency Department and after careful evaluation, we did not find any emergent condition requiring admission or further testing in the hospital.  Your exam/testing today is overall reassuring.  Suspect upper respiratory infection from a virus.  Please return to the Emergency Department if you experience any worsening of your condition.   Thank you for allowing [place] to be a part of your care.

## 2023-05-04 ENCOUNTER — Emergency Department (HOSPITAL_COMMUNITY)
Admission: EM | Admit: 2023-05-04 | Discharge: 2023-05-04 | Payer: MEDICAID | Attending: Emergency Medicine | Admitting: Emergency Medicine

## 2023-05-04 ENCOUNTER — Encounter (HOSPITAL_COMMUNITY): Payer: Self-pay | Admitting: Emergency Medicine

## 2023-05-04 ENCOUNTER — Other Ambulatory Visit: Payer: Self-pay

## 2023-05-04 DIAGNOSIS — F1910 Other psychoactive substance abuse, uncomplicated: Secondary | ICD-10-CM | POA: Diagnosis present

## 2023-05-04 DIAGNOSIS — F17210 Nicotine dependence, cigarettes, uncomplicated: Secondary | ICD-10-CM | POA: Diagnosis not present

## 2023-05-04 NOTE — ED Provider Notes (Signed)
[place] EMERGENC[facility] DEPARTMENT AT [facility] Provider Note  CSN: [id number] Arrival date & time: 05/04/23 1922  Chief Complaint(s) [address] Clearance  HPI JEDAIAH SETLOCK is a 32 y.o. male with past medical history as below, significant for IV drug abuse, opiate dependence, heroin abuse, HCV, MRSA who presents to the ED with complaint of recent heroin use, medical clearance.  Patient reports that he injected IV fentanyl around lunchtime today.  It was less than his typical amount.  He reports he feels somewhat sleepy but otherwise feels normal.  No abdominal pain, chest pain, nausea or vomiting.  Fevers or chills.  Tolerant p.o. intake with difficulty.  Reports that he is in his normal state of health.  No SI or HI.  No hallucinations.  He has no medical complaints.  Medical clearance was requested by jail, he has no medical complaints  Past Medical History Past Medical History:  Diagnosis Date   Acute hyponatremia 12/28/2020   Bacteremia 01/10/2021   Hepatitis C infection 01/21/2015   Hepatitis-C----IVDU 01/10/2021   Heroin addiction ---IVDU 01/10/2021   MRSA Multifocal pneumonia 12/28/2020   MRSA Septic Embolism /IVDU 12/28/2020   MRSA Severe Sepsis (HCC) 12/28/2020   Opiate Dependence/IVDU  01/10/2021   Seizures (HCC)    Patient Active Problem List   Diagnosis Date Noted   Bacteremia 01/10/2021   MRSA bacteremia----IVDU 01/10/2021   Heroin addiction ---IVDU 01/10/2021   Opiate Dependence/IVDU  01/10/2021   Hepatitis-C----IVDU 01/10/2021   Infection of skin 01/06/2021   MRSA Multifocal pneumonia 12/28/2020   MRSA Severe Sepsis (HCC) 12/28/2020   MRSA Septic Embolism /IVDU 12/28/2020   Acute hyponatremia 12/28/2020   Home Medication(s) Prior to Admission medications   Medication Sig Start Date End Date Taking? Authorizing Provider  albuterol (PROAIR HFA) 108 (90 Base) MCG/ACT inhaler Inhale 2 puffs into the lungs every 6 (six) hours as needed. 09/24/21   Particia Nearing, PA-C  buprenorphine-naloxone (SUBOXONE) 8-2 mg SUBL SL tablet Place 1 tablet under the tongue 2 (two) times daily. 12/18/20   [provider]  ibuprofen (ADVIL) 800 MG tablet Take 1 tablet (800 mg total) by mouth 3 (three) times daily. 06/27/22   Bethann Berkshire, MD  nicotine (NICODERM CQ - DOSED IN MG/24 HOURS) 21 mg/24hr patch Place 1 patch (21 mg total) onto the skin daily. 01/16/21   Shahmehdi, Gemma Payor, MD  nystatin ointment (MYCOSTATIN) Apply topically 2 (two) times daily. 01/15/21   Shahmehdi, Gemma Payor, MD  ondansetron (ZOFRAN-ODT) 4 MG disintegrating tablet Take 1 tablet (4 mg total) by mouth every 8 (eight) hours as needed for nausea or vomiting. 09/24/21   Particia Nearing, PA-C  pantoprazole (PROTONIX) 40 MG tablet Take 1 tablet (40 mg total) by mouth every evening. 01/15/21 02/14/21  Kendell Bane, MD  promethazine-dextromethorphan (PROMETHAZINE-DM) 6.25-15 MG/5ML syrup Take 5 mLs by mouth 4 (four) times daily as needed for cough. 09/24/21   Particia Nearing, PA-C  triamcinolone ointment (KENALOG) 0.1 % Apply topically 2 (two) times daily. 01/15/21   Kendell Bane, MD  Past Surgical History Past Surgical History:  Procedure Laterality Date   FRACTURE SURGERY     HERNIA REPAIR     TEE WITHOUT CARDIOVERSION N/A 01/02/2021   Procedure: TRANSESOPHAGEAL ECHOCARDIOGRAM  (TEE) WITH PROPOFOL;  Surgeon: Jonelle Sidle, MD;  Location: [facility];  Service: Cardiovascular;  Laterality: N/A;   Family History Family History  Problem Relation Age of Onset   Seizures Mother    Seizures Father     Social History Social History   Tobacco Use   Smoking status: Every Day    Current packs/day: 0.50    Average packs/day: 0.5 packs/day for 10.0 years (5.0 ttl pk-yrs)    Types: Cigarettes   Smokeless tobacco: Former    Quit date:  01/21/1999  Vaping Use   Vaping status: Some Days   Substances: Nicotine, Flavoring   Allergies Sulfa antibiotics and Sulfur  Review of Systems Review of Systems  Constitutional:  Negative for chills and fever.  HENT:  Negative for facial swelling and trouble swallowing.   Eyes:  Negative for photophobia and visual disturbance.  Respiratory:  Negative for cough and shortness of breath.   Cardiovascular:  Negative for chest pain and palpitations.  Gastrointestinal:  Negative for abdominal pain, nausea and vomiting.  Endocrine: Negative for polydipsia and polyuria.  Genitourinary:  Negative for difficulty urinating and hematuria.  Musculoskeletal:  Negative for gait problem and joint swelling.  Skin:  Negative for pallor and rash.  Neurological:  Negative for syncope and headaches.  Psychiatric/Behavioral:  Negative for agitation and confusion.     Physical Exam Vital Signs  I have reviewed the triage vital signs BP 122/75 (BP Location: Left Arm)   Pulse 70   Temp 97.9 F (36.6 C) (Oral)   Resp 15   Ht 6\' 2"  (1.88 m)   Wt 81.6 kg   SpO2 100%   BMI 23.11 kg/m  Physical Exam Vitals and nursing note reviewed.  Constitutional:      General: He is not in acute distress.    Appearance: Normal appearance. He is well-developed.  HENT:     Head: Normocephalic and atraumatic.     Right Ear: External ear normal.     Left Ear: External ear normal.     Mouth/Throat:     Mouth: Mucous membranes are moist.  Eyes:     General: No scleral icterus. Cardiovascular:     Rate and Rhythm: Normal rate and regular rhythm.     Pulses: Normal pulses.     Heart sounds: Normal heart sounds.  Pulmonary:     Effort: Pulmonary effort is normal. No respiratory distress.     Breath sounds: Normal breath sounds.  Abdominal:     General: Abdomen is flat.     Palpations: Abdomen is soft.     Tenderness: There is no abdominal tenderness.  Musculoskeletal:     Cervical back: No rigidity.      Right lower leg: No edema.     Left lower leg: No edema.  Skin:    General: Skin is warm and dry.     Capillary Refill: Capillary refill takes less than 2 seconds.  Neurological:     Mental Status: He is alert.  Psychiatric:        Mood and Affect: Mood normal.        Behavior: Behavior normal.     ED Results and Treatments Labs (all labs ordered are listed, but only abnormal results are displayed) Labs Reviewed - No data  to display                                                                                                                        Radiology No results found.  Pertinent labs & imaging results that were available during my care of the patient were reviewed by me and considered in my medical decision making (see MDM for details).  Medications Ordered in ED Medications - No data to display                                                                                                                                   Procedures Procedures  (including critical care time)  Medical Decision Making / ED Course    Medical Decision Making:    NEJI SEITZ is a 32 y.o. male  with past medical history as below, significant for IV drug abuse, opiate dependence, heroin abuse, HCV, MRSA who presents to the ED with complaint of recent heroin use, medical clearance.. The complaint involves an extensive differential diagnosis and also carries with it a high risk of complications and morbidity.  Serious etiology was considered.   Complete initial physical exam performed, notably the patient  was no acute distress, sitting upright in chair, cooperative with exam, he has no complaints.    Reviewed and confirmed nursing documentation for past medical history, family history, social history.  Vital signs reviewed.        Patient is medically cleared at this time.  He has no medical complaints or concerns.  His exam is reassuring.  Vital signs stable.   Do not feel  labs/imaging is necessary at this time given above   I encourage patient to abstain from the use of fentanyl in the future and offered outpatient substance abuse resources   The patient improved significantly and was discharged in stable condition. Detailed discussions were had with the patient regarding current findings, and need for close f/u with PCP or on call doctor. The patient has been instructed to return immediately if the symptoms worsen in any way for re-evaluation. Patient verbalized understanding and is in agreement with current care plan. All questions answered prior to discharge.                   Additional history obtained: -Additional history obtained from PD -External records from outside source obtained and reviewed including: Chart review including previous  notes, labs, imaging, consultation notes including  Prior ED visits, prior labs and imaging, home medications   Lab Tests: na  EKG   EKG Interpretation Date/Time:    Ventricular Rate:    PR Interval:    QRS Duration:    QT Interval:    QTC Calculation:   R Axis:      Text Interpretation:           Imaging Studies ordered: na   Medicines ordered and prescription drug management: No orders of the defined types were placed in this encounter.   -I have reviewed the patients home medicines and have made adjustments as needed   Consultations Obtained: na   Cardiac Monitoring: Continuous pulse oximetry interpreted by myself, 99% on RA.    Social Determinants of Health:  Diagnosis or treatment significantly limited by social determinants of health: current smoker and polysubstance abuse   Reevaluation: After the interventions noted above, I reevaluated the patient and found that they have stayed the same  Co morbidities that complicate the patient evaluation  Past Medical History:  Diagnosis Date   Acute hyponatremia 12/28/2020   Bacteremia 01/10/2021   Hepatitis C  infection 01/21/2015   Hepatitis-C----IVDU 01/10/2021   Heroin addiction ---IVDU 01/10/2021   MRSA Multifocal pneumonia 12/28/2020   MRSA Septic Embolism /IVDU 12/28/2020   MRSA Severe Sepsis (HCC) 12/28/2020   Opiate Dependence/IVDU  01/10/2021   Seizures (HCC)       Dispostion: Disposition decision including need for hospitalization was considered, and patient discharged from emergency department.    Final Clinical Impression(s) / ED Diagnoses Final diagnoses:  IV drug abuse (HCC)        Sloan Leiter, DO 05/04/23 1938

## 2023-05-04 NOTE — ED Triage Notes (Signed)
[facility] due to injecting fentanyl about 6 hrs ago. Pt states he uses every day.

## 2023-05-04 NOTE — Discharge Instructions (Signed)
It was a pleasure caring for you today in the emergency department.  Please return to the emergency department for any worsening or worrisome symptoms.  Please consider cessation of fentanyl
